# Patient Record
Sex: Female | Born: 1975 | Race: White | Hispanic: No | Smoking: Current every day smoker
Health system: Southern US, Community
[De-identification: ages and names within clinical notes are randomized; demographics above are authoritative.]

## PROBLEM LIST (undated history)

## (undated) DIAGNOSIS — M5000 Cervical disc disorder with myelopathy, unspecified cervical region: Secondary | ICD-10-CM

## (undated) DIAGNOSIS — J45909 Unspecified asthma, uncomplicated: Secondary | ICD-10-CM

## (undated) DIAGNOSIS — F419 Anxiety disorder, unspecified: Secondary | ICD-10-CM

## (undated) DIAGNOSIS — N3281 Overactive bladder: Secondary | ICD-10-CM

## (undated) DIAGNOSIS — I10 Essential (primary) hypertension: Secondary | ICD-10-CM

## (undated) DIAGNOSIS — L732 Hidradenitis suppurativa: Secondary | ICD-10-CM

## (undated) DIAGNOSIS — E559 Vitamin D deficiency, unspecified: Secondary | ICD-10-CM

## (undated) DIAGNOSIS — Z720 Tobacco use: Secondary | ICD-10-CM

## (undated) DIAGNOSIS — F329 Major depressive disorder, single episode, unspecified: Secondary | ICD-10-CM

## (undated) DIAGNOSIS — E538 Deficiency of other specified B group vitamins: Secondary | ICD-10-CM

## (undated) DIAGNOSIS — J449 Chronic obstructive pulmonary disease, unspecified: Secondary | ICD-10-CM

## (undated) DIAGNOSIS — R0600 Dyspnea, unspecified: Secondary | ICD-10-CM

## (undated) DIAGNOSIS — E785 Hyperlipidemia, unspecified: Secondary | ICD-10-CM

## (undated) DIAGNOSIS — Z8719 Personal history of other diseases of the digestive system: Secondary | ICD-10-CM

## (undated) DIAGNOSIS — K589 Irritable bowel syndrome without diarrhea: Secondary | ICD-10-CM

## (undated) DIAGNOSIS — K219 Gastro-esophageal reflux disease without esophagitis: Secondary | ICD-10-CM

## (undated) DIAGNOSIS — F32A Depression, unspecified: Secondary | ICD-10-CM

## (undated) HISTORY — DX: Anxiety disorder, unspecified: F41.9

## (undated) HISTORY — PX: CHOLECYSTECTOMY: SHX55

## (undated) HISTORY — PX: TUBAL LIGATION: SHX77

---

## 2000-11-22 ENCOUNTER — Emergency Department (HOSPITAL_COMMUNITY): Admission: EM | Admit: 2000-11-22 | Discharge: 2000-11-22 | Payer: Self-pay | Admitting: *Deleted

## 2002-05-10 ENCOUNTER — Emergency Department (HOSPITAL_COMMUNITY): Admission: EM | Admit: 2002-05-10 | Discharge: 2002-05-10 | Payer: Self-pay | Admitting: *Deleted

## 2002-12-09 ENCOUNTER — Ambulatory Visit (HOSPITAL_COMMUNITY): Admission: RE | Admit: 2002-12-09 | Discharge: 2002-12-09 | Payer: Self-pay | Admitting: Obstetrics and Gynecology

## 2003-01-11 ENCOUNTER — Emergency Department (HOSPITAL_COMMUNITY): Admission: EM | Admit: 2003-01-11 | Discharge: 2003-01-11 | Payer: Self-pay | Admitting: *Deleted

## 2003-03-02 ENCOUNTER — Ambulatory Visit (HOSPITAL_COMMUNITY): Admission: RE | Admit: 2003-03-02 | Discharge: 2003-03-02 | Payer: Self-pay | Admitting: Orthopaedic Surgery

## 2003-03-02 ENCOUNTER — Encounter: Payer: Self-pay | Admitting: Orthopaedic Surgery

## 2004-08-09 ENCOUNTER — Emergency Department (HOSPITAL_COMMUNITY): Admission: EM | Admit: 2004-08-09 | Discharge: 2004-08-09 | Payer: Self-pay | Admitting: Emergency Medicine

## 2004-10-02 ENCOUNTER — Emergency Department (HOSPITAL_COMMUNITY): Admission: EM | Admit: 2004-10-02 | Discharge: 2004-10-03 | Payer: Self-pay | Admitting: Emergency Medicine

## 2004-10-16 ENCOUNTER — Ambulatory Visit (HOSPITAL_COMMUNITY): Admission: RE | Admit: 2004-10-16 | Discharge: 2004-10-16 | Payer: Self-pay | Admitting: General Surgery

## 2006-02-02 ENCOUNTER — Emergency Department (HOSPITAL_COMMUNITY): Admission: EM | Admit: 2006-02-02 | Discharge: 2006-02-02 | Payer: Self-pay | Admitting: Emergency Medicine

## 2006-02-10 ENCOUNTER — Ambulatory Visit: Payer: Self-pay | Admitting: Gastroenterology

## 2006-02-13 ENCOUNTER — Ambulatory Visit (HOSPITAL_COMMUNITY): Admission: RE | Admit: 2006-02-13 | Discharge: 2006-02-13 | Payer: Self-pay | Admitting: Gastroenterology

## 2006-02-13 ENCOUNTER — Encounter (INDEPENDENT_AMBULATORY_CARE_PROVIDER_SITE_OTHER): Payer: Self-pay | Admitting: *Deleted

## 2006-02-13 ENCOUNTER — Ambulatory Visit: Payer: Self-pay | Admitting: Gastroenterology

## 2006-10-08 ENCOUNTER — Emergency Department (HOSPITAL_COMMUNITY): Admission: EM | Admit: 2006-10-08 | Discharge: 2006-10-08 | Payer: Self-pay | Admitting: Emergency Medicine

## 2007-02-18 ENCOUNTER — Emergency Department (HOSPITAL_COMMUNITY): Admission: EM | Admit: 2007-02-18 | Discharge: 2007-02-18 | Payer: Self-pay | Admitting: Emergency Medicine

## 2007-11-04 ENCOUNTER — Other Ambulatory Visit: Admission: RE | Admit: 2007-11-04 | Discharge: 2007-11-04 | Payer: Self-pay | Admitting: Obstetrics and Gynecology

## 2008-08-16 ENCOUNTER — Emergency Department (HOSPITAL_COMMUNITY): Admission: EM | Admit: 2008-08-16 | Discharge: 2008-08-16 | Payer: Self-pay | Admitting: Emergency Medicine

## 2010-06-20 ENCOUNTER — Emergency Department (HOSPITAL_COMMUNITY): Admission: EM | Admit: 2010-06-20 | Discharge: 2010-03-09 | Payer: Self-pay | Admitting: Emergency Medicine

## 2010-11-29 NOTE — Op Note (Signed)
[name], [name]                        ACCOUNT NO.:  [id number]   MEDICAL RECORD NO.:  [id number]                   PATIENT TYPE:  AMB   LOCATION:  DAY                                  FACILITY:  [facility]   PHYSICIAN:  Tilda Burrow, M.D.              DATE OF BIRTH:  1976/05/19   DATE OF PROCEDURE:  12/09/2002  DATE OF DISCHARGE:                                 OPERATIVE REPORT   PREOPERATIVE DIAGNOSIS:  Elective sterilization.   POSTOPERATIVE DIAGNOSIS:  Elective sterilization.   PROCEDURE:  Laparoscopic tubal sterilization with Falope rings.   SURGEON:  Christin Bach, M.D.   ASSISTANTRushie Nyhan, C.S.T.   ANESTHESIA:  General, [place]ac[place], C.R.N.A.   COMPLICATIONS:  None.   FINDINGS:  Normal tubes and ovaries bilaterally without evidence of  adhesions.   INDICATION:  Elective permanent sterilization.   DETAILS OF PROCEDURE:  The patient was taken to the operating room, prepped  and  draped for a combined abdominal and vaginal procedure, with Hulka  tenaculum attached to the cervix for uterine  manipulation. Bladder in-and-  out catheterization. An infraumbilical, 1 cm vertical incision, as well as a  transverse suprapubic 1 cm incision. Veress needle was used to introduce  pneumoperitoneum through the umbilical incision with the pneumoperitoneum  easily introduced under 10 mmHg of pressure. Introduction of the Veress  needle was done, carefully elevating the abdominal wall and orienting the  needle toward the pelvis.   The laparoscopic trocar was then carefully introduced into the abdomen using  a similar technique, and the laparoscope was used to visualize normal pelvic  anatomy with no evidence of bleeding or trauma. The suprapubic trocar was  introduced under direct visualization, and then attention was directed to  the left fallopian tube, which was identified up to its fimbriated end,  elevated and a mid-segment loop of the tube was drawn up into the  Falope  ring applier, Marcaine 0.25% applied to the surface of the tube and the  Falope ring applied, inspected, and found to be in satisfactory position.  The opposite tube was then treated in a similar fashion. The mesosalpinx  beneath the Falope ring on each side was then infiltrated with approximately  3 cc of Marcaine 0.25%, using a transabdominal approach with a 22-gauge  spinal needle. Then the laparoscopic equipment was removed after instilling  200 cc of saline into  the abdomen and deflating the abdomen. Subcuticular 4-0 Dexon was used to  close the skin and Steri-Strips were placed on the skin surface. Sponge and  needle counts were correct. The patient tolerated the procedure well, was  awakened, and went to the recovery room in good condition.  Tilda Burrow, M.D.    JVF/MEDQ  D:  12/09/2002  T:  12/09/2002  Job:  [id number]

## 2010-11-29 NOTE — Consult Note (Signed)
NAMEALDINA, [name]              ACCOUNT NO.:  [id number]   MEDICAL RECORD NO.:  [id number]          PATIENT TYPE:  AMB   LOCATION:                                FACILITY:  [facility]   PHYSICIAN:  Kassie Mends, M.D.      DATE OF BIRTH:  11/22/1975   DATE OF CONSULTATION:  02/10/2006  DATE OF DISCHARGE:                                   CONSULTATION   Dear Dr. Nobie Putnam:   I am seeing [name] as a new patient consultation per your request.  I am  seeing her for diarrhea, bloating, and heartburn.   [name] is a 35 year old female who has had heartburn since at least  1993.  She took Prevacid for 2 years and then her symptoms went away.  Within the last year she has developed heartburn again which she initially  treated with Zantac.  Her burning sensation in her chest was so bad that it  made her cry so she sought medical attention.  She reports being tested for  H. pylori and her serology was positive.  She began to take the amoxicillin  and Biaxin but a CT scan was obtained and showed a circumferential  thickening of the cecal wall, a normal TI, a normal appendix, and normal  pericecal fat.  On July 17 her medications for H. pylori were discontinued  and stool cultures for O&P, Gram stain, and C. difficile were obtained.  She  was placed on Flagyl 500 mg three times a day and finished her last dose  within the last 2-3 days.   She has gained approximately 40 pounds in the last year.  She complains of a  pressure in her stomach and feeling bloated.  She now has pain in the left  side of her back and complains of dizziness and feeling off-balance.  She  started on omeprazole recently and since initiating that medicine she has no  heartburn.  She did try to discontinue the omeprazole for a couple of days  but her symptoms returned and so now she remains on it daily.  She rarely  has nausea and may vomit one to two times a week.  Phenergan has helped.  She has seen no blood in her  vomit.  She has no sick contacts.   Her watery stools have been a problem since her cholecystectomy last year.  Initially she had watery stool occasionally and now she has watery stool all  the time.  She feels like it flushes right through her.  She has three to  five bowel movements a day.  She denies any bloody diarrhea.  She does have  lower crampy abdominal pain one to two times a week.  She lies still and it  goes away.  She denies any travel, well water, fresh water consumption,  antibiotics prior to July 2007, and has lumpy to watery stool regularly.  She denies any formed stool.  She is concerned about the fact that her  weight continues to increase.   Her past medical history includes depression and anxiety.  Her past surgical  history includes tubal ligation.  She is not allergic to any medicines.  She  is taking promethazine as needed, Xanax, Celexa, Lortab as needed, and  omeprazole.  She has no family history of colon, uterine, breast, or ovarian  cancer.  She has no family history of colon polyps or diarrheal illnesses.  She is single with three children.  Her children are age 14, 26, and 88.  She  is employed at [employer] and is currently being held out of work because  of her various medical problems.  She smokes two packs a day.  She denies  any alcohol use.  Her review of systems is per the HPI; otherwise, all  systems are negative.   On physical exam, her weight is 201 pounds, height 5 feet 4 inches, body  mass index 34.5 (obese).  Temperature 98, blood pressure 118/62, pulse 78.  In general, she is in no apparent distress, alert and oriented x4.  HEENT  exam is atraumatic, normocephalic.  Pupils equal and reactive to light.  Mouth:  No oral lesions.  Posterior pharynx without erythema or exudate,  multiple dental caries.  Her neck has full range of motion and no  lymphadenopathy.  Her lungs are clear to auscultation bilaterally.  Her  cardiovascular exam is a  regular rhythm, no murmur, normal S1 and S2.  Her  abdomen, bowel sounds are present, soft, nontender, nondistended.  No  rebound or guarding.  No hepatosplenomegaly, obese.  Her extremities are  without cyanosis, clubbing or edema.  She has no focal neurologic deficits.   [name] is a 36 year old female with gastroesophageal reflux disease  whose symptoms are now controlled on omeprazole once daily.  Her diarrhea,  bloating and crampy abdominal pain are likely due to irritable bowel.  The  differential diagnosis includes celiac sprue and a low likelihood of  inflammatory bowel disease or infectious colitis.  She does have a cecal  wall thickening on CT scan and that warrants further investigation.  Thank  you for allowing me to see [name] in consultation.  My recommendations  follow.   I recommend continuing the omeprazole.  She should add calcium carbonate two  with meals, maximum eight per day for post cholecystectomy diarrhea.  She is  to try this for 2 weeks and if her watery stools are not improved, then she  should add Levsin 0.125 mg sublingual 30 minutes before meals and may repeat  every 4 hours.  She is instructed not to use more than 10 pills a day.  Medication warnings were discussed.  She is given and handout and asked to  comply with a lactose-free high-fiber diet.  She is asked to drink only  sugar-free liquid.  She may follow up in 3 months.   Please feel free to contact me at [phone] with additional questions.      Kassie Mends, M.D.  Electronically Signed     SM/MEDQ  D:  02/10/2006  T:  02/10/2006  Job:  [id number]   cc:   Patrica Duel, M.D.  Fax: [phone]

## 2010-11-29 NOTE — Op Note (Signed)
NAMELESLI, ISSA              ACCOUNT NO.:  [id number]   MEDICAL RECORD NO.:  [id number]          PATIENT TYPE:  AMB   LOCATION:  DAY                           FACILITY:  [facility]   PHYSICIAN:  Kassie Mends, M.D.      DATE OF BIRTH:  [birth year]   DATE OF PROCEDURE:  02/13/2006  DATE OF DISCHARGE:                                 OPERATIVE REPORT   REFERRING PHYSICIAN:  Patrica Duel, M.D.   PROCEDURE:  Colonoscopy with cold forceps biopsy.   MEDICATIONS:  1. Demerol 125 mg IV.  2. Versed 8 mg IV.  3. Phenergan 25 mg IV.   INDICATIONS FOR EXAM:  [name] is a 35 year old female who has abdominal  pain, bloating and diarrhea.  CT scan was obtained which showed a thickened  cecum and cecal wall.  She presents for evaluation for abnormal CT findings.   FINDINGS:  1. Normal terminal ileum and normal cecum without evidence of erythema,      mass, or polyps.  Otherwise, normal colon without evidence of polyps,      masses, inflammatory changes or vascular ectasia.  Random colon      biopsies were taken to evaluate for lymphocytic collagenous or      microscopic colitis.  2. Normal retroflexed you of the rectum.   RECOMMENDATIONS:  1. Follow up biopsies.  2. Screening colonoscopy in 20 years.  3. Follow up with Dr. Kassie Mends in three months.  Encourage the patient      to adhere to the two Tums with meals to alleviate any component of post      cholecystectomy diarrhea.  She is instructed to use the Levsin if the      Tums does not work.   PROCEDURE TECHNIQUE:  Physical exam was performed and informed consent was  obtained from the patient after explaining all risks, benefits and  alternatives to the procedure which the patient appeared to understand and  so stated.  The patient was connected to the monitoring device and placed in  the left lateral position.  Continuous oxygen was provided by nasal cannula  and IV medicine administered through an indwelling cannula.  After  administration of sedation and rectal exam, the patient's rectum was  intubated and the scope was advanced under  direct visualization to the cecum.  The scope was subsequently removed  slowly by carefully examining the color, texture, anatomy and integrity of  the mucosa on the way out.  The patient was recovered in the endoscopy suite  and discharged home in satisfactory condition.      Kassie Mends, M.D.  Electronically Signed     SM/MEDQ  D:  02/13/2006  T:  02/13/2006  Job:  [id number]   cc:   Patrica Duel, M.D.  Fax: [phone]

## 2010-11-29 NOTE — H&P (Signed)
   NAME:  [name], [name]                        ACCOUNT NO.:  [id number]   MEDICAL RECORD NO.:  [id number]                   PATIENT TYPE:  AMB   LOCATION:  DAY                                  FACILITY:  [facility]   PHYSICIAN:  Tilda Burrow, M.D.              DATE OF BIRTH:  January 07, 1976   DATE OF ADMISSION:  DATE OF DISCHARGE:                                HISTORY & PHYSICAL   ADMITTING DIAGNOSIS:  Desire for elective permanent sterilization.   HISTORY OF PRESENT ILLNESS:  This 35 year old female gravida 3, para 2,  vaginal delivery times three, with last menstrual period occurring in late  April is admitted at this time for elective permanent sterilization.  The  patient was seen in our office Nov 23, 2002, counselled extensively of  options and desires elective sterilization using Falope rings.  This has  been explained using [employer] instructional booklets regarding sterilization.  Reviewed specific risks.  Options and alternatives have been discussed using  the booklet at the visual guide.  Failure rate of 1/100 has been  specifically quoted to the patient for this method.  Husband has declined  consideration of vasectomy.  She has been in a stable relationship for 4.5  years.  Her youngest child in 91 years old.   PAST MEDICAL HISTORY:  Benign.   PAST SURGICAL HISTORY:  Negative.   ALLERGIES:  None.   PHYSICAL EXAMINATION:  GENERAL EXAMINATION:  Shows a healthy-appearing  Caucasian female, alert and oriented times tree.  HEENT:  Pupils equal, round and react.  Extraocular movements intact.  NECK:  Supple.  Trachea midline.  CHEST:  Clear to auscultation.  ABDOMEN: Nontender.  EXTREMITIES:  No edema.  PELVIC:  Vaginal exam shows physiologic secretions.  Cervix multipara  without abnormalities.  Uterus anteflex, mobile.  Adnexa negative for  masses.   ASSESSMENT:  Desire for elective sterilization.   PLAN:  Laparoscopic tubal sterilization, Falope rings, Dec 09, 2002.                                                  Tilda Burrow, M.D.   JVF/MEDQ  D:  12/08/2002  T:  12/08/2002  Job:  [id number]

## 2010-11-29 NOTE — H&P (Signed)
   [name], [name]                        ACCOUNT NO.:  [id number]   MEDICAL RECORD NO.:  [id number]                   PATIENT TYPE:  AMB   LOCATION:  DAY                                  FACILITY:  [facility]   PHYSICIAN:  Tilda Burrow, M.D.              DATE OF BIRTH:  1976/03/14   DATE OF ADMISSION:  12/09/2002  DATE OF DISCHARGE:                                HISTORY & PHYSICAL   ADMISSION DIAGNOSIS:  Desire for elective permanent sterilization.   HISTORY OF PRESENT ILLNESS:  This 35 year old female gravida 3, para 3 with  the youngest child three years old with a stable relationship times four a  half years with no desire for future childbearing under any circumstance who  is admitted for elective permanent sterilization.  She has been seen in our  office on Nov 23, 2002 and counseled over the plans and alternatives and  desires to proceed with surgery.  She has been seen where an exam shows an  uncomplicated course with the patient scheduled for surgery this Friday.   PAST MEDICAL HISTORY:  Benign.   PAST SURGICAL HISTORY:  Negative.   ALLERGIES:  None.   PHYSICAL EXAMINATION:  VITAL SIGNS:  Height is 5 feet 5 inches, weight 173.  GENERAL EXAM: Alert and oriented Caucasian female.  HEENT: Pupils equal, round and reactive.  Extraocular movements are intact.  The neck is supple.  Trachea is midline.  CHEST:  Clear to auscultation.  ABDOMEN:  Nontender.  PELVIC EXAM:  External genitalia normal.  Cervix is multiparous.  Uterus is  anterior, normal in contour.  Adnexae are negative.   PLAN:  Laparoscopic tubal sterilization with Falope rings on Dec 09, 2002.                                               Tilda Burrow, M.D.    JVF/MEDQ  D:  12/07/2002  T:  12/07/2002  Job:  [id number]

## 2010-11-29 NOTE — H&P (Signed)
[name], SCHLAUCH              ACCOUNT NO.:  [id number]   MEDICAL RECORD NO.:  [id number]          PATIENT TYPE:  AMB   LOCATION:                                 FACILITY:   PHYSICIAN:  Dalia Heading, M.D.  DATE OF BIRTH:  [birth year]   DATE OF ADMISSION:  DATE OF DISCHARGE:  LH                                HISTORY & PHYSICAL   CHIEF COMPLAINT:  Biliary colic, cholelithiasis.   HISTORY OF PRESENT ILLNESS:  The patient is a 35 year old white female who  is referred for evaluation and treatment of biliary colic secondary to  cholelithiasis.  She has been having right upper quadrant abdominal pain  with radiation to the right flank, nausea and bloating for many months.  She  does have fatty food intolerance.  No fever, chills or jaundice have been  noted.   PAST MEDICAL HISTORY:  Unremarkable.   PAST SURGICAL HISTORY:  Tubal ligation.   CURRENT MEDICATIONS:  1.  Reglan.  2. Vicodin.   ALLERGIES:  No known drug allergies.   REVIEW OF SYSTEMS:  The patient does smoke a pack of cigarettes a day.  She  denies any significant alcohol use.  She denies any other cardiopulmonary  difficulties or bleeding disorders.   PHYSICAL EXAMINATION:  The patient is a well-developed, well-nourished white  female in no acute distress.  She is afebrile and vital signs are stable.  HEENT:  Examination reveals no scleral icterus.  LUNGS:  Clear to auscultation with equal breath sounds bilaterally.  HEART:  Examination reveals a regular rate and rhythm without S3, S4 or  murmurs.  ABDOMEN:  Soft and nondistended.  She is tender in the right upper quadrant  to palpation.  No hepatosplenomegaly, masses or hernias are identified.  Ultrasound of the gallbladder and abdomen reveal cholelithiasis.  A right  ovarian cyst which is 2.5 cm in its greatest diameter is also noted.   IMPRESSION:  1.  Cholecystitis, cholelithiasis.  2.  Right ovarian cyst.   PLAN:  The patient is scheduled for a  laparoscopic cholecystectomy on October 16, 2004.  The risks and the benefits of the procedure including bleeding,  infection, hepatobiliary injury, the possibility of an open procedure were  fully explained to the patient, gave informed consent.  She will be seeing  Dr. Emelda Fear of gynecology Monday.  If he desires, we can look at the right  ovary at the time of surgery.      MAJ/MEDQ  D:  10/10/2004  T:  10/10/2004  Job:  [id number]   cc:   Tilda Burrow, M.D.  [address] [place]  [place] [address]  Fax: [phone]

## 2010-11-29 NOTE — Op Note (Signed)
[name], WEIER              ACCOUNT NO.:  [id number]   MEDICAL RECORD NO.:  [id number]          PATIENT TYPE:  AMB   LOCATION:  DAY                           FACILITY:  [facility]   PHYSICIAN:  Dalia Heading, M.D.  DATE OF BIRTH:  1976/05/15   DATE OF PROCEDURE:  10/16/2004  DATE OF DISCHARGE:                                 OPERATIVE REPORT   PREOPERATIVE DIAGNOSES:  1.  Cholecystitis.  2.  Cholelithiasis.   POSTOPERATIVE DIAGNOSES:  1.  Cholecystitis.  2.  Cholelithiasis.   PROCEDURE:  Laparoscopic cholecystectomy.   SURGEON:  Dalia Heading, M.D.   ASSISTANT:  Bernerd Limbo. Leona Carry, M.D.   ANESTHESIA:  General endotracheal.   INDICATIONS:  The patient is a 35 year old white female who is referred for  evaluation and treatment of biliary colic secondary to cholelithiasis.  The  risks and benefits of the procedure, including bleeding, infection,  hepatobiliary injury and the possibility of an open procedure were fully  explained to the patient, who gave informed consent.   DESCRIPTION OF PROCEDURE:  The patient was placed in the supine position.  After induction of general endotracheal anesthesia, the abdomen was prepped  and draped using the usual sterile technique with Betadine.  Surgical site  confirmation was performed.   A supraumbilical incision was made down to the fascia.  A Veress needle was  introduced into the abdominal cavity and confirmation of placement was done  using the saline drop test.  The abdomen was then insufflated to 16 mmHg  pressure.  An 11 mm trocar was introduced into the abdominal cavity under  direct visualization without difficulty.  The patient was placed in reverse  Trendelenburg position.  An additional 11 mm trocar was placed in the  epigastric region and 5 mm trocars were placed in the right upper quadrant  and right flank regions.  The liver was inspected and noted to be within  normal limits.  The gallbladder was retracted  superiorly and laterally.  The  dissection was begun around the infundibulum of the gallbladder.  The cystic  duct was first identified.  Its juncture to the infundibulum was fully  identified.  Endoclips were placed proximally and distally on the cystic  duct and the cystic duct was divided.  This was likewise down to the cystic  artery.  The gallbladder was then freed away from the gallbladder fossa  using Bovie electrocautery.  The gallbladder was delivered through the  epigastric trocar site using an EndoCatch bag.  The gallbladder was then  freed away from the gallbladder fossa using Bovie electrocautery.  The  gallbladder was delivered through the epigastric trocar site using EndoCatch  bag.  The gallbladder fossa was inspected and no abnormal bleeding or bile  leakage was noted.  Surgicel was placed in the gallbladder fossa.  All fluid  and air were then evacuated from the abdominal cavity prior to removal of  the trocars.   All wounds were irrigated with normal saline.  All wounds were injected with  0.5% Sensorcaine.  The supraumbilical fascia was reapproximated using an 0  Vicryl  interrupted suture.  All skin incisions were closed using staples.  Betadine ointment and dry sterile dressings were applied.   All tape and needle counts correct at the end of the procedure.  The patient  was extubated in the operating room and went back to the recovery room awake  and in stable condition.   COMPLICATIONS:  None.   SPECIMENS:  Gallbladder with stones.   BLOOD LOSS:  Minimal.      MAJ/MEDQ  D:  10/16/2004  T:  10/16/2004  Job:  [id number]   cc:   Tilda Burrow, M.D.  [address] [place]  [place] [address]  Fax: [phone]

## 2012-09-02 ENCOUNTER — Encounter (HOSPITAL_COMMUNITY): Payer: Self-pay | Admitting: Emergency Medicine

## 2012-09-02 ENCOUNTER — Emergency Department (HOSPITAL_COMMUNITY)
Admission: EM | Admit: 2012-09-02 | Discharge: 2012-09-02 | Disposition: A | Discharge status: Home / Self Care | Payer: Self-pay | Attending: Emergency Medicine | Admitting: Emergency Medicine

## 2012-09-02 DIAGNOSIS — M545 Low back pain, unspecified: Secondary | ICD-10-CM

## 2012-09-02 DIAGNOSIS — W1809XA Striking against other object with subsequent fall, initial encounter: Secondary | ICD-10-CM | POA: Insufficient documentation

## 2012-09-02 DIAGNOSIS — F1210 Cannabis abuse, uncomplicated: Secondary | ICD-10-CM | POA: Insufficient documentation

## 2012-09-02 DIAGNOSIS — IMO0002 Reserved for concepts with insufficient information to code with codable children: Secondary | ICD-10-CM | POA: Insufficient documentation

## 2012-09-02 DIAGNOSIS — Y9289 Other specified places as the place of occurrence of the external cause: Secondary | ICD-10-CM | POA: Insufficient documentation

## 2012-09-02 DIAGNOSIS — Z79899 Other long term (current) drug therapy: Secondary | ICD-10-CM | POA: Insufficient documentation

## 2012-09-02 DIAGNOSIS — F17200 Nicotine dependence, unspecified, uncomplicated: Secondary | ICD-10-CM | POA: Insufficient documentation

## 2012-09-02 DIAGNOSIS — Y939 Activity, unspecified: Secondary | ICD-10-CM | POA: Insufficient documentation

## 2012-09-02 DIAGNOSIS — W010XXA Fall on same level from slipping, tripping and stumbling without subsequent striking against object, initial encounter: Secondary | ICD-10-CM | POA: Insufficient documentation

## 2012-09-02 MED ORDER — DIAZEPAM 5 MG PO TABS: 5.0000 mg | ORAL_TABLET | Freq: Three times a day (TID) | ORAL | Status: DC | PRN

## 2012-09-02 MED ORDER — DIAZEPAM 5 MG PO TABS
5.0000 mg | ORAL_TABLET | Freq: Once | ORAL | Status: AC
  Administered 2012-09-02: 5 mg via ORAL
  Filled 2012-09-02: qty 1

## 2012-09-02 MED ORDER — HYDROMORPHONE HCL PF 1 MG/ML IJ SOLN
1.0000 mg | Freq: Once | INTRAMUSCULAR | Status: AC
  Administered 2012-09-02: 1 mg via INTRAMUSCULAR
  Filled 2012-09-02: qty 1

## 2012-09-02 MED ORDER — NAPROXEN 375 MG PO TABS: 375.0000 mg | ORAL_TABLET | Freq: Two times a day (BID) | ORAL | Status: DC | PRN

## 2012-09-02 MED ORDER — IBUPROFEN 400 MG PO TABS
600.0000 mg | ORAL_TABLET | Freq: Once | ORAL | Status: AC
  Administered 2012-09-02: 600 mg via ORAL
  Filled 2012-09-02: qty 3

## 2012-09-02 MED ORDER — OXYCODONE-ACETAMINOPHEN 5-325 MG PO TABS: 1.0000 | ORAL_TABLET | ORAL | Status: DC | PRN

## 2012-09-02 NOTE — ED Notes (Signed)
Pt fell in snow x 2 days ago. Pt c/o L side lower back and into buttocks and leg. Unable to sit down due to pain. States Left leg "gave out" yesterday and fell. Has abrasions to r lower leg. otc meds with out relief

## 2012-09-02 NOTE — ED Provider Notes (Signed)
History    37 year old female with left lower back pain. Patient mechanical fall when she slipped on the snow 2 days ago. Delayed onset of pain which has progressively worsened. Pain is in the left lower back and left buttock. Feels better standing and moving her L leg. No numbness, tingling or loss of strength. Has been taking BC powder with minimal relief. No urinary complaints. No history of any neck or back surgery. No use of blood thinning medication.  CSN: [id number]  Arrival date & time 09/02/12  1014   First MD Initiated Contact with Patient 09/02/12 1317      Chief Complaint  Patient presents with  . Fall    (Consider location/radiation/quality/duration/timing/severity/associated sxs/prior treatment) HPI  History reviewed. No pertinent past medical history.  Past Surgical History  Procedure Laterality Date  . Cholecystectomy    . Tubal ligation      History reviewed. No pertinent family history.  History  Substance Use Topics  . Smoking status: Current Every Day Smoker  . Smokeless tobacco: Not on file  . Alcohol Use: Yes     Comment: twice a month    OB History   Grav Para Term Preterm Abortions TAB SAB Ect Mult Living                  Review of Systems  All systems reviewed and negative, other than as noted in HPI.   Allergies  Review of patient's allergies indicates no known allergies.  Home Medications   Current Outpatient Rx  Name  Route  Sig  Dispense  Refill  . acetaminophen (TYLENOL) 500 MG tablet   Oral   Take 1,000 mg by mouth every 6 (six) hours as needed for pain.         . Lansoprazole (PREVACID PO)   Oral   Take 1 tablet by mouth daily.         . naproxen sodium (ALEVE) 220 MG tablet   Oral   Take 440 mg by mouth daily as needed (pain).         . diazepam (VALIUM) 5 MG tablet   Oral   Take 1 tablet (5 mg total) by mouth every 8 (eight) hours as needed (muscle spasm).   10 tablet   0   . naproxen (NAPROSYN) 375 MG  tablet   Oral   Take 1 tablet (375 mg total) by mouth 2 (two) times daily as needed.   20 tablet   0   . oxyCODONE-acetaminophen (PERCOCET/ROXICET) 5-325 MG per tablet   Oral   Take 1-2 tablets by mouth every 4 (four) hours as needed for pain.   12 tablet   0     BP 133/101  Pulse 100  Temp(Src) 98.1 F (36.7 C) (Oral)  Resp 18  SpO2 99%  LMP 09/01/2012  Physical Exam  Nursing note and vitals reviewed. Constitutional: She appears well-developed and well-nourished. No distress.  HENT:  Head: Normocephalic and atraumatic.  Eyes: Conjunctivae are normal. Right eye exhibits no discharge. Left eye exhibits no discharge.  Neck: Neck supple.  Cardiovascular: Normal rate, regular rhythm and normal heart sounds.  Exam reveals no gallop and no friction rub.   No murmur heard. Pulmonary/Chest: Effort normal and breath sounds normal. No respiratory distress.  Abdominal: Soft. She exhibits no distension. There is no tenderness.  Musculoskeletal: She exhibits no edema and no tenderness.       Back:  Tenderness in depicted areas. No overlying skin changes. No  midline spinal tenderness  Neurological: She is alert. She exhibits normal muscle tone. Coordination normal.  Strength 5/5 b/l LE  Skin: Skin is warm and dry.  Psychiatric: She has a normal mood and affect. Her behavior is normal. Thought content normal.    ED Course  Procedures (including critical care time)  Labs Reviewed - No data to display No results found.   1. Lower back pain       MDM  37 year old female with left lower back pain after mechanical fall 2 days ago. Delayed onset of pain. Suspect contusion/strain. Very low suspicion for fracture. Nonfocal neurological examination. No indication for emergent imaging. Plan symptomatic treatment. Emergent return precautions were discussed. Outpatient followup as needed otherwise.        Raeford Razor, MD 09/02/12 1620

## 2012-09-13 ENCOUNTER — Encounter (HOSPITAL_COMMUNITY): Payer: Self-pay | Admitting: Emergency Medicine

## 2012-09-13 ENCOUNTER — Emergency Department (HOSPITAL_COMMUNITY)
Admission: EM | Admit: 2012-09-13 | Discharge: 2012-09-13 | Disposition: A | Discharge status: Home / Self Care | Payer: Self-pay | Attending: Emergency Medicine | Admitting: Emergency Medicine

## 2012-09-13 ENCOUNTER — Emergency Department (HOSPITAL_COMMUNITY): Payer: Self-pay

## 2012-09-13 DIAGNOSIS — S8990XA Unspecified injury of unspecified lower leg, initial encounter: Secondary | ICD-10-CM | POA: Insufficient documentation

## 2012-09-13 DIAGNOSIS — Y929 Unspecified place or not applicable: Secondary | ICD-10-CM | POA: Insufficient documentation

## 2012-09-13 DIAGNOSIS — Y9389 Activity, other specified: Secondary | ICD-10-CM | POA: Insufficient documentation

## 2012-09-13 DIAGNOSIS — F17200 Nicotine dependence, unspecified, uncomplicated: Secondary | ICD-10-CM | POA: Insufficient documentation

## 2012-09-13 DIAGNOSIS — M25562 Pain in left knee: Secondary | ICD-10-CM

## 2012-09-13 DIAGNOSIS — Z79899 Other long term (current) drug therapy: Secondary | ICD-10-CM | POA: Insufficient documentation

## 2012-09-13 DIAGNOSIS — W1809XA Striking against other object with subsequent fall, initial encounter: Secondary | ICD-10-CM | POA: Insufficient documentation

## 2012-09-13 DIAGNOSIS — Z791 Long term (current) use of non-steroidal anti-inflammatories (NSAID): Secondary | ICD-10-CM | POA: Insufficient documentation

## 2012-09-13 MED ORDER — IBUPROFEN 800 MG PO TABS: 800.0000 mg | ORAL_TABLET | Freq: Three times a day (TID) | ORAL | Status: DC

## 2012-09-13 MED ORDER — IBUPROFEN 400 MG PO TABS
800.0000 mg | ORAL_TABLET | Freq: Once | ORAL | Status: AC
  Administered 2012-09-13: 800 mg via ORAL
  Filled 2012-09-13: qty 2

## 2012-09-13 NOTE — Progress Notes (Signed)
Orthopedic Tech Progress Note Patient Details:  [name] 12/21/1975 [id number]  Ortho Devices Type of Ortho Device: Knee Sleeve Ortho Device/Splint Location: left knee support Ortho Device/Splint Interventions: Application   Cammer, Mickie Bail 09/13/2012, 10:19 AM
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
## 2012-09-13 NOTE — ED Provider Notes (Signed)
Medical screening examination/treatment/procedure(s) were performed by non-physician practitioner and as supervising physician I was immediately available for consultation/collaboration.   David H Yao, MD 09/13/12 1609 

## 2012-09-13 NOTE — ED Notes (Signed)
Ortho called to come placed knee sleeve.

## 2012-09-18 ENCOUNTER — Encounter (HOSPITAL_COMMUNITY): Payer: Self-pay | Admitting: *Deleted

## 2012-09-18 ENCOUNTER — Emergency Department (HOSPITAL_COMMUNITY)
Admission: EM | Admit: 2012-09-18 | Discharge: 2012-09-18 | Disposition: A | Discharge status: Home / Self Care | Payer: Self-pay | Attending: Emergency Medicine | Admitting: Emergency Medicine

## 2012-09-18 DIAGNOSIS — R209 Unspecified disturbances of skin sensation: Secondary | ICD-10-CM | POA: Insufficient documentation

## 2012-09-18 DIAGNOSIS — M25569 Pain in unspecified knee: Secondary | ICD-10-CM | POA: Insufficient documentation

## 2012-09-18 DIAGNOSIS — M25562 Pain in left knee: Secondary | ICD-10-CM

## 2012-09-18 DIAGNOSIS — F17200 Nicotine dependence, unspecified, uncomplicated: Secondary | ICD-10-CM | POA: Insufficient documentation

## 2012-09-18 DIAGNOSIS — G8911 Acute pain due to trauma: Secondary | ICD-10-CM | POA: Insufficient documentation

## 2012-09-18 MED ORDER — HYDROCODONE-ACETAMINOPHEN 5-325 MG PO TABS: 1.0000 | ORAL_TABLET | ORAL | Status: DC | PRN

## 2012-09-18 MED ORDER — IBUPROFEN 600 MG PO TABS: 600.0000 mg | ORAL_TABLET | Freq: Four times a day (QID) | ORAL | Status: DC | PRN

## 2012-09-18 MED ORDER — HYDROCODONE-ACETAMINOPHEN 5-325 MG PO TABS
1.0000 | ORAL_TABLET | Freq: Once | ORAL | Status: AC
  Administered 2012-09-18: 1 via ORAL
  Filled 2012-09-18: qty 1

## 2012-09-18 NOTE — ED Notes (Signed)
Pt c/o pain to left knee, has had problems with her knee before, pain became worse this past Monday, was seen in er at [facility] and advised to follow up with ortho, has not been able to see orthopedic dr yet. Pt states that the pain has gotten worse and she is starting to feel numbness in left knee area.
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
## 2012-09-22 NOTE — ED Provider Notes (Signed)
Medical screening examination/treatment/procedure(s) were performed by non-physician practitioner and as supervising physician I was immediately available for consultation/collaboration.   Shelda Jakes, MD 09/22/12 [phone]

## 2012-10-13 ENCOUNTER — Other Ambulatory Visit (HOSPITAL_COMMUNITY): Payer: Self-pay | Admitting: Orthopedic Surgery

## 2012-10-13 DIAGNOSIS — M25562 Pain in left knee: Secondary | ICD-10-CM

## 2012-10-18 ENCOUNTER — Ambulatory Visit (HOSPITAL_COMMUNITY)
Admission: RE | Admit: 2012-10-18 | Discharge: 2012-10-18 | Disposition: A | Discharge status: Home / Self Care | Payer: Self-pay | Source: Ambulatory Visit | Attending: Orthopedic Surgery | Admitting: Orthopedic Surgery

## 2013-01-15 ENCOUNTER — Emergency Department (HOSPITAL_COMMUNITY)
Admission: EM | Admit: 2013-01-15 | Discharge: 2013-01-15 | Disposition: A | Discharge status: Home / Self Care | Payer: Self-pay | Attending: Emergency Medicine | Admitting: Emergency Medicine

## 2013-01-15 ENCOUNTER — Encounter (HOSPITAL_COMMUNITY): Payer: Self-pay | Admitting: *Deleted

## 2013-01-15 ENCOUNTER — Emergency Department (HOSPITAL_COMMUNITY): Payer: Self-pay

## 2013-01-15 DIAGNOSIS — F17200 Nicotine dependence, unspecified, uncomplicated: Secondary | ICD-10-CM | POA: Insufficient documentation

## 2013-01-15 DIAGNOSIS — R296 Repeated falls: Secondary | ICD-10-CM | POA: Insufficient documentation

## 2013-01-15 DIAGNOSIS — Y9339 Activity, other involving climbing, rappelling and jumping off: Secondary | ICD-10-CM | POA: Insufficient documentation

## 2013-01-15 DIAGNOSIS — S8990XA Unspecified injury of unspecified lower leg, initial encounter: Secondary | ICD-10-CM | POA: Insufficient documentation

## 2013-01-15 DIAGNOSIS — M25572 Pain in left ankle and joints of left foot: Secondary | ICD-10-CM

## 2013-01-15 DIAGNOSIS — Y929 Unspecified place or not applicable: Secondary | ICD-10-CM | POA: Insufficient documentation

## 2013-01-15 MED ORDER — ACETAMINOPHEN 500 MG PO TABS
1000.0000 mg | ORAL_TABLET | Freq: Once | ORAL | Status: AC
  Administered 2013-01-15: 1000 mg via ORAL
  Filled 2013-01-15: qty 2

## 2013-01-15 NOTE — ED Notes (Signed)
Pt states she hurt her left ankle this morning

## 2013-01-15 NOTE — ED Provider Notes (Signed)
History    CSN: [id number] Arrival date & time 01/15/13  0546  First MD Initiated Contact with Patient 01/15/13 [phone]     Chief Complaint  Patient presents with  . Ankle Pain   (Consider location/radiation/quality/duration/timing/severity/associated sxs/prior Treatment) HPI HPI Comments: [name] is a 37 y.o. female who presents to the Emergency Department complaining of left ankle pain after climbing on a dumpster to empty trash and falling onto her left ankle. She has two children with her. The left ankle is slightly swollen at the lateral malleolar area. She has taken no medicines.   History reviewed. No pertinent past medical history. Past Surgical History  Procedure Laterality Date  . Cholecystectomy    . Tubal ligation     No family history on file. History  Substance Use Topics  . Smoking status: Current Every Day Smoker  . Smokeless tobacco: Not on file  . Alcohol Use: No     Comment: twice a month   OB History   Grav Para Term Preterm Abortions TAB SAB Ect Mult Living                 Review of Systems  Constitutional: Negative for fever.       10 Systems reviewed and are negative for acute change except as noted in the HPI.  HENT: Negative for congestion.   Eyes: Negative for discharge and redness.  Respiratory: Negative for cough and shortness of breath.   Cardiovascular: Negative for chest pain.  Gastrointestinal: Negative for vomiting and abdominal pain.  Musculoskeletal: Negative for back pain.       Left ankle pain  Skin: Negative for rash.  Neurological: Negative for syncope, numbness and headaches.  Psychiatric/Behavioral:       No behavior change.    Allergies  Other  Home Medications   Current Outpatient Rx  Name  Route  Sig  Dispense  Refill  . acetaminophen (TYLENOL) 500 MG tablet   Oral   Take 1,000 mg by mouth every 6 (six) hours as needed for pain.         Marland Kitchen Alum Hydroxide-Mag Carbonate (HEARTBURN ANTACID EX ST  PO)   Oral   Take 1-2 tablets by mouth daily.         Marland Kitchen ibuprofen (ADVIL,MOTRIN) 800 MG tablet   Oral   Take 1 tablet (800 mg total) by mouth 3 (three) times daily.   21 tablet   0   . naproxen sodium (ALEVE) 220 MG tablet   Oral   Take 440 mg by mouth daily as needed (pain).         Marland Kitchen HYDROcodone-acetaminophen (NORCO/VICODIN) 5-325 MG per tablet   Oral   Take 1 tablet by mouth every 4 (four) hours as needed for pain.   15 tablet   0   . ibuprofen (ADVIL,MOTRIN) 600 MG tablet   Oral   Take 1 tablet (600 mg total) by mouth every 6 (six) hours as needed for pain.   30 tablet   0    BP 148/104  Pulse 89  Temp(Src) 97.9 F (36.6 C) (Oral)  Resp 20  Ht 5\' 4"  (1.626 m)  Wt 156 lb (70.761 kg)  BMI 26.76 kg/m2  SpO2 98%  LMP 01/15/2013 Physical Exam  Nursing note and vitals reviewed. Constitutional: She appears well-developed and well-nourished.  Awake, alert, nontoxic appearance.  HENT:  Head: Normocephalic and atraumatic.  Eyes: Right eye exhibits no discharge. Left eye exhibits no discharge.  Neck:  Neck supple.  Cardiovascular: Normal rate and intact distal pulses.   Pulmonary/Chest: Effort normal and breath sounds normal. She exhibits no tenderness.  Abdominal: Soft. Bowel sounds are normal. There is no tenderness. There is no rebound.  Musculoskeletal: She exhibits no tenderness.  Baseline ROM, no obvious new focal weakness.left ankle with mild swelling to the lateral malleolar area. FROM with some discomfort. Pulses DP and PT 2+  Neurological:  Mental status and motor strength appears baseline for patient and situation.  Skin: No rash noted.  Psychiatric: She has a normal mood and affect.    ED Course  Procedures (including critical care time)  Dg Ankle Complete Left  01/15/2013   *RADIOLOGY REPORT*  Clinical Data: Left ankle pain after fall.  LEFT ANKLE COMPLETE - 3+ VIEW  Comparison: None.  Findings: Mild lateral soft tissue swelling about the left  ankle. Tiny bone fragments adjacent to the medial malleolus suggest avulsion fractures.  No displaced fractures identified.  Ankle mortis and talar dome appear intact.  Small plantar and Achilles calcaneal spurs.  Accessory os trigonum.  No focal bone lesion or bone destruction.  Bone cortex and trabecular architecture appear intact.  No radiopaque soft tissue foreign bodies.  IMPRESSION: Soft tissue swelling.  Small avulsion fracture off of the medial malleolus.  No displaced fractures identified.   Original Report Authenticated By: Burman Nieves, M.D.   MDM  Patient with c/o left ankle pain. Xray shows small avulsion fracture off the medial malleolus. Pain is all lateral. Mild swelling to the lateral aspect of the ankle. Reviewed results with the patient. Pt stable in ED with no significant deterioration in condition.The patient appears reasonably screened and/or stabilized for discharge and I doubt any other medical condition or other [facility] requiring further screening, evaluation, or treatment in the ED at this time prior to discharge.  MDM Reviewed: nursing note and vitals Interpretation: x-ray     Nicoletta Dress. Colon Branch, MD 01/15/13 [phone]

## 2013-08-28 ENCOUNTER — Emergency Department (HOSPITAL_COMMUNITY)
Admission: EM | Admit: 2013-08-28 | Discharge: 2013-08-28 | Disposition: A | Discharge status: Home / Self Care | Payer: Self-pay | Attending: Emergency Medicine | Admitting: Emergency Medicine

## 2013-08-28 ENCOUNTER — Encounter (HOSPITAL_COMMUNITY): Payer: Self-pay | Admitting: Emergency Medicine

## 2013-08-28 DIAGNOSIS — A5903 Trichomonal cystitis and urethritis: Secondary | ICD-10-CM

## 2013-08-28 DIAGNOSIS — R300 Dysuria: Secondary | ICD-10-CM

## 2013-08-28 DIAGNOSIS — F17200 Nicotine dependence, unspecified, uncomplicated: Secondary | ICD-10-CM | POA: Insufficient documentation

## 2013-08-28 DIAGNOSIS — R350 Frequency of micturition: Secondary | ICD-10-CM | POA: Insufficient documentation

## 2013-08-28 DIAGNOSIS — Z3202 Encounter for pregnancy test, result negative: Secondary | ICD-10-CM | POA: Insufficient documentation

## 2013-08-28 DIAGNOSIS — R3915 Urgency of urination: Secondary | ICD-10-CM | POA: Insufficient documentation

## 2013-08-28 DIAGNOSIS — M545 Low back pain, unspecified: Secondary | ICD-10-CM | POA: Insufficient documentation

## 2013-08-28 DIAGNOSIS — N898 Other specified noninflammatory disorders of vagina: Secondary | ICD-10-CM | POA: Insufficient documentation

## 2013-08-28 DIAGNOSIS — L2089 Other atopic dermatitis: Secondary | ICD-10-CM | POA: Insufficient documentation

## 2013-08-28 LAB — URINALYSIS, ROUTINE W REFLEX MICROSCOPIC
Bilirubin Urine: NEGATIVE
GLUCOSE, UA: NEGATIVE mg/dL
Ketones, ur: NEGATIVE mg/dL
Nitrite: NEGATIVE
UROBILINOGEN UA: 0.2 mg/dL (ref 0.0–1.0)
pH: 6 (ref 5.0–8.0)

## 2013-08-28 LAB — URINE MICROSCOPIC-ADD ON

## 2013-08-28 LAB — WET PREP, GENITAL: Yeast Wet Prep HPF POC: NONE SEEN

## 2013-08-28 LAB — PREGNANCY, URINE: Preg Test, Ur: NEGATIVE

## 2013-08-28 MED ORDER — CEPHALEXIN 500 MG PO CAPS: 500.0000 mg | ORAL_CAPSULE | Freq: Four times a day (QID) | ORAL | Status: DC

## 2013-08-28 MED ORDER — METRONIDAZOLE 500 MG PO TABS
2000.0000 mg | ORAL_TABLET | Freq: Once | ORAL | Status: AC
  Administered 2013-08-28: 2000 mg via ORAL
  Filled 2013-08-28: qty 4

## 2013-08-28 NOTE — ED Notes (Signed)
Burning/itching with urination and lower back pain. nad

## 2013-08-28 NOTE — Discharge Instructions (Signed)
Trichomoniasis °Trichomoniasis is an infection, caused by the Trichomonas organism, that affects both women and men. In women, the outer female genitalia and the vagina are affected. In men, the penis is mainly affected, but the prostate and other reproductive organs can also be involved. Trichomoniasis is a sexually transmitted disease (STD) and is most often passed to another person through sexual contact. The majority of people who get trichomoniasis do so from a sexual encounter and are also at risk for other STDs. °CAUSES  °· Sexual intercourse with an infected partner. °· It can be present in swimming pools or hot tubs. °SYMPTOMS  °· Abnormal gray-green frothy vaginal discharge in women. °· Vaginal itching and irritation in women. °· Itching and irritation of the area outside the vagina in women. °· Penile discharge with or without pain in males. °· Inflammation of the urethra (urethritis), causing painful urination. °· Bleeding after sexual intercourse. °RELATED COMPLICATIONS °· Pelvic inflammatory disease. °· Infection of the uterus (endometritis). °· Infertility. °· Tubal (ectopic) pregnancy. °· It can be associated with other STDs, including gonorrhea and chlamydia, hepatitis B, and HIV. °COMPLICATIONS DURING PREGNANCY °· Early (premature) delivery. °· Premature rupture of the membranes (PROM). °· Low birth weight. °DIAGNOSIS  °· Visualization of Trichomonas under the microscope from the vagina discharge. °· Ph of the vagina greater than 4.5, tested with a test tape. °· Trich Rapid Test. °· Culture of the organism, but this is not usually needed. °· It may be found on a Pap test. °· Having a "strawberry cervix,"which means the cervix looks very red like a strawberry. °TREATMENT  °· You may be given medication to fight the infection. Inform your caregiver if you could be or are pregnant. Some medications used to treat the infection should not be taken during pregnancy. °· Over-the-counter medications or  creams to decrease itching or irritation may be recommended. °· Your sexual partner will need to be treated if infected. °HOME CARE INSTRUCTIONS  °· Take all medication prescribed by your caregiver. °· Take over-the-counter medication for itching or irritation as directed by your caregiver. °· Do not have sexual intercourse while you have the infection. °· Do not douche or wear tampons. °· Discuss your infection with your partner, as your partner may have acquired the infection from you. Or, your partner may have been the person who transmitted the infection to you. °· Have your sex partner examined and treated if necessary. °· Practice safe, informed, and protected sex. °· See your caregiver for other STD testing. °SEEK MEDICAL CARE IF:  °· You still have symptoms after you finish the medication. °· You have an oral temperature above 102° F (38.9° C). °· You develop belly (abdominal) pain. °· You have pain when you urinate. °· You have bleeding after sexual intercourse. °· You develop a rash. °· The medication makes you sick or makes you throw up (vomit). °Document Released: 12/24/2000 Document Revised: 09/22/2011 Document Reviewed: 01/19/2009 °[employer]® Patient Information ©2014 [employer]. ° °Urinary Tract Infection °A urinary tract infection (UTI) can occur any place along the urinary tract. The tract includes the kidneys, ureters, bladder, and urethra. A type of germ called bacteria often causes a UTI. UTIs are often helped with antibiotic medicine.  °HOME CARE  °· If given, take antibiotics as told by your doctor. Finish them even if you start to feel better. °· Drink enough fluids to keep your pee (urine) clear or pale yellow. °· Avoid tea, drinks with caffeine, and bubbly (carbonated) drinks. °· Pee   often. Avoid holding your pee in for a long time.  Pee before and after having sex (intercourse).  Wipe from front to back after you poop (bowel movement) if you are a woman. Use each tissue only  once. GET HELP RIGHT AWAY IF:   You have back pain.  You have lower belly (abdominal) pain.  You have chills.  You feel sick to your stomach (nauseous).  You throw up (vomit).  Your burning or discomfort with peeing does not go away.  You have a fever.  Your symptoms are not better in 3 days. MAKE SURE YOU:   Understand these instructions.  Will watch your condition.  Will get help right away if you are not doing well or get worse. Document Released: 12/17/2007 Document Revised: 03/24/2012 Document Reviewed: 01/29/2012 [facility] Patient Information 2014 [place], [place].

## 2013-08-28 NOTE — ED Provider Notes (Signed)
CSN: [id number]     Arrival date & time 08/28/13  1524 History   First MD Initiated Contact with Patient 08/28/13 1604     Chief Complaint  Patient presents with  . Dysuria     (Consider location/radiation/quality/duration/timing/severity/associated sxs/prior Treatment) HPI Comments: [name] is a 38 y.o. female who presents to the Emergency Department complaining of burning with urination and vaginal  Itching. She states the pain began one day prior to ED arrival.  She reports having lower back pain and increased urinary frequency.  Pain is worse with urination.  Patient states that she has hx of frequent UTI's and pain feels similar.  She also reports having vaginal itching for several days and a slightly increased vaginal discharge.  She denies hematuria, fever, vomiting , abdominal pain or hx of previous kidney stones.  She states that her husband works out of town and she is unsure if he remains monogamous.    The history is provided by the patient.    History reviewed. No pertinent past medical history. Past Surgical History  Procedure Laterality Date  . Cholecystectomy    . Tubal ligation     History reviewed. No pertinent family history. History  Substance Use Topics  . Smoking status: Current Every Day Smoker  . Smokeless tobacco: Not on file  . Alcohol Use: Yes     Comment: twice a month   OB History   Grav Para Term Preterm Abortions TAB SAB Ect Mult Living                 Review of Systems  Constitutional: Negative for fever, chills and appetite change.  Respiratory: Negative for shortness of breath.   Cardiovascular: Negative for chest pain.  Gastrointestinal: Negative for nausea, vomiting, abdominal pain and blood in stool.  Genitourinary: Positive for dysuria, urgency, frequency and vaginal discharge. Negative for hematuria, flank pain, decreased urine volume, vaginal bleeding, difficulty urinating, genital sores, vaginal pain, menstrual problem,  pelvic pain and dyspareunia.  Musculoskeletal: Negative for back pain.  Skin: Negative for color change and rash.  Neurological: Negative for dizziness, weakness and numbness.  Hematological: Negative for adenopathy.  All other systems reviewed and are negative.      Allergies  Other  Home Medications   Current Outpatient Rx  Name  Route  Sig  Dispense  Refill  . acetaminophen (TYLENOL) 500 MG tablet   Oral   Take 1,000 mg by mouth every 6 (six) hours as needed for pain.         . naproxen sodium (ALEVE) 220 MG tablet   Oral   Take 440 mg by mouth daily as needed (pain).          BP 152/94  Pulse 100  Temp(Src) 98.1 F (36.7 C) (Oral)  Resp 20  Ht 5\' 4"  (1.626 m)  Wt 152 lb (68.947 kg)  BMI 26.08 kg/m2  SpO2 100%  LMP 08/14/2013 Physical Exam  Nursing note and vitals reviewed. Constitutional: She is oriented to person, place, and time. She appears well-developed and well-nourished. No distress.  HENT:  Head: Normocephalic and atraumatic.  Cardiovascular: Normal rate, regular rhythm, normal heart sounds and intact distal pulses.   No murmur heard. Pulmonary/Chest: Effort normal and breath sounds normal. No respiratory distress. She has no wheezes. She has no rales.  Abdominal: Soft. Normal appearance. She exhibits no distension and no mass. There is no hepatosplenomegaly. There is no tenderness. There is no rigidity, no rebound, no guarding, no  CVA tenderness and no tenderness at McBurney's point.  Mild ttp of the suprapubic region.  Remaining abdomen is soft, non-tender without guarding or rebound tenderness. No CVA tenderness  Genitourinary: Pelvic exam was performed with patient supine. Uterus is not enlarged and not tender. Cervix exhibits no motion tenderness and no discharge. Right adnexum displays no mass and no tenderness. Left adnexum displays no mass and no tenderness. No erythema, tenderness or bleeding around the vagina. No foreign body around the vagina.  No signs of injury around the vagina. Vaginal discharge found.  Thin, milky white discharge present in the vaginal vault.  No CMT, no adnexal masses or tenderness  Musculoskeletal: Normal range of motion. She exhibits no edema.  Neurological: She is alert and oriented to person, place, and time. Coordination normal.  Skin: Skin is warm and dry. No rash noted.    ED Course  Procedures (including critical care time) Labs Review Labs Reviewed  WET PREP, GENITAL - Abnormal; Notable for the following:    Trich, Wet Prep TOO NUMEROUS TO COUNT (*)    Clue Cells Wet Prep HPF POC MODERATE (*)    WBC, Wet Prep HPF POC TOO NUMEROUS TO COUNT (*)    All other components within normal limits  URINALYSIS, ROUTINE W REFLEX MICROSCOPIC - Abnormal; Notable for the following:    Specific Gravity, Urine >1.030 (*)    Hgb urine dipstick SMALL (*)    Protein, ur TRACE (*)    Leukocytes, UA SMALL (*)    All other components within normal limits  URINE MICROSCOPIC-ADD ON - Abnormal; Notable for the following:    Squamous Epithelial / LPF FEW (*)    Bacteria, UA FEW (*)    All other components within normal limits  URINE CULTURE  GC/CHLAMYDIA PROBE AMP  PREGNANCY, URINE   Imaging Review No results found.  EKG Interpretation   None       MDM   Final diagnoses:  Trichomonal urethritis  Dysuria    Urine culture pending, GC and chlamydia culture also pending.   Pt is well appearing, no concerning sx's for PID.   Vitals reviewed.   Patient given 2 g dose of flagyl.  Prescription for flagyl also written for husband.  Also given rx for keflex for UTI since vaginal itching and discharge was present prior to onset of dysuria.  Pt agrees to care plan and verbalized understanding.     Chelcey Caputo L. Vanessa [place], PA-C 08/30/13 [id number]

## 2013-08-29 LAB — GC/CHLAMYDIA PROBE AMP
CT PROBE, AMP APTIMA: NEGATIVE
GC PROBE AMP APTIMA: NEGATIVE

## 2013-08-31 LAB — URINE CULTURE

## 2013-08-31 NOTE — ED Provider Notes (Signed)
Medical screening examination/treatment/procedure(s) were performed by non-physician practitioner and as supervising physician I was immediately available for consultation/collaboration.  Jaidy Cottam, MD 08/31/13 0710 

## 2013-12-25 ENCOUNTER — Encounter (HOSPITAL_COMMUNITY): Payer: Self-pay | Admitting: Emergency Medicine

## 2013-12-25 ENCOUNTER — Emergency Department (HOSPITAL_COMMUNITY)
Admission: EM | Admit: 2013-12-25 | Discharge: 2013-12-25 | Disposition: A | Discharge status: Home / Self Care | Payer: Self-pay | Attending: Emergency Medicine | Admitting: Emergency Medicine

## 2013-12-25 DIAGNOSIS — H109 Unspecified conjunctivitis: Secondary | ICD-10-CM | POA: Insufficient documentation

## 2013-12-25 DIAGNOSIS — F17200 Nicotine dependence, unspecified, uncomplicated: Secondary | ICD-10-CM | POA: Insufficient documentation

## 2013-12-25 DIAGNOSIS — F3289 Other specified depressive episodes: Secondary | ICD-10-CM | POA: Insufficient documentation

## 2013-12-25 DIAGNOSIS — F329 Major depressive disorder, single episode, unspecified: Secondary | ICD-10-CM | POA: Insufficient documentation

## 2013-12-25 HISTORY — DX: Depression, unspecified: F32.A

## 2013-12-25 HISTORY — DX: Major depressive disorder, single episode, unspecified: F32.9

## 2013-12-25 MED ORDER — TOBRAMYCIN 0.3 % OP SOLN
1.0000 [drp] | Freq: Once | OPHTHALMIC | Status: AC
  Administered 2013-12-25: 1 [drp] via OPHTHALMIC
  Filled 2013-12-25: qty 5

## 2013-12-25 MED ORDER — KETOROLAC TROMETHAMINE 0.5 % OP SOLN
1.0000 [drp] | Freq: Once | OPHTHALMIC | Status: AC
  Administered 2013-12-25: 1 [drp] via OPHTHALMIC
  Filled 2013-12-25: qty 5

## 2013-12-25 MED ORDER — TETRACAINE HCL 0.5 % OP SOLN
OPHTHALMIC | Status: AC
  Administered 2013-12-25: 16:00:00 via OPHTHALMIC
  Filled 2013-12-25: qty 2

## 2013-12-25 MED ORDER — TRAMADOL HCL 50 MG PO TABS
50.0000 mg | ORAL_TABLET | Freq: Four times a day (QID) | ORAL | Status: DC | PRN
Start: 2013-12-25 — End: 2016-01-12

## 2013-12-25 NOTE — Discharge Instructions (Signed)

## 2013-12-25 NOTE — ED Notes (Signed)
C/o swelling and pain  to left eye.  Had swelling and pain to right about 2 weeks ago but did not seek treatment.  Swelling and pain increased during the night.

## 2013-12-25 NOTE — ED Notes (Signed)
Patient c/o left orbital swelling with left side facial pain x2 days. Per patient "eyes crusted together in mornings." Patient reports similar occurrence on right eye 2 weeks ago.

## 2013-12-28 NOTE — ED Provider Notes (Signed)
CSN: [id number]     Arrival date & time 12/25/13  1551 History   First MD Initiated Contact with Patient 12/25/13 1559     Chief Complaint  Patient presents with  . Facial Swelling  . Facial Pain     (Consider location/radiation/quality/duration/timing/severity/associated sxs/prior Treatment) Patient is a 38 y.o. female presenting with eye pain. The history is provided by the patient.  Eye Pain This is a new problem. Episode onset: 2 days. The problem occurs constantly. The problem has been gradually worsening. Pertinent negatives include no arthralgias, chills, congestion, coughing, fever, headaches, myalgias, neck pain, numbness, rash, sore throat, swollen glands, urinary symptoms, vertigo, visual change, vomiting or weakness. Exacerbated by: blinking, bright lights. She has tried nothing for the symptoms. The treatment provided no relief.    patient c/o pain, redness and swelling of the left upper eyelid x 2 days.  She also c/o pain to her eye and left side of her head that she describes as a Pounding sensation that radiates to her left ear and temple.  She states that her left eye has been "crusted together" in the mornings since onset and resolves after applying warm wet compresses.  She also has some blurred vision to the left eye with mild tearing.  She states she also had similar symptoms to the right eyelid approximately 2 weeks ago that spontaneously resolved after applying compresses and using visine.  She denies fever, facial swelling or redness around the eye, vomiting, headache, neck pain or stiffness, dental pain, or contact use.    Past Medical History  Diagnosis Date  . Depression    Past Surgical History  Procedure Laterality Date  . Cholecystectomy    . Tubal ligation     History reviewed. No pertinent family history. History  Substance Use Topics  . Smoking status: Current Every Day Smoker -- 2.00 packs/day for 22 years    Types: Cigarettes  . Smokeless tobacco:  Never Used  . Alcohol Use: Yes     Comment: twice a month   OB History   Grav Para Term Preterm Abortions TAB SAB Ect Mult Living   3 3 3       3      Review of Systems  Constitutional: Negative for fever, chills, activity change and appetite change.  HENT: Positive for ear pain. Negative for congestion, facial swelling, sore throat and trouble swallowing.   Eyes: Positive for pain, discharge, itching and visual disturbance. Negative for redness.       Redness and swelling of the left upper eyelid  Respiratory: Negative for cough.   Gastrointestinal: Negative for vomiting.  Musculoskeletal: Negative for arthralgias, myalgias and neck pain.  Skin: Negative for rash and wound.  Neurological: Negative for dizziness, vertigo, syncope, facial asymmetry, speech difficulty, weakness, numbness and headaches.  All other systems reviewed and are negative.     Allergies  Other  Home Medications   Prior to Admission medications   Medication Sig Start Date End Date Taking? Authorizing Soham Hollett  acetaminophen (TYLENOL) 500 MG tablet Take 1,000 mg by mouth every 6 (six) hours as needed for pain.    Historical Erving Sassano, MD  naproxen sodium (ALEVE) 220 MG tablet Take 440 mg by mouth daily as needed (pain).    Historical Jemal Miskell, MD  traMADol (ULTRAM) 50 MG tablet Take 1 tablet (50 mg total) by mouth every 6 (six) hours as needed. 12/25/13   Tammy L. Triplett, PA-C   BP 155/90  Pulse 86  Temp(Src) 98 F (  36.7 C) (Oral)  Resp 18  Ht 5\' 2"  (1.575 m)  Wt 160 lb (72.576 kg)  BMI 29.26 kg/m2  SpO2 98%  LMP 11/11/2013 Physical Exam  Nursing note and vitals reviewed. Constitutional: She is oriented to person, place, and time. She appears well-developed and well-nourished. No distress.  HENT:  Head: Normocephalic and atraumatic.  Right Ear: Tympanic membrane and ear canal normal. No mastoid tenderness. No hemotympanum.  Left Ear: Tympanic membrane, external ear and ear canal normal. No  mastoid tenderness. No hemotympanum.  Mouth/Throat: Oropharynx is clear and moist.  Eyes: Conjunctivae and EOM are normal. Pupils are equal, round, and reactive to light. Lids are everted and swept, no foreign bodies found. Left eye exhibits discharge. Left eye exhibits no chemosis. No foreign body present in the left eye. Right conjunctiva is not injected. Left conjunctiva is not injected. Left conjunctiva has no hemorrhage. Left eye exhibits normal extraocular motion and no nystagmus. Left pupil is round and reactive. Pupils are equal.  Edema and mild erythema localized to the left upper eyelid only.  Tender to palp.  Exudate present to medial canthus of the eye.  No periorbital tenderness or erythema.  Conjunctiva clear.  No hordelum  Neck: Normal range of motion. Neck supple. No thyromegaly present.  Cardiovascular: Normal rate, regular rhythm, normal heart sounds and intact distal pulses.   No murmur heard. Pulmonary/Chest: Effort normal and breath sounds normal. No respiratory distress.  Musculoskeletal: Normal range of motion.  Lymphadenopathy:    She has no cervical adenopathy.  Neurological: She is alert and oriented to person, place, and time. She exhibits normal muscle tone. Coordination normal.  Skin: Skin is warm and dry. No rash noted.    ED Course  Procedures (including critical care time) Labs Review Labs Reviewed - No data to display  Imaging Review No results found.   EKG Interpretation None      MDM   Final diagnoses:  Conjunctivitis   Visual Acuity - Bilateral Distance: 20/25 ; R Distance: 20/25 ; L Distance: 20/25   Pt is well appearing, non-toxic.  No focal neuro deficits, no meningeal signs.  Pt reports similar sx's to other eyelid 2 weeks ago with spontaneous resolution.  Sx's are localized to the upper eyelid only with exudate present.  Advised pt of probable conjunctivitis vs lacrimal duct infection/obstruction. On further questioning, pt does admit to  frequent dry eyes and previously had to use rx lubrication drops to her eyes.    Pt agrees to sx treatment with acular and tobramycin drops, warm compresses and OTC lubricating drops.  Also advised pt to close f/u with ophthalmologist, Dr. Iona Hansen or to return here if sx's not improving.  Referral info given.  Pt agrees to plan and verbalized understanding      Tammy L. Vanessa [place], PA-C 12/28/13 1810

## 2013-12-29 NOTE — ED Provider Notes (Signed)
Medical screening examination/treatment/procedure(s) were performed by non-physician practitioner and as supervising physician I was immediately available for consultation/collaboration.   EKG Interpretation None       Virgel Manifold, MD 12/29/13 [phone]

## 2014-05-15 ENCOUNTER — Encounter (HOSPITAL_COMMUNITY): Payer: Self-pay | Admitting: Emergency Medicine

## 2014-05-22 ENCOUNTER — Encounter (HOSPITAL_COMMUNITY): Payer: Self-pay | Admitting: *Deleted

## 2014-05-22 ENCOUNTER — Emergency Department (HOSPITAL_COMMUNITY)
Admission: EM | Admit: 2014-05-22 | Discharge: 2014-05-22 | Disposition: A | Discharge status: Home / Self Care | Payer: Self-pay | Attending: Emergency Medicine | Admitting: Emergency Medicine

## 2014-05-22 DIAGNOSIS — Z8659 Personal history of other mental and behavioral disorders: Secondary | ICD-10-CM | POA: Insufficient documentation

## 2014-05-22 DIAGNOSIS — Z720 Tobacco use: Secondary | ICD-10-CM | POA: Insufficient documentation

## 2014-05-22 DIAGNOSIS — Z79899 Other long term (current) drug therapy: Secondary | ICD-10-CM | POA: Insufficient documentation

## 2014-05-22 DIAGNOSIS — H01003 Unspecified blepharitis right eye, unspecified eyelid: Secondary | ICD-10-CM | POA: Insufficient documentation

## 2014-05-22 MED ORDER — KETOROLAC TROMETHAMINE 0.5 % OP SOLN
1.0000 [drp] | Freq: Once | OPHTHALMIC | Status: AC
  Administered 2014-05-22: 1 [drp] via OPHTHALMIC
  Filled 2014-05-22: qty 5

## 2014-05-22 MED ORDER — TETRACAINE HCL 0.5 % OP SOLN
OPHTHALMIC | Status: AC
  Filled 2014-05-22: qty 2

## 2014-05-22 MED ORDER — FLUORESCEIN SODIUM 1 MG OP STRP
1.0000 | ORAL_STRIP | Freq: Once | OPHTHALMIC | Status: AC
  Administered 2014-05-22: 1 via OPHTHALMIC

## 2014-05-22 MED ORDER — FLUORESCEIN SODIUM 1 MG OP STRP
ORAL_STRIP | OPHTHALMIC | Status: AC
  Filled 2014-05-22: qty 1

## 2014-05-22 MED ORDER — TOBRAMYCIN 0.3 % OP SOLN
1.0000 [drp] | Freq: Once | OPHTHALMIC | Status: AC
  Administered 2014-05-22: 1 [drp] via OPHTHALMIC
  Filled 2014-05-22: qty 5

## 2014-05-22 MED ORDER — ERYTHROMYCIN 5 MG/GM OP OINT
TOPICAL_OINTMENT | Freq: Once | OPHTHALMIC | Status: AC
  Administered 2014-05-22: 1 via OPHTHALMIC
  Filled 2014-05-22: qty 3.5

## 2014-05-22 MED ORDER — TETRACAINE HCL 0.5 % OP SOLN
1.0000 [drp] | Freq: Once | OPHTHALMIC | Status: AC
  Administered 2014-05-22: 1 [drp] via OPHTHALMIC

## 2014-05-22 NOTE — ED Provider Notes (Signed)
CSN: [id number]     Arrival date & time 05/22/14  1308 History  This chart was scribed for non-physician practitioner [name], PA-C working with Babette Relic, MD by Zola Button, ED Scribe. This patient was seen in room APFT20/[facility] and the patient's care was started at 3:36 PM.     Chief Complaint  Patient presents with  . Eye Pain     The history is provided by the patient. No language interpreter was used.   HPI Comments: [name] is a 38 y.o. female who presents to the Emergency Department complaining of gradual onset, constant, itching right eye pain that began yesterday. Patient also reports right eye swelling that began this morning. She has not tried any medications for her symptoms, but she has been using cold compresses. Patient denies rubbing her eyes, use of contacts, and use of eye makeup. She does not feel like there are any foreign bodies in her eyes. Patient has not been working and has been staying at home with her sick mother; she has not been around many people recently. Patient has had similar symptoms in her left eye about 2 months ago, except there was also eye crusting in her left eye. She was diagnosed with conjunctivitis at the time, and it took her about 2 weeks to fully recover.   No PCP  Past Medical History  Diagnosis Date  . Depression    Past Surgical History  Procedure Laterality Date  . Cholecystectomy    . Tubal ligation     History reviewed. No pertinent family history. History  Substance Use Topics  . Smoking status: Current Every Day Smoker -- 2.00 packs/day for 22 years    Types: Cigarettes  . Smokeless tobacco: Never Used  . Alcohol Use: Yes     Comment: twice a month   OB History    Gravida Para Term Preterm AB TAB SAB Ectopic Multiple Living   3 3 3       3      Review of Systems  Constitutional: Negative for fever.  HENT: Negative for congestion and sore throat.   Eyes: Positive for pain, redness and itching. Negative for  discharge.  Respiratory: Negative for chest tightness and shortness of breath.   Cardiovascular: Negative for chest pain.  Gastrointestinal: Negative for nausea and abdominal pain.  Genitourinary: Negative.   Musculoskeletal: Negative for joint swelling, arthralgias and neck pain.  Skin: Negative.  Negative for rash and wound.  Neurological: Negative for dizziness, weakness, light-headedness, numbness and headaches.  Psychiatric/Behavioral: Negative.       Allergies  Other  Home Medications   Prior to Admission medications   Medication Sig Start Date End Date Taking? Authorizing Provider  ibuprofen (ADVIL,MOTRIN) 200 MG tablet Take 600 mg by mouth daily as needed for moderate pain.   Yes Historical Provider, MD  lansoprazole (PREVACID) 30 MG capsule Take 30 mg by mouth daily at 12 noon.   Yes Historical Provider, MD  acetaminophen (TYLENOL) 500 MG tablet Take 1,000 mg by mouth every 6 (six) hours as needed for pain.    Historical Provider, MD  naproxen sodium (ALEVE) 220 MG tablet Take 440 mg by mouth daily as needed (pain).    Historical Provider, MD  traMADol (ULTRAM) 50 MG tablet Take 1 tablet (50 mg total) by mouth every 6 (six) hours as needed. Patient not taking: Reported on 05/22/2014 12/25/13   Tammy L. Triplett, PA-C   BP 157/97 mmHg  Pulse 94  Temp(Src)  98.3 F (36.8 C) (Oral)  Resp 18  Ht 5\' 4"  (1.626 m)  Wt 170 lb (77.111 kg)  BMI 29.17 kg/m2  SpO2 100%  LMP 05/19/2014 (Exact Date) Physical Exam  Constitutional: She is oriented to person, place, and time. She appears well-developed and well-nourished.  HENT:  Head: Normocephalic and atraumatic.  No head or neck lymphadenopathy.  Eyes: Conjunctivae and EOM are normal. Pupils are equal, round, and reactive to light. Right eye exhibits no discharge. Left eye exhibits no discharge. Right conjunctiva is not injected. Left conjunctiva is not injected.  Edema and erythema of right upper eyelid only. There is no drainage  and no conjunctival injection. No stye. Pupils are equally reactive. EOMs are intact.Visual Acuity - R Distance: 20/70 ; L Distance: 20/13    Neck: Normal range of motion.  Cardiovascular: Normal rate.   Pulmonary/Chest: Effort normal.  Musculoskeletal: Normal range of motion.  Lymphadenopathy:    She has no cervical adenopathy.  Neurological: She is alert and oriented to person, place, and time.  Skin: Skin is warm and dry.  Psychiatric: She has a normal mood and affect.  Nursing note and vitals reviewed.   ED Course  Procedures  DIAGNOSTIC STUDIES: Oxygen Saturation is 100% on room air, normal by my interpretation.    COORDINATION OF CARE: 3:43 PM-Discussed treatment plan which includes abx ointment and ketorolac gtts with pt at bedside and pt agreed to plan.  Cool compresses, avoid rubbing eyes.  Baby shampoo wash bid.   Labs Review Labs Reviewed - No data to display  Imaging Review No results found.   EKG Interpretation None      MDM   Final diagnoses:  Blepharitis of eyelid of right eye    Referral to Dr. Iona Hansen for recheck/further evaluation of sx, esp given pt has had prior episode of similar sx.  I personally performed the services described in this documentation, which was scribed in my presence. The recorded information has been reviewed and is accurate.   [name], PA-C 05/23/14 2019  Babette Relic, MD 06/08/14 [phone]

## 2014-05-22 NOTE — ED Notes (Signed)
Rt eye swollen and red, No injury,  No  drainage

## 2014-05-22 NOTE — ED Notes (Signed)
PA at bedside.

## 2014-05-22 NOTE — Discharge Instructions (Signed)
Blepharitis Blepharitis is redness, soreness, and swelling (inflammation) of one or both eyelids. It may be caused by an allergic reaction or a bacterial infection. Blepharitis may also be associated with reddened, scaly skin (seborrhea) of the scalp and eyebrows. While you sleep, eye discharge may cause your eyelashes to stick together. Your eyelids may itch, burn, swell, and may lose their lashes. These will grow back. Your eyes may become sensitive. Blepharitis may recur and need repeated treatment. If this is the case, you may require further evaluation by an eye specialist (ophthalmologist). HOME CARE INSTRUCTIONS   Keep your hands clean.  Use a clean towel each time you dry your eyelids. Do not use this towel to clean other areas. Do not share a towel or makeup with anyone.  Wash your eyelids with warm water or warm water mixed with a small amount of baby shampoo. Do this twice a day or as often as needed.  Wash your face and eyebrows at least once a day.  Use warm compresses 2 times a day for 10 minutes at a time, or as directed by your caregiver.  Apply antibiotic ointment as directed by your caregiver.  Avoid rubbing your eyes.  Avoid wearing makeup until you get better.  Follow up with your caregiver as directed. SEEK IMMEDIATE MEDICAL CARE IF:   You have pain, redness, or swelling that gets worse or spreads to other parts of your face.  Your vision changes, or you have pain when looking at lights or moving objects.  You have a fever.  Your symptoms continue for longer than 2 to 4 days or become worse. MAKE SURE YOU:   Understand these instructions.  Will watch your condition.  Will get help right away if you are not doing well or get worse. Document Released: 06/27/2000 Document Revised: 09/22/2011 Document Reviewed: 08/07/2010 [facility] Patient Information 2015 [place], [place]. This information is not intended to replace advice given to you by your health care  provider. Make sure you discuss any questions you have with your health care provider.   Apply the erythromycin ointment to your right eye and eyelid every 4 hours while awake.  Apply the ketorolac drop to this eye every 8 hours to help with inflammation.

## 2016-01-12 ENCOUNTER — Emergency Department (HOSPITAL_COMMUNITY): Payer: Self-pay

## 2016-01-12 ENCOUNTER — Emergency Department (HOSPITAL_COMMUNITY)
Admission: EM | Admit: 2016-01-12 | Discharge: 2016-01-12 | Disposition: A | Discharge status: Home / Self Care | Payer: Self-pay | Attending: Emergency Medicine | Admitting: Emergency Medicine

## 2016-01-12 ENCOUNTER — Encounter (HOSPITAL_COMMUNITY): Payer: Self-pay | Admitting: Emergency Medicine

## 2016-01-12 DIAGNOSIS — I10 Essential (primary) hypertension: Secondary | ICD-10-CM | POA: Insufficient documentation

## 2016-01-12 DIAGNOSIS — Z79899 Other long term (current) drug therapy: Secondary | ICD-10-CM | POA: Insufficient documentation

## 2016-01-12 DIAGNOSIS — Z791 Long term (current) use of non-steroidal anti-inflammatories (NSAID): Secondary | ICD-10-CM | POA: Insufficient documentation

## 2016-01-12 DIAGNOSIS — F329 Major depressive disorder, single episode, unspecified: Secondary | ICD-10-CM | POA: Insufficient documentation

## 2016-01-12 DIAGNOSIS — Z7982 Long term (current) use of aspirin: Secondary | ICD-10-CM | POA: Insufficient documentation

## 2016-01-12 DIAGNOSIS — R51 Headache: Secondary | ICD-10-CM

## 2016-01-12 DIAGNOSIS — F17210 Nicotine dependence, cigarettes, uncomplicated: Secondary | ICD-10-CM | POA: Insufficient documentation

## 2016-01-12 DIAGNOSIS — R519 Headache, unspecified: Secondary | ICD-10-CM

## 2016-01-12 HISTORY — DX: Essential (primary) hypertension: I10

## 2016-01-12 LAB — URINALYSIS, ROUTINE W REFLEX MICROSCOPIC
Bilirubin Urine: NEGATIVE
GLUCOSE, UA: NEGATIVE mg/dL
KETONES UR: NEGATIVE mg/dL
LEUKOCYTES UA: NEGATIVE
Nitrite: NEGATIVE
PH: 6 (ref 5.0–8.0)
PROTEIN: NEGATIVE mg/dL
Specific Gravity, Urine: <1.005 — ABNORMAL LOW (ref 1.005–1.030)

## 2016-01-12 LAB — CBC WITH DIFFERENTIAL/PLATELET
BASOS ABS: 0 10*3/uL (ref 0.0–0.1)
BASOS PCT: 0 %
EOS ABS: 0.1 10*3/uL (ref 0.0–0.7)
Eosinophils Relative: 1 %
HCT: 41.1 % (ref 36.0–46.0)
HEMOGLOBIN: 14.6 g/dL (ref 12.0–15.0)
Lymphocytes Relative: 32 %
Lymphs Abs: 3.2 10*3/uL (ref 0.7–4.0)
MCH: 33.6 pg (ref 26.0–34.0)
MCHC: 35.5 g/dL (ref 30.0–36.0)
MCV: 94.7 fL (ref 78.0–100.0)
MONOS PCT: 6 %
Monocytes Absolute: 0.6 10*3/uL (ref 0.1–1.0)
NEUTROS PCT: 60 %
Neutro Abs: 5.9 10*3/uL (ref 1.7–7.7)
Platelets: 259 10*3/uL (ref 150–400)
RBC: 4.34 MIL/uL (ref 3.87–5.11)
RDW: 13 % (ref 11.5–15.5)
WBC: 9.9 10*3/uL (ref 4.0–10.5)

## 2016-01-12 LAB — COMPREHENSIVE METABOLIC PANEL
ALBUMIN: 4.2 g/dL (ref 3.5–5.0)
ALT: 15 U/L (ref 14–54)
AST: 18 U/L (ref 15–41)
Alkaline Phosphatase: 84 U/L (ref 38–126)
Anion gap: 6 (ref 5–15)
BUN: 6 mg/dL (ref 6–20)
CALCIUM: 8.6 mg/dL — AB (ref 8.9–10.3)
CO2: 25 mmol/L (ref 22–32)
Chloride: 105 mmol/L (ref 101–111)
Creatinine, Ser: 0.73 mg/dL (ref 0.44–1.00)
GFR calc Af Amer: >60 mL/min (ref >60)
GFR calc non Af Amer: >60 mL/min (ref >60)
GLUCOSE: 93 mg/dL (ref 65–99)
Potassium: 3.4 mmol/L — ABNORMAL LOW (ref 3.5–5.1)
SODIUM: 136 mmol/L (ref 135–145)
Total Bilirubin: 0.6 mg/dL (ref 0.3–1.2)
Total Protein: 7.4 g/dL (ref 6.5–8.1)

## 2016-01-12 LAB — URINE MICROSCOPIC-ADD ON

## 2016-01-12 LAB — TROPONIN I: Troponin I: <0.03 ng/mL (ref <0.03)

## 2016-01-12 LAB — PREGNANCY, URINE: Preg Test, Ur: NEGATIVE

## 2016-01-12 MED ORDER — DIPHENHYDRAMINE HCL 50 MG/ML IJ SOLN
25.0000 mg | Freq: Once | INTRAMUSCULAR | Status: AC
  Administered 2016-01-12: 25 mg via INTRAVENOUS
  Filled 2016-01-12: qty 1

## 2016-01-12 MED ORDER — KETOROLAC TROMETHAMINE 30 MG/ML IJ SOLN
30.0000 mg | Freq: Once | INTRAMUSCULAR | Status: AC
  Administered 2016-01-12: 30 mg via INTRAVENOUS
  Filled 2016-01-12: qty 1

## 2016-01-12 MED ORDER — METOCLOPRAMIDE HCL 5 MG/ML IJ SOLN
10.0000 mg | Freq: Once | INTRAMUSCULAR | Status: AC
  Administered 2016-01-12: 10 mg via INTRAVENOUS
  Filled 2016-01-12: qty 2

## 2016-01-12 MED ORDER — SODIUM CHLORIDE 0.9 % IV BOLUS (SEPSIS)
1000.0000 mL | Freq: Once | INTRAVENOUS | Status: AC
  Administered 2016-01-12: 1000 mL via INTRAVENOUS

## 2016-01-12 NOTE — ED Notes (Signed)
Pt states she has not been feeling well today.  Checked her bp today and it was elevated.  Has been dx with hypertension, but does not take meds due to no insurance.

## 2016-01-12 NOTE — ED Provider Notes (Signed)
CSN: [id number]     Arrival date & time 01/12/16  1141 History   By signing my name below, I, Dyke Brackett, attest that this documentation has been prepared under the direction and in the presence of Ezequiel Essex, MD . Electronically Signed: Dyke Brackett, Scribe. 01/12/2016. 1:25 PM.   Chief Complaint  Patient presents with  . Hypertension    The history is provided by the patient. No language interpreter was used.    HPI Comments:  ONALEE STEINMEYER is a 40 y.o. female with PMHx of HTN who presents to the Emergency Department complaining of gradually worsening, moderate headache onset last night. 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 Pt states she has never experienced a headache like this before. Pt has taken Advil with no relief. No alleviating factors noted. She also complains of associated nausea, vomiting 3x, light headed, dizziness, and high blood pressure. Per pt, she borrowed a machine from a neighbor and checked blood pressure yesterday and states it was 157/112. She has been dx with hypertension but does not take any BP medicine for it. Pt states she feels light headed when she stands up. She denies any fever, room spinning, blurred vision, double vision, or chest pain.  Past Medical History  Diagnosis Date  . Depression   . Hypertension    Past Surgical History  Procedure Laterality Date  . Cholecystectomy    . Tubal ligation     History reviewed. No pertinent family history. Social History  Substance Use Topics  . Smoking status: Current Every Day Smoker -- 2.00 packs/day for 22 years    Types: Cigarettes  . Smokeless tobacco: Never Used  . Alcohol Use: Yes     Comment: twice a month   OB History    Gravida Para Term Preterm AB TAB SAB Ectopic Multiple Living   3 3 3       3      Review of Systems  Constitutional: Negative for fever.  Eyes: Negative for visual disturbance.   Gastrointestinal: Positive for nausea and vomiting.  Neurological: Positive for dizziness and light-headedness.   10 Systems reviewed and are negative for acute change except as noted in the HPI.   Allergies  Other  Home Medications   Prior to Admission medications   Medication Sig Start Date End Date Taking? Authorizing Provider  acetaminophen (TYLENOL) 500 MG tablet Take 1,000 mg by mouth every 6 (six) hours as needed for pain.   Yes Historical Provider, MD  aspirin 325 MG tablet Take 325 mg by mouth every 6 (six) hours as needed for mild pain.   Yes Historical Provider, MD  ibuprofen (ADVIL,MOTRIN) 200 MG tablet Take 600 mg by mouth daily as needed for moderate pain.   Yes Historical Provider, MD  lansoprazole (PREVACID) 30 MG capsule Take 30 mg by mouth daily at 12 noon.   Yes Historical Provider, MD  naproxen sodium (ALEVE) 220 MG tablet Take 440 mg by mouth daily as needed (pain).   Yes Historical Provider, MD   BP 159/113 mmHg  Pulse 97  Temp(Src) 97.9 F (36.6 C) (Oral)  Resp 16  Ht 5\' 3"  (1.6 m)  Wt 180 lb (81.647 kg)  BMI 31.89 kg/m2  SpO2 99%  LMP 01/07/2016 Physical Exam  Constitutional: She is oriented to person, place, and time. She appears well-developed and well-nourished. No distress.  Obese   HENT:  Head: Normocephalic and atraumatic.  Mouth/Throat: Oropharynx is clear and moist. No oropharyngeal exudate.  Eyes: Conjunctivae and EOM are normal. Pupils are equal, round, and reactive to light.  Horizontal right sided nystagmus  Neck: Normal range of motion. Neck supple.  No meningismus.  Cardiovascular: Normal rate, regular rhythm, normal heart sounds and intact distal pulses.   No murmur heard. Pulmonary/Chest: Effort normal and breath sounds normal. No respiratory distress.  Abdominal: Soft. There is no tenderness. There is no rebound and no guarding.  Musculoskeletal: Normal range of motion. She exhibits no edema or tenderness.  Neurological: She is  alert and oriented to person, place, and time. No cranial nerve deficit. She exhibits normal muscle tone. Coordination normal.   5/5 strength throughout. CN 2-12 intact.Equal grip strength.   Skin: Skin is warm.  Psychiatric: She has a normal mood and affect. Her behavior is normal.  Nursing note and vitals reviewed.   ED Course  Procedures  DIAGNOSTIC STUDIES:  Oxygen Saturation is 94% on RA, low by my interpretation.    COORDINATION OF CARE:  12:35 Will order Head CT,  Urinalysis, CMP, CBC.Discussed treatment plan with pt at bedside and pt agreed to plan.  Labs Review Labs Reviewed  COMPREHENSIVE METABOLIC PANEL - Abnormal; Notable for the following:    Potassium 3.4 (*)    Calcium 8.6 (*)    All other components within normal limits  URINALYSIS, ROUTINE W REFLEX MICROSCOPIC (NOT AT [facility]) - Abnormal; Notable for the following:    Specific Gravity, Urine <1.005 (*)    Hgb urine dipstick TRACE (*)    All other components within normal limits  URINE MICROSCOPIC-ADD ON - Abnormal; Notable for the following:    Squamous Epithelial / LPF 0-5 (*)    Bacteria, UA RARE (*)    All other components within normal limits  CBC WITH DIFFERENTIAL/PLATELET  PREGNANCY, URINE  TROPONIN I    Imaging Review Ct Head Wo Contrast  01/12/2016  CLINICAL DATA:  Worsening headache for 2 days, initial encounter EXAM: CT HEAD WITHOUT CONTRAST TECHNIQUE: Contiguous axial images were obtained from the base of the skull through the vertex without intravenous contrast. COMPARISON:  None. FINDINGS: The bony calvarium is intact. The ventricles are of normal size and configuration. No findings to suggest acute hemorrhage, acute infarction or space-occupying mass lesion are noted. IMPRESSION: No acute intracranial abnormality noted. Electronically Signed   By: Inez Catalina M.D.   On: 01/12/2016 13:27   I have personally reviewed and evaluated these images and lab results as part of my medical decision-making.    EKG Interpretation   Date/Time:  Saturday January 12 2016 12:54:10 EDT Ventricular Rate:  82 PR Interval:    QRS Duration: 85 QT Interval:  360 QTC Calculation: 421 R Axis:   5 Text Interpretation:  Sinus rhythm Low voltage, extremity and precordial  leads No previous ECGs available Nonspecific T wave abnormality Confirmed  by [place] [phone]) on 01/12/2016 1:04:29 PM      MDM   Final diagnoses:  Headache, unspecified headache type  Essential hypertension  Gradual onset headache since yesterday with nausea and vomiting this morning.  Elevated BP today. No CP or SOB.  nonfocal neuro exam.  Work of reassuring. CT head negative. Blood pressure has improved to 138/87. Patient feels much improved after Reglan, Benadryl and Toradol. She is tolerating by mouth and ambulatory. Denies any dizziness or lightheadedness.  Advised to keep a record of her blood pressure and follow-up  with a primary care physician. Will not start blood pressure medication at this time. No evidence of endorgan damage.  I personally performed the services described in this documentation, which was scribed in my presence. The recorded information has been reviewed and is accurate.     Ezequiel Essex, MD 01/12/16 [phone]

## 2016-01-12 NOTE — ED Notes (Signed)
Pt ambulated with steady gait to bathroom. nad noted.

## 2016-01-12 NOTE — Discharge Instructions (Signed)
General Headache Without Cause Keep a record of your blood pressure as discussed. Follow up with a primary care physician. Return to the ED if you develop new or worsening symptoms. A headache is pain or discomfort felt around the head or neck area. The specific cause of a headache may not be found. There are many causes and types of headaches. A few common ones are:  Tension headaches.  Migraine headaches.  Cluster headaches.  Chronic daily headaches. HOME CARE INSTRUCTIONS  Watch your condition for any changes. Take these steps to help with your condition: Managing Pain  Take over-the-counter and prescription medicines only as told by your health care provider.  Lie down in a dark, quiet room when you have a headache.  If directed, apply ice to the head and neck area:  Put ice in a plastic bag.  Place a towel between your skin and the bag.  Leave the ice on for 20 minutes, 2-3 times per day.  Use a heating pad or hot shower to apply heat to the head and neck area as told by your health care provider.  Keep lights dim if bright lights bother you or make your headaches worse. Eating and Drinking  Eat meals on a regular schedule.  Limit alcohol use.  Decrease the amount of caffeine you drink, or stop drinking caffeine. General Instructions  Keep all follow-up visits as told by your health care provider. This is important.  Keep a headache journal to help find out what may trigger your headaches. For example, write down:  What you eat and drink.  How much sleep you get.  Any change to your diet or medicines.  Try massage or other relaxation techniques.  Limit stress.  Sit up straight, and do not tense your muscles.  Do not use tobacco products, including cigarettes, chewing tobacco, or e-cigarettes. If you need help quitting, ask your health care provider.  Exercise regularly as told by your health care provider.  Sleep on a regular schedule. Get 7-9 hours of  sleep, or the amount recommended by your health care provider. SEEK MEDICAL CARE IF:   Your symptoms are not helped by medicine.  You have a headache that is different from the usual headache.  You have nausea or you vomit.  You have a fever. SEEK IMMEDIATE MEDICAL CARE IF:   Your headache becomes severe.  You have repeated vomiting.  You have a stiff neck.  You have a loss of vision.  You have problems with speech.  You have pain in the eye or ear.  You have muscular weakness or loss of muscle control.  You lose your balance or have trouble walking.  You feel faint or pass out.  You have confusion.   This information is not intended to replace advice given to you by your health care provider. Make sure you discuss any questions you have with your health care provider.   Document Released: 06/30/2005 Document Revised: 03/21/2015 Document Reviewed: 10/23/2014 Elsevier Interactive Patient Education [employer].

## 2016-01-12 NOTE — ED Notes (Signed)
Pt given ice water. Pt tolerating well. EDP at bedside for re-eval.

## 2016-04-18 ENCOUNTER — Emergency Department (HOSPITAL_COMMUNITY)
Admission: EM | Admit: 2016-04-18 | Discharge: 2016-04-18 | Disposition: A | Discharge status: Home / Self Care | Payer: Self-pay | Attending: Emergency Medicine | Admitting: Emergency Medicine

## 2016-04-18 ENCOUNTER — Encounter (HOSPITAL_COMMUNITY): Payer: Self-pay | Admitting: Emergency Medicine

## 2016-04-18 DIAGNOSIS — Z79899 Other long term (current) drug therapy: Secondary | ICD-10-CM | POA: Insufficient documentation

## 2016-04-18 DIAGNOSIS — I10 Essential (primary) hypertension: Secondary | ICD-10-CM | POA: Insufficient documentation

## 2016-04-18 DIAGNOSIS — K5900 Constipation, unspecified: Secondary | ICD-10-CM | POA: Insufficient documentation

## 2016-04-18 DIAGNOSIS — F17210 Nicotine dependence, cigarettes, uncomplicated: Secondary | ICD-10-CM | POA: Insufficient documentation

## 2016-04-18 DIAGNOSIS — Z7982 Long term (current) use of aspirin: Secondary | ICD-10-CM | POA: Insufficient documentation

## 2016-04-18 DIAGNOSIS — M7989 Other specified soft tissue disorders: Secondary | ICD-10-CM | POA: Insufficient documentation

## 2016-04-18 MED ORDER — ENOXAPARIN SODIUM 100 MG/ML ~~LOC~~ SOLN
1.0000 mg/kg | Freq: Once | SUBCUTANEOUS | Status: AC
  Administered 2016-04-18: 95 mg via SUBCUTANEOUS
  Filled 2016-04-18: qty 1

## 2016-04-18 MED ORDER — POLYETHYLENE GLYCOL 3350 17 G PO PACK: 17.0000 g | PACK | Freq: Every day | ORAL | 0 refills | Status: DC | PRN

## 2016-04-18 NOTE — ED Triage Notes (Signed)
PT c/o left lower leg swelling, redness and pain with no injury that started last night.

## 2016-04-18 NOTE — ED Provider Notes (Signed)
[place] DEPT Provider Note   CSN: [id number] Arrival date & time: 04/18/16  1833     History   Chief Complaint Chief Complaint  Patient presents with  . Leg Swelling    HPI [name] is a 40 y.o. female.  HPI Patient presents with pain and swelling in her left lower leg. States it started last night. No injury. She works in Engineer, drilling. States there was no trauma. No chest pain or trouble breathing. She's not had swelling like this before. No fevers. Pain is worse with walking. Patient does smoke cigarettes.   Past Medical History:  Diagnosis Date  . Depression   . Hypertension     There are no active problems to display for this patient.   Past Surgical History:  Procedure Laterality Date  . CHOLECYSTECTOMY    . TUBAL LIGATION      OB History    Gravida Para Term Preterm AB Living   3 3 3     3    SAB TAB Ectopic Multiple Live Births                   Home Medications    Prior to Admission medications   Medication Sig Start Date End Date Taking? Authorizing Provider  acetaminophen (TYLENOL) 500 MG tablet Take 1,000 mg by mouth every 6 (six) hours as needed for pain.    Historical Provider, MD  aspirin 325 MG tablet Take 325 mg by mouth every 6 (six) hours as needed for mild pain.    Historical Provider, MD  ibuprofen (ADVIL,MOTRIN) 200 MG tablet Take 600 mg by mouth daily as needed for moderate pain.    Historical Provider, MD  lansoprazole (PREVACID) 30 MG capsule Take 30 mg by mouth daily at 12 noon.    Historical Provider, MD  naproxen sodium (ALEVE) 220 MG tablet Take 440 mg by mouth daily as needed (pain).    Historical Provider, MD  polyethylene glycol (MIRALAX) packet Take 17 g by mouth daily as needed for mild constipation. 04/18/16   Davonna Belling, MD    Family History History reviewed. No pertinent family history.  Social History Social History  Substance Use Topics  . Smoking status: Current Every Day Smoker   Packs/day: 1.50    Years: 22.00    Types: Cigarettes  . Smokeless tobacco: Never Used  . Alcohol use Yes     Comment: twice a month     Allergies   Other   Review of Systems Review of Systems  Constitutional: Negative for appetite change.  Respiratory: Negative for choking and shortness of breath.   Cardiovascular: Positive for leg swelling.  Gastrointestinal: Positive for constipation. Negative for abdominal pain.       Patient states she has not had a bowel movement in 2 days. States this is unusual for her. Mild abdominal cramping with it.  Genitourinary: Negative for dyspareunia.  Musculoskeletal: Negative for back pain.     Physical Exam Updated Vital Signs BP 134/83 (BP Location: Right Arm)   Pulse 100   Temp 98.4 F (36.9 C) (Oral)   Resp 18   LMP 03/20/2016   SpO2 100%   Physical Exam  Constitutional: She appears well-developed.  HENT:  Head: Atraumatic.  Eyes: EOM are normal.  Neck: Neck supple.  Cardiovascular: Normal rate.   Pulmonary/Chest: Effort normal.  Abdominal: Soft. There is no tenderness.  Musculoskeletal: She exhibits edema and tenderness.  Edema to his left lower leg. Tender on  the posterior calf area. Dorsalis pedis pulse intact. No skin color changes. Right lower leg normal. Patient does have tattoos.  Neurological: She is alert.  Skin: Skin is warm. Capillary refill takes less than 2 seconds.     ED Treatments / Results  Labs (all labs ordered are listed, but only abnormal results are displayed) Labs Reviewed - No data to display  EKG  EKG Interpretation None       Radiology No results found.  Procedures Procedures (including critical care time)  Medications Ordered in ED Medications  enoxaparin (LOVENOX) injection 1 mg/kg (not administered)     Initial Impression / Assessment and Plan / ED Course  I have reviewed the triage vital signs and the nursing notes.  Pertinent labs & imaging results that were available  during my care of the patient were reviewed by me and considered in my medical decision making (see chart for details).  Clinical Course    Patient with edema of lower leg. Cannot get a Doppler at this time a day. No chest pain or shortness of breath to make me worry about pulmonary embolism. Will get a shot of Lovenox and have outpatient Doppler done tomorrow. This was had mild constipation. Will give mineral lax.  Final Clinical Impressions(s) / ED Diagnoses   Final diagnoses:  Left leg swelling  Constipation, unspecified constipation type    New Prescriptions New Prescriptions   POLYETHYLENE GLYCOL (MIRALAX) PACKET    Take 17 g by mouth daily as needed for mild constipation.     Davonna Belling, MD 04/18/16 [phone]

## 2016-04-18 NOTE — Discharge Instructions (Signed)
Follow-up tomorrow for the Doppler. Return sooner for chest pain shortness of breath or coughing up blood.

## 2016-04-19 ENCOUNTER — Ambulatory Visit (HOSPITAL_COMMUNITY)
Admission: RE | Admit: 2016-04-19 | Discharge: 2016-04-19 | Disposition: A | Discharge status: Home / Self Care | Payer: Self-pay | Source: Ambulatory Visit | Attending: Emergency Medicine | Admitting: Emergency Medicine

## 2016-04-19 DIAGNOSIS — R600 Localized edema: Secondary | ICD-10-CM | POA: Insufficient documentation

## 2018-06-30 ENCOUNTER — Encounter (HOSPITAL_COMMUNITY): Payer: Self-pay | Admitting: Emergency Medicine

## 2018-06-30 ENCOUNTER — Other Ambulatory Visit: Payer: Self-pay

## 2018-06-30 ENCOUNTER — Emergency Department (HOSPITAL_COMMUNITY)
Admission: EM | Admit: 2018-06-30 | Discharge: 2018-06-30 | Disposition: A | Discharge status: Home / Self Care | Payer: Self-pay | Attending: Emergency Medicine | Admitting: Emergency Medicine

## 2018-06-30 ENCOUNTER — Emergency Department (HOSPITAL_COMMUNITY): Payer: Self-pay

## 2018-06-30 DIAGNOSIS — F17210 Nicotine dependence, cigarettes, uncomplicated: Secondary | ICD-10-CM | POA: Insufficient documentation

## 2018-06-30 DIAGNOSIS — R42 Dizziness and giddiness: Secondary | ICD-10-CM | POA: Insufficient documentation

## 2018-06-30 DIAGNOSIS — Z79899 Other long term (current) drug therapy: Secondary | ICD-10-CM | POA: Insufficient documentation

## 2018-06-30 DIAGNOSIS — I10 Essential (primary) hypertension: Secondary | ICD-10-CM | POA: Insufficient documentation

## 2018-06-30 LAB — URINALYSIS, ROUTINE W REFLEX MICROSCOPIC
BILIRUBIN URINE: NEGATIVE
Bacteria, UA: NONE SEEN
Glucose, UA: NEGATIVE mg/dL
Ketones, ur: NEGATIVE mg/dL
LEUKOCYTES UA: NEGATIVE
Nitrite: NEGATIVE
Protein, ur: NEGATIVE mg/dL
SPECIFIC GRAVITY, URINE: 1.006 (ref 1.005–1.030)
pH: 6 (ref 5.0–8.0)

## 2018-06-30 LAB — CBC
HEMATOCRIT: 45.3 % (ref 36.0–46.0)
HEMOGLOBIN: 15.1 g/dL — AB (ref 12.0–15.0)
MCH: 32.8 pg (ref 26.0–34.0)
MCHC: 33.3 g/dL (ref 30.0–36.0)
MCV: 98.5 fL (ref 80.0–100.0)
Platelets: 301 10*3/uL (ref 150–400)
RBC: 4.6 MIL/uL (ref 3.87–5.11)
RDW: 13.3 % (ref 11.5–15.5)
WBC: 11.1 10*3/uL — ABNORMAL HIGH (ref 4.0–10.5)
nRBC: 0 % (ref 0.0–0.2)

## 2018-06-30 LAB — HEPATIC FUNCTION PANEL
ALT: 16 U/L (ref 0–44)
AST: 16 U/L (ref 15–41)
Albumin: 3.8 g/dL (ref 3.5–5.0)
Alkaline Phosphatase: 75 U/L (ref 38–126)
BILIRUBIN DIRECT: 0.1 mg/dL (ref 0.0–0.2)
BILIRUBIN INDIRECT: 0.5 mg/dL (ref 0.3–0.9)
TOTAL PROTEIN: 7 g/dL (ref 6.5–8.1)
Total Bilirubin: 0.6 mg/dL (ref 0.3–1.2)

## 2018-06-30 LAB — DIFFERENTIAL
BASOS ABS: 0.1 10*3/uL (ref 0.0–0.1)
BASOS PCT: 1 %
EOS ABS: 0.2 10*3/uL (ref 0.0–0.5)
EOS PCT: 2 %
LYMPHS ABS: 3.4 10*3/uL (ref 0.7–4.0)
Lymphocytes Relative: 31 %
Monocytes Absolute: 0.6 10*3/uL (ref 0.1–1.0)
Monocytes Relative: 6 %
NEUTROS PCT: 61 %
Neutro Abs: 6.7 10*3/uL (ref 1.7–7.7)

## 2018-06-30 LAB — BASIC METABOLIC PANEL
Anion gap: 7 (ref 5–15)
BUN: 7 mg/dL (ref 6–20)
CHLORIDE: 108 mmol/L (ref 98–111)
CO2: 23 mmol/L (ref 22–32)
CREATININE: 0.96 mg/dL (ref 0.44–1.00)
Calcium: 8.9 mg/dL (ref 8.9–10.3)
GFR calc Af Amer: >60 mL/min (ref >60)
GFR calc non Af Amer: >60 mL/min (ref >60)
Glucose, Bld: 110 mg/dL — ABNORMAL HIGH (ref 70–99)
POTASSIUM: 4 mmol/L (ref 3.5–5.1)
Sodium: 138 mmol/L (ref 135–145)

## 2018-06-30 LAB — CBG MONITORING, ED: Glucose-Capillary: 102 mg/dL — ABNORMAL HIGH (ref 70–99)

## 2018-06-30 LAB — I-STAT BETA HCG BLOOD, ED (MC, WL, AP ONLY)

## 2018-06-30 MED ORDER — SODIUM CHLORIDE 0.9 % IV BOLUS
500.0000 mL | Freq: Once | INTRAVENOUS | Status: AC
  Administered 2018-06-30: 500 mL via INTRAVENOUS

## 2018-06-30 MED ORDER — METOPROLOL TARTRATE 50 MG PO TABS: 50.0000 mg | ORAL_TABLET | Freq: Two times a day (BID) | ORAL | 1 refills | Status: DC

## 2018-06-30 MED ORDER — AMLODIPINE BESYLATE 10 MG PO TABS: 10.0000 mg | ORAL_TABLET | Freq: Every day | ORAL | 1 refills | Status: DC

## 2018-06-30 MED ORDER — KETOROLAC TROMETHAMINE 30 MG/ML IJ SOLN
30.0000 mg | Freq: Once | INTRAMUSCULAR | Status: AC
  Administered 2018-06-30: 30 mg via INTRAVENOUS
  Filled 2018-06-30: qty 1

## 2018-06-30 NOTE — ED Triage Notes (Signed)
Patent [facility] for orthostatic hypotension. Per report patient has had occasional chest tightness, occasional blurred vision and dizziness, has had a headache and been "seeing  black spots," and complains of fatigue.

## 2018-06-30 NOTE — Discharge Instructions (Addendum)
Follow up for bp check in 1-2 weeks.   Take Tylenol or Motrin for headaches.

## 2018-06-30 NOTE — ED Provider Notes (Signed)
[facility] EMERGENCY DEPARTMENT Provider Note   CSN: [id number] Arrival date & time: 06/30/18  1557     History   Chief Complaint Chief Complaint  Patient presents with  . Dizziness    HPI [name] is a 42 y.o. female.  Patient states she has run out of her blood pressure medicine since she has been having headaches and dizziness  The history is provided by the patient. No language interpreter was used.  Dizziness  Quality:  Lightheadedness Severity:  Moderate Onset quality:  Gradual Timing:  Constant Progression:  Waxing and waning Chronicity:  New Context: not when bending over   Relieved by:  Nothing Worsened by:  Nothing Ineffective treatments:  None tried Associated symptoms: no chest pain, no diarrhea and no headaches     Past Medical History:  Diagnosis Date  . Depression   . Hypertension     There are no active problems to display for this patient.   Past Surgical History:  Procedure Laterality Date  . CHOLECYSTECTOMY    . TUBAL LIGATION       OB History    Gravida  3   Para  3   Term  3   Preterm      AB      Living  3     SAB      TAB      Ectopic      Multiple      Live Births               Home Medications    Prior to Admission medications   Medication Sig Start Date End Date Taking? Authorizing Provider  ibuprofen (ADVIL,MOTRIN) 200 MG tablet Take 600 mg by mouth daily as needed for moderate pain.   Yes [provider]  omeprazole (PRILOSEC OTC) 20 MG tablet Take 20 mg by mouth daily.   Yes [provider]  amLODipine (NORVASC) 10 MG tablet Take 1 tablet (10 mg total) by mouth daily. 06/30/18   Milton Ferguson, MD  metoprolol tartrate (LOPRESSOR) 50 MG tablet Take 1 tablet (50 mg total) by mouth 2 (two) times daily. 06/30/18   Milton Ferguson, MD    Family History Family History  Problem Relation Age of Onset  . Hypertension Father   . Hypercholesterolemia Father     Social  History Social History   Tobacco Use  . Smoking status: Current Every Day Smoker    Packs/day: 1.50    Years: 22.00    Pack years: 33.00    Types: Cigarettes  . Smokeless tobacco: Never Used  Substance Use Topics  . Alcohol use: Not Currently    Comment: twice a month  . Drug use: No     Allergies   Other   Review of Systems Review of Systems  Constitutional: Negative for appetite change and fatigue.  HENT: Negative for congestion, ear discharge and sinus pressure.   Eyes: Negative for discharge.  Respiratory: Negative for cough.   Cardiovascular: Negative for chest pain.  Gastrointestinal: Negative for abdominal pain and diarrhea.  Genitourinary: Negative for frequency and hematuria.  Musculoskeletal: Negative for back pain.  Skin: Negative for rash.  Neurological: Positive for dizziness. Negative for seizures and headaches.  Psychiatric/Behavioral: Negative for hallucinations.     Physical Exam Updated Vital Signs BP (!) 136/95   Pulse 81   Temp 98 F (36.7 C) (Oral)   Resp 17   Ht 5\' 2"  (1.575 m)   Wt 95.3  kg   LMP 06/29/2018   SpO2 99%   BMI 38.41 kg/m   Physical Exam Constitutional:      Appearance: She is well-developed.  HENT:     Head: Normocephalic.     Nose: Nose normal.     Mouth/Throat:     Mouth: Mucous membranes are moist.  Eyes:     General: No scleral icterus.    Conjunctiva/sclera: Conjunctivae normal.  Neck:     Musculoskeletal: Neck supple.     Thyroid: No thyromegaly.  Cardiovascular:     Rate and Rhythm: Normal rate and regular rhythm.     Heart sounds: No murmur. No friction rub. No gallop.   Pulmonary:     Breath sounds: No stridor. No wheezing or rales.  Chest:     Chest wall: No tenderness.  Abdominal:     General: There is no distension.     Tenderness: There is no abdominal tenderness. There is no rebound.  Musculoskeletal: Normal range of motion.  Lymphadenopathy:     Cervical: No cervical adenopathy.  Skin:     Findings: No erythema or rash.  Neurological:     Mental Status: She is oriented to person, place, and time.     Motor: No abnormal muscle tone.     Coordination: Coordination normal.  Psychiatric:        Behavior: Behavior normal.      ED Treatments / Results  Labs (all labs ordered are listed, but only abnormal results are displayed) Labs Reviewed  BASIC METABOLIC PANEL - Abnormal; Notable for the following components:      Result Value   Glucose, Bld 110 (*)    All other components within normal limits  CBC - Abnormal; Notable for the following components:   WBC 11.1 (*)    Hemoglobin 15.1 (*)    All other components within normal limits  URINALYSIS, ROUTINE W REFLEX MICROSCOPIC - Abnormal; Notable for the following components:   Color, Urine STRAW (*)    Hgb urine dipstick LARGE (*)    All other components within normal limits  CBG MONITORING, ED - Abnormal; Notable for the following components:   Glucose-Capillary 102 (*)    All other components within normal limits  HEPATIC FUNCTION PANEL  DIFFERENTIAL  I-STAT BETA HCG BLOOD, ED ([facility], [facility], [facility] ONLY)    EKG None  Radiology Ct Head Wo Contrast  Result Date: 06/30/2018 CLINICAL DATA:  Headache.  Worst headache of life. EXAM: CT HEAD WITHOUT CONTRAST TECHNIQUE: Contiguous axial images were obtained from the base of the skull through the vertex without intravenous contrast. COMPARISON:  01/12/2016 FINDINGS: Brain: No evidence of acute infarction, hemorrhage, hydrocephalus, extra-axial collection or mass lesion/mass effect. Vascular: No hyperdense vessel or unexpected calcification. Skull: Normal. Negative for fracture or focal lesion. Sinuses/Orbits: No acute finding. Other: None. IMPRESSION: Negative head CT. Electronically Signed   By: Markus Daft M.D.   On: 06/30/2018 19:16    Procedures Procedures (including critical care time)  Medications Ordered in ED Medications  ketorolac (TORADOL) 30 MG/ML injection 30 mg (30  mg Intravenous Given 06/30/18 1827)  sodium chloride 0.9 % bolus 500 mL (500 mLs Intravenous New Bag/Given 06/30/18 1827)     Initial Impression / Assessment and Plan / ED Course  I have reviewed the triage vital signs and the nursing notes.  Pertinent labs & imaging results that were available during my care of the patient were reviewed by me and considered in my medical decision making (  see chart for details).     Labs CT scan unremarkable.  Patient's headache improved with Toradol.  Suspect headache and dizziness is related to not take her blood pressure medicine.  We will start her back on Norvasc and Lopressor  Final Clinical Impressions(s) / ED Diagnoses   Final diagnoses:  Dizziness    ED Discharge Orders         Ordered    amLODipine (NORVASC) 10 MG tablet  Daily     06/30/18 2017    metoprolol tartrate (LOPRESSOR) 50 MG tablet  2 times daily     06/30/18 2017           Milton Ferguson, MD 06/30/18 2022

## 2018-12-29 ENCOUNTER — Other Ambulatory Visit: Payer: Self-pay

## 2018-12-29 ENCOUNTER — Emergency Department (HOSPITAL_COMMUNITY)
Admission: EM | Admit: 2018-12-29 | Discharge: 2018-12-29 | Disposition: A | Discharge status: Home / Self Care | Payer: Self-pay

## 2019-02-28 ENCOUNTER — Emergency Department (HOSPITAL_COMMUNITY)
Admission: EM | Admit: 2019-02-28 | Discharge: 2019-02-28 | Disposition: A | Discharge status: Home / Self Care | Payer: Self-pay | Attending: Emergency Medicine | Admitting: Emergency Medicine

## 2019-02-28 ENCOUNTER — Emergency Department (HOSPITAL_COMMUNITY): Payer: Self-pay

## 2019-02-28 ENCOUNTER — Other Ambulatory Visit: Payer: Self-pay

## 2019-02-28 DIAGNOSIS — F17210 Nicotine dependence, cigarettes, uncomplicated: Secondary | ICD-10-CM | POA: Insufficient documentation

## 2019-02-28 DIAGNOSIS — Z7982 Long term (current) use of aspirin: Secondary | ICD-10-CM | POA: Insufficient documentation

## 2019-02-28 DIAGNOSIS — M5412 Radiculopathy, cervical region: Secondary | ICD-10-CM | POA: Insufficient documentation

## 2019-02-28 DIAGNOSIS — R531 Weakness: Secondary | ICD-10-CM | POA: Insufficient documentation

## 2019-02-28 DIAGNOSIS — Z79899 Other long term (current) drug therapy: Secondary | ICD-10-CM | POA: Insufficient documentation

## 2019-02-28 DIAGNOSIS — I10 Essential (primary) hypertension: Secondary | ICD-10-CM | POA: Insufficient documentation

## 2019-02-28 DIAGNOSIS — M4802 Spinal stenosis, cervical region: Secondary | ICD-10-CM | POA: Insufficient documentation

## 2019-02-28 LAB — DIFFERENTIAL
Abs Immature Granulocytes: 0.03 10*3/uL (ref 0.00–0.07)
Basophils Absolute: 0.1 10*3/uL (ref 0.0–0.1)
Basophils Relative: 1 %
Eosinophils Absolute: 0.2 10*3/uL (ref 0.0–0.5)
Eosinophils Relative: 2 %
Immature Granulocytes: 0 %
Lymphocytes Relative: 29 %
Lymphs Abs: 3.2 10*3/uL (ref 0.7–4.0)
Monocytes Absolute: 0.7 10*3/uL (ref 0.1–1.0)
Monocytes Relative: 7 %
Neutro Abs: 6.8 10*3/uL (ref 1.7–7.7)
Neutrophils Relative %: 61 %

## 2019-02-28 LAB — RAPID URINE DRUG SCREEN, HOSP PERFORMED
Amphetamines: NOT DETECTED
Barbiturates: NOT DETECTED
Benzodiazepines: POSITIVE — AB
Cocaine: NOT DETECTED
Opiates: NOT DETECTED
Tetrahydrocannabinol: NOT DETECTED

## 2019-02-28 LAB — COMPREHENSIVE METABOLIC PANEL
ALT: 18 U/L (ref 0–44)
AST: 17 U/L (ref 15–41)
Albumin: 4 g/dL (ref 3.5–5.0)
Alkaline Phosphatase: 91 U/L (ref 38–126)
Anion gap: 10 (ref 5–15)
BUN: 10 mg/dL (ref 6–20)
CO2: 24 mmol/L (ref 22–32)
Calcium: 8.6 mg/dL — ABNORMAL LOW (ref 8.9–10.3)
Chloride: 102 mmol/L (ref 98–111)
Creatinine, Ser: 0.86 mg/dL (ref 0.44–1.00)
GFR calc Af Amer: >60 mL/min (ref >60)
GFR calc non Af Amer: >60 mL/min (ref >60)
Glucose, Bld: 87 mg/dL (ref 70–99)
Potassium: 3.7 mmol/L (ref 3.5–5.1)
Sodium: 136 mmol/L (ref 135–145)
Total Bilirubin: 0.4 mg/dL (ref 0.3–1.2)
Total Protein: 7.5 g/dL (ref 6.5–8.1)

## 2019-02-28 LAB — CBC
HCT: 46.8 % — ABNORMAL HIGH (ref 36.0–46.0)
Hemoglobin: 16 g/dL — ABNORMAL HIGH (ref 12.0–15.0)
MCH: 34.6 pg — ABNORMAL HIGH (ref 26.0–34.0)
MCHC: 34.2 g/dL (ref 30.0–36.0)
MCV: 101.1 fL — ABNORMAL HIGH (ref 80.0–100.0)
Platelets: 297 10*3/uL (ref 150–400)
RBC: 4.63 MIL/uL (ref 3.87–5.11)
RDW: 14 % (ref 11.5–15.5)
WBC: 10.9 10*3/uL — ABNORMAL HIGH (ref 4.0–10.5)
nRBC: 0 % (ref 0.0–0.2)

## 2019-02-28 LAB — URINALYSIS, ROUTINE W REFLEX MICROSCOPIC
Bilirubin Urine: NEGATIVE
Glucose, UA: NEGATIVE mg/dL
Hgb urine dipstick: NEGATIVE
Ketones, ur: NEGATIVE mg/dL
Leukocytes,Ua: NEGATIVE
Nitrite: NEGATIVE
Protein, ur: NEGATIVE mg/dL
Specific Gravity, Urine: 1.012 (ref 1.005–1.030)
pH: 7 (ref 5.0–8.0)

## 2019-02-28 LAB — PROTIME-INR
INR: 1 (ref 0.8–1.2)
Prothrombin Time: 13 seconds (ref 11.4–15.2)

## 2019-02-28 LAB — POC URINE PREG, ED: Preg Test, Ur: NEGATIVE

## 2019-02-28 LAB — TROPONIN I (HIGH SENSITIVITY)
Troponin I (High Sensitivity): 4 ng/L (ref <18)
Troponin I (High Sensitivity): <2 ng/L (ref <18)

## 2019-02-28 LAB — APTT: aPTT: 28 seconds (ref 24–36)

## 2019-02-28 LAB — ETHANOL: Alcohol, Ethyl (B): <10 mg/dL (ref <10)

## 2019-02-28 MED ORDER — IBUPROFEN 600 MG PO TABS: 600.0000 mg | ORAL_TABLET | Freq: Four times a day (QID) | ORAL | 0 refills | Status: DC | PRN

## 2019-02-28 MED ORDER — HYDROCODONE-ACETAMINOPHEN 5-325 MG PO TABS
1.0000 | ORAL_TABLET | Freq: Once | ORAL | Status: AC
  Administered 2019-02-28: 16:00:00 1 via ORAL
  Filled 2019-02-28: qty 1

## 2019-02-28 NOTE — Discharge Instructions (Addendum)
Avoid any activity that worsens your neck pain.   Apply a heating pad for 20 minutes several times daily.  Get rechecked immediately for any worsened weakness or numbness in your arm.  Plan to see Dr. [name] as discussed.

## 2019-02-28 NOTE — ED Provider Notes (Signed)
Medical screening examination/treatment/procedure(s) were conducted as a shared visit with non-physician practitioner(s) and myself.  I personally evaluated the patient during the encounter.  Clinical Impression:   Final diagnoses:  Degenerative cervical spinal stenosis  Cervical radiculopathy   This patient is a pleasant 43 year old female, being treated for hypertension with Lopressor and Norvasc, presents to the hospital today with a complaint of left arm discomfort and heaviness.  She reports that she had a brief episode of chest pain earlier in the day followed by pain going down her left arm and a feeling of her hand being swollen and her arm being heavy.  On my exam she has no pronator drift, she is able to grip bilaterally though her left side seems to be work a little bit slower.  She is able to perform finger-nose-finger bilaterally but on the left side it takes significant more time than it does on the right.  She has no weakness in her legs, she states that this sensation in the left arm is different as well.  Cranial nerves III through XII are normal, gross visual acuity is normal, peripheral visual fields are normal as is her speech.  Will need consultation with neurology, stroke work-up has been initiated with CT scan.  She is outside the 4-1/2-hour window for TPA and does not appear to have a large vessel occlusion.   Noemi Chapel, MD 03/04/19 [phone]

## 2019-02-28 NOTE — ED Notes (Signed)
EKG done and printed, export failed. CN and secretary made aware that export failed.  EKG handed to [name], [place].

## 2019-02-28 NOTE — ED Triage Notes (Signed)
Pt with sharp cp this morning and eased off, pt then began to have swelling and pain to left arm.  Pt also c/o of being sleepy.

## 2019-02-28 NOTE — ED Notes (Signed)
Tele neurologist evaluating pt at this time.

## 2019-02-28 NOTE — Consult Note (Signed)
[facility] TeleNeurology Consult Services  Stat Consult  Date of Service:   02/28/2019 13:44:17  Impression:     .  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 She does have degenerative changes seen on her CT of the cervical spine and would recommend starting with an MRI of the cervical spine without contrast. Symptoms do not seem consistent with any sort of probable vascular event although this is possible.given that she has weakness and numbness of the arm and that her symptoms appear to be getting worse would recommend that she get the MRI as an inpatient and if needed to be seen by orthopedic or neurosurgery. If no structural cause is found for her symptoms additional neurodiagnostic testing could be considered such as MRI of the brain for EMG and nerve conduction testing  CT HEAD: Showed No Acute Hemorrhage or Acute Core Infarct  Metrics: [facility] Notification Time: 02/28/2019 13:41:39 Stamp Time: 02/28/2019 13:44:17 Callback Response Time: 02/28/2019 13:52:51 Video Start Time: 02/28/2019 14:11:47 Video End Time: 02/28/2019 14:24:20  Our recommendations are outlined below.   Therapies:     .  Physical Therapy, Occupational Therapy, Speech Therapy Assessment When Applicable  Disposition: Neurology Follow Up Recommended  Sign Out:     .  Discussed with Emergency Department Provider  ----------------------------------------------------------------------------------------------------  Chief Complaint: left arm numbness and pain  History of Present Illness: Patient is a 43 year old Female.  She says at 7 am she was getting her granson on and she had a sharp pain in her chest then her left arm went numb and weak with swelling in her arm.  The pain was coming and going and her arm continues to feel heavy along with pins and needles. She has a severe headache also for several days. Sometime its sharp and shooting. Has HTN and tachycardia. CT cervical spine in ED:1. Negative for cervical spine fracture 2. 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 3. Central disc protrusion and spurring C5-6 with moderate spinal stenosis and mild foraminal narrowing bilaterally 4. Disc degeneration and spondylosis and O PLL at C6-7 causing moderate to severe spinal stenosis and moderate foraminal encroachment bilaterally SHe has pain in her neck and if she moves it hurts more. If she moves her arm it doesn't seem to change the numbness in her arm but it does worsen the pain in her neck and shoulder.  Examination: BP(112/65), Pulse(94),  Neuro Exam:  General: Alert,Awake, Oriented to Time, Place, Person  Speech: Fluent:  Language: Intact:  Face: Symmetric:  Facial Sensation: Intact:  Visual Fields: Intact:  Extraocular Movements: Intact:  Motor Exam: Drift: LUE  Sensation: Reduced: LUE  Coordination: Intact:  She has weakness in her LUE with biceps triceps and grip strength and numbness in her left arm that encompasses most of the arms although its worst fingers where she can't feel anything at all and all 5 fingers.  Patient/Family was informed the Neurology Consult would happen via TeleHealth consult by way of interactive audio and video telecommunications and consented to receiving care in this manner.  Due to the immediate potential for life-threatening deterioration due to underlying acute neurologic illness, I spent 35 minutes providing critical care. This time includes time for face to face visit via telemedicine, review of medical records, imaging studies  and discussion of findings with providers, the patient and/or family.   Dr Katina Degree   [facility] [phone]

## 2019-02-28 NOTE — ED Provider Notes (Signed)
[facility] EMERGENCY DEPARTMENT Provider Note   CSN: [id number] Arrival date & time: 02/28/19  1225    History   Chief Complaint Chief Complaint  Patient presents with  . Chest Pain    HPI [name] is a 43 y.o. female with a history of depression hypertension who developed midsternal sharp chest pain which radiated into her left lower arm around 730 this morning and lasted for about 10 minutes.  She noticed swelling in her fingers of the left hand during this occurrence.  She laid down for a while and the swelling in her fingers has improved and her chest pain has resolved but she now endorses heavy sensation and weakness in the left upper extremity.  She also endorses developing a posterior headache which she states has been intermittently present for the past several days.  She also reports posterior neck pain which is also been present for several days.  She denies any injuries or falls.  She has had no treatment prior to arrival.  Other pertinent negatives include shortness of breath, nausea, vomiting or diaphoresis.  She also denies abdominal pain.     The history is provided by the patient.    Past Medical History:  Diagnosis Date  . Depression   . Hypertension     There are no active problems to display for this patient.   Past Surgical History:  Procedure Laterality Date  . CHOLECYSTECTOMY    . TUBAL LIGATION       OB History    Gravida  3   Para  3   Term  3   Preterm      AB      Living  3     SAB      TAB      Ectopic      Multiple      Live Births               Home Medications    Prior to Admission medications   Medication Sig Start Date End Date Taking? Authorizing Provider  amLODipine (NORVASC) 10 MG tablet Take 1 tablet (10 mg total) by mouth daily. Patient taking differently: Take 10 mg by mouth every evening.  06/30/18  Yes Milton Ferguson, MD  aspirin EC 81 MG tablet Take 81 mg by mouth once as needed for mild pain  or moderate pain.   Yes [provider]  calcium carbonate (TUMS - DOSED IN MG ELEMENTAL CALCIUM) 500 MG chewable tablet Chew 1 tablet by mouth daily as needed for indigestion or heartburn.   Yes [provider]  metoprolol tartrate (LOPRESSOR) 50 MG tablet Take 1 tablet (50 mg total) by mouth 2 (two) times daily. 06/30/18  Yes Milton Ferguson, MD  Omega-3 Fatty Acids (FISH OIL) 1000 MG CAPS Take 1,000 mg by mouth 2 (two) times daily.   Yes [provider]  omeprazole (PRILOSEC OTC) 20 MG tablet Take 20 mg by mouth daily.   Yes [provider]  Vitamin D, Ergocalciferol, (DRISDOL) 1.25 MG (50000 UT) CAPS capsule Take 50,000 Units by mouth every Tuesday.   Yes [provider]  ibuprofen (ADVIL) 600 MG tablet Take 1 tablet (600 mg total) by mouth every 6 (six) hours as needed for headache or moderate pain. 02/28/19   [name], PA-C    Family History Family History  Problem Relation Age of Onset  . Hypertension Father   . Hypercholesterolemia Father     Social History Social  History   Tobacco Use  . Smoking status: Current Every Day Smoker    Packs/day: 1.50    Years: 22.00    Pack years: 33.00    Types: Cigarettes  . Smokeless tobacco: Never Used  Substance Use Topics  . Alcohol use: Not Currently    Comment: twice a month  . Drug use: No     Allergies   Other   Review of Systems Review of Systems  Constitutional: Negative for diaphoresis and fever.  HENT: Negative for congestion and sore throat.   Eyes: Negative.   Respiratory: Negative for chest tightness and shortness of breath.   Cardiovascular: Positive for chest pain.  Gastrointestinal: Negative for abdominal pain, nausea and vomiting.  Genitourinary: Negative.   Musculoskeletal: Negative for arthralgias, joint swelling and neck pain.       Finger edema   Skin: Negative.  Negative for rash and wound.  Neurological: Positive for weakness, numbness and headaches.  Negative for dizziness, facial asymmetry and light-headedness.  Psychiatric/Behavioral: Negative.      Physical Exam Updated Vital Signs BP (!) 137/100 (BP Location: Left Arm)   Pulse 85   Temp 98.6 F (37 C) (Oral)   Resp 20   Ht 5\' 3"  (1.6 m)   Wt 95.3 kg   LMP 01/31/2019   SpO2 99%   BMI 37.20 kg/m   Physical Exam Vitals signs and nursing note reviewed.  Constitutional:      Appearance: She is well-developed.  HENT:     Head: Normocephalic and atraumatic.  Eyes:     General: No visual field deficit.    Conjunctiva/sclera: Conjunctivae normal.  Neck:     Musculoskeletal: Normal range of motion and neck supple.  Cardiovascular:     Rate and Rhythm: Normal rate and regular rhythm.     Pulses:          Radial pulses are 2+ on the right side and 2+ on the left side.       Dorsalis pedis pulses are 2+ on the right side and 2+ on the left side.     Heart sounds: Normal heart sounds. No murmur.  Pulmonary:     Effort: Pulmonary effort is normal.     Breath sounds: Normal breath sounds. No wheezing.  Chest:     Chest wall: No tenderness.  Abdominal:     General: Abdomen is protuberant. Bowel sounds are normal.     Palpations: Abdomen is soft.     Tenderness: There is no abdominal tenderness.  Musculoskeletal: Normal range of motion.  Skin:    General: Skin is warm and dry.       Neurological:     Mental Status: She is alert and oriented to person, place, and time.     Cranial Nerves: Cranial nerves are intact. No cranial nerve deficit, dysarthria or facial asymmetry.     Sensory: Sensory deficit present.     Motor: Motor function is intact. No pronator drift.     Coordination: Heel to [facility] Test normal.     Deep Tendon Reflexes:     Reflex Scores:      Bicep reflexes are 2+ on the right side and 2+ on the left side.    Comments: Sensation present in all dermatomes of left arm, but pt reports feels lighter than the right.  5/5 grip right, 4/5 left.  Heel shin normal  bilateral, finger nose present bilaterally, slower using left arm. No pronator drift.  ED Treatments / Results  Labs (all labs ordered are listed, but only abnormal results are displayed) Labs Reviewed  CBC - Abnormal; Notable for the following components:      Result Value   WBC 10.9 (*)    Hemoglobin 16.0 (*)    HCT 46.8 (*)    MCV 101.1 (*)    MCH 34.6 (*)    All other components within normal limits  COMPREHENSIVE METABOLIC PANEL - Abnormal; Notable for the following components:   Calcium 8.6 (*)    All other components within normal limits  RAPID URINE DRUG SCREEN, HOSP PERFORMED - Abnormal; Notable for the following components:   Benzodiazepines POSITIVE (*)    All other components within normal limits  PROTIME-INR  APTT  DIFFERENTIAL  URINALYSIS, ROUTINE W REFLEX MICROSCOPIC  ETHANOL  POC URINE PREG, ED  TROPONIN I (HIGH SENSITIVITY)  TROPONIN I (HIGH SENSITIVITY)    EKG EKG Interpretation  Date/Time:  Monday February 28 2019 12:47:58 EDT Ventricular Rate:  93 PR Interval:    QRS Duration: 90 QT Interval:  371 QTC Calculation: 462 R Axis:   38 Text Interpretation:  Sinus rhythm Low voltage, precordial leads since last tracing no significant change Confirmed by Noemi Chapel [phone]) on 02/28/2019 2:56:27 PM   Radiology Mr Cervical Spine Wo Contrast  Result Date: 02/28/2019 CLINICAL DATA:  Acute onset neck and left arm pain this morning. Left-sided weakness. No known injury. EXAM: MRI CERVICAL SPINE WITHOUT CONTRAST TECHNIQUE: Multiplanar, multisequence MR imaging of the cervical spine was performed. No intravenous contrast was administered. COMPARISON:  None. FINDINGS: Alignment: Maintained with straightening of lordosis. Vertebrae: No fracture, evidence of discitis, or bone lesion. Degenerative endplate signal change in C5 eccentric to the right noted. Cord: Normal signal throughout. Posterior Fossa, vertebral arteries, paraspinal tissues: Negative. Disc  levels: C2-3: Shallow disc bulge without stenosis. C3-4: Small central disc protrusion contacts the ventral cord but the central canal and foramina remain open. C4-5: Broad-based central protrusion mildly indents the ventral cord. Foramina are open. C5-6: Show broad-based disc bulge mildly flattens the ventral cord. Mild bilateral foraminal narrowing is present. C6-7: Diffuse broad-based disc bulge flattens the cord. Thickening of the posterior longitudinal ligament posterior to C6 also contributes to cord flattening. Moderate to moderately severe bilateral foraminal narrowing due to uncovertebral disease is seen. C7-T1: Shallow disc bulge. IMPRESSION: Spondylosis appears worst at C6-7 where a broad-based disc bulge flattens the cord and uncovertebral disease causes moderate to moderately severe bilateral foraminal narrowing. Posterior longitudinal ligament posterior to C6 is thickened and contributes to cord flattening. Shallow disc bulge at C5-6 mildly flattens the ventral cord. Broad-based central protrusion mildly indents the ventral cord at C4-5. Small central disc protrusion at C3-4 contacts the cord without central canal stenosis. Electronically Signed   By: Inge Rise M.D.   On: 02/28/2019 15:42    Procedures Procedures (including critical care time)  Medications Ordered in ED Medications  HYDROcodone-acetaminophen (NORCO/VICODIN) 5-325 MG per tablet 1 tablet (1 tablet Oral Given 02/28/19 1609)     Initial Impression / Assessment and Plan / ED Course  I have reviewed the triage vital signs and the nursing notes.  Pertinent labs & imaging results that were available during my care of the patient were reviewed by me and considered in my medical decision making (see chart for details).        CT imaging reviewed and labs interpreted.  No CP during ed stay, delta trops negative, ekg stable. discussed pt with  teleneurologist who also evaluated pt.  Favors cervical neuropathy/msk source  rather than central source of sx.  Recommends MRI of c spine as first step in eval, holding mri brain suspected low yield.  MRI obtained and reviewed sig for spondylosis with sig cord flattening at C6-7, lesser C5-6.  Pt re-evaluated prior to dc home - headache resolved after receiving PO hydrocodone,  no dysmetria with repeat finger nose test.  Discussed with Dr. [name] with neurosurgery, who also reviewed imaging and discussed pt sx.  No recommendations at this time, does not encouraged steroids unless sig persistent neuropathy. Will see pt in office for f/u care. Referrals given, pt stable for dc home.   Final Clinical Impressions(s) / ED Diagnoses   Final diagnoses:  Degenerative cervical spinal stenosis  Cervical radiculopathy    ED Discharge Orders         Ordered    ibuprofen (ADVIL) 600 MG tablet  Every 6 hours PRN     02/28/19 1722           [name], PA-C 03/02/19 1403    Noemi Chapel, MD 03/04/19 [phone]

## 2019-02-28 NOTE — Progress Notes (Signed)
CODE STROKE CT TIMES 1305 BEEPER 1312 EXAM STARTED 1314 EXAM FINISHED 1314 IMAGES SENT TO SOC 1314 EXAM COMPLETED 1314 [facility] CALLED

## 2019-03-10 ENCOUNTER — Other Ambulatory Visit: Payer: Self-pay | Admitting: Neurological Surgery

## 2019-03-10 DIAGNOSIS — M5412 Radiculopathy, cervical region: Secondary | ICD-10-CM

## 2019-03-15 ENCOUNTER — Other Ambulatory Visit: Payer: Self-pay

## 2019-03-15 ENCOUNTER — Ambulatory Visit
Admission: RE | Admit: 2019-03-15 | Discharge: 2019-03-15 | Disposition: A | Discharge status: Home / Self Care | Payer: Self-pay | Source: Ambulatory Visit | Attending: Neurological Surgery | Admitting: Neurological Surgery

## 2019-03-15 DIAGNOSIS — M5412 Radiculopathy, cervical region: Secondary | ICD-10-CM

## 2019-03-15 MED ORDER — METHYLPREDNISOLONE ACETATE 40 MG/ML INJ SUSP (RADIOLOG
120.0000 mg | Freq: Once | INTRAMUSCULAR | Status: AC
  Administered 2019-03-15: 120 mg via EPIDURAL

## 2019-03-15 MED ORDER — IOPAMIDOL (ISOVUE-M 300) INJECTION 61%
1.0000 mL | Freq: Once | INTRAMUSCULAR | Status: AC | PRN
  Administered 2019-03-15: 1 mL via EPIDURAL

## 2019-03-15 NOTE — Discharge Instructions (Signed)

## 2019-07-21 ENCOUNTER — Other Ambulatory Visit: Payer: Self-pay

## 2019-07-21 ENCOUNTER — Emergency Department (HOSPITAL_COMMUNITY)
Admission: EM | Admit: 2019-07-21 | Discharge: 2019-07-21 | Disposition: A | Discharge status: Home / Self Care | Payer: Self-pay | Attending: Emergency Medicine | Admitting: Emergency Medicine

## 2019-07-21 ENCOUNTER — Encounter (HOSPITAL_COMMUNITY): Payer: Self-pay | Admitting: Emergency Medicine

## 2019-07-21 DIAGNOSIS — L0291 Cutaneous abscess, unspecified: Secondary | ICD-10-CM

## 2019-07-21 DIAGNOSIS — Z79899 Other long term (current) drug therapy: Secondary | ICD-10-CM | POA: Insufficient documentation

## 2019-07-21 DIAGNOSIS — I10 Essential (primary) hypertension: Secondary | ICD-10-CM | POA: Insufficient documentation

## 2019-07-21 DIAGNOSIS — L02416 Cutaneous abscess of left lower limb: Secondary | ICD-10-CM | POA: Insufficient documentation

## 2019-07-21 DIAGNOSIS — L02214 Cutaneous abscess of groin: Secondary | ICD-10-CM | POA: Insufficient documentation

## 2019-07-21 DIAGNOSIS — F17210 Nicotine dependence, cigarettes, uncomplicated: Secondary | ICD-10-CM | POA: Insufficient documentation

## 2019-07-21 MED ORDER — SULFAMETHOXAZOLE-TRIMETHOPRIM 800-160 MG PO TABS: 1.0000 | ORAL_TABLET | Freq: Two times a day (BID) | ORAL | 0 refills | Status: AC

## 2019-07-21 MED ORDER — POVIDONE-IODINE 10 % EX SOLN
CUTANEOUS | Status: DC | PRN
  Filled 2019-07-21: qty 15

## 2019-07-21 MED ORDER — PENTAFLUOROPROP-TETRAFLUOROETH EX AERO
INHALATION_SPRAY | CUTANEOUS | Status: DC | PRN
  Filled 2019-07-21: qty 116

## 2019-07-21 NOTE — Discharge Instructions (Signed)
Take the entire course of the antibiotics.  Continue using warm water soaks three times daily for 10 minutes or more each , using epsom salts is fine to keep these sites open and draining.  Get rechecked for any worsening redness, swelling or pain.

## 2019-07-21 NOTE — ED Triage Notes (Signed)
Pt reports "bump" on left groin since this am. Pt denies any fever or nausea. nad noted.

## 2019-07-21 NOTE — ED Provider Notes (Signed)
[facility] EMERGENCY DEPARTMENT Provider Note   CSN: [id number] Arrival date & time: 07/21/19  1113     History Chief Complaint  Patient presents with  . Recurrent Skin Infections    [name] is a 44 y.o. female presenting with 2 abscesses, one in the left groin and one lower medial left thigh.  She reports occasional problems with folliculitis or "hair bumps" probably from shaving.  Today the medial thigh abscess woke her due to increasing size and pain.  She has applied warm soaks without spontaneous drainage.  She denies fevers, chills or other complaint.   The history is provided by the patient.       Past Medical History:  Diagnosis Date  . Depression   . Hypertension     There are no problems to display for this patient.   Past Surgical History:  Procedure Laterality Date  . CHOLECYSTECTOMY    . TUBAL LIGATION       OB History    Gravida  3   Para  3   Term  3   Preterm      AB      Living  3     SAB      TAB      Ectopic      Multiple      Live Births              Family History  Problem Relation Age of Onset  . Hypertension Father   . Hypercholesterolemia Father     Social History   Tobacco Use  . Smoking status: Current Every Day Smoker    Packs/day: 1.50    Years: 22.00    Pack years: 33.00    Types: Cigarettes  . Smokeless tobacco: Never Used  Substance Use Topics  . Alcohol use: Not Currently    Comment: twice a month  . Drug use: No    Home Medications Prior to Admission medications   Medication Sig Start Date End Date Taking? Authorizing Provider  amLODipine (NORVASC) 10 MG tablet Take 1 tablet (10 mg total) by mouth daily. Patient taking differently: Take 10 mg by mouth every evening.  06/30/18   Milton Ferguson, MD  aspirin EC 81 MG tablet Take 81 mg by mouth once as needed for mild pain or moderate pain.    [provider]  calcium carbonate (TUMS - DOSED IN MG ELEMENTAL CALCIUM) 500 MG  chewable tablet Chew 1 tablet by mouth daily as needed for indigestion or heartburn.    [provider]  ibuprofen (ADVIL) 600 MG tablet Take 1 tablet (600 mg total) by mouth every 6 (six) hours as needed for headache or moderate pain. 02/28/19   [name], PA-C  metoprolol tartrate (LOPRESSOR) 50 MG tablet Take 1 tablet (50 mg total) by mouth 2 (two) times daily. 06/30/18   Milton Ferguson, MD  Omega-3 Fatty Acids (FISH OIL) 1000 MG CAPS Take 1,000 mg by mouth 2 (two) times daily.    [provider]  omeprazole (PRILOSEC OTC) 20 MG tablet Take 20 mg by mouth daily.    [provider]  sulfamethoxazole-trimethoprim (BACTRIM DS) 800-160 MG tablet Take 1 tablet by mouth 2 (two) times daily for 10 days. 07/21/19 07/31/19  [name], PA-C  Vitamin D, Ergocalciferol, (DRISDOL) 1.25 MG (50000 UT) CAPS capsule Take 50,000 Units by mouth every Tuesday.    [provider]    Allergies    Other  Review of  Systems   Review of Systems  Constitutional: Negative for chills and fever.  Respiratory: Negative.   Cardiovascular: Negative.   Gastrointestinal: Negative.  Negative for nausea.  Genitourinary: Negative.   Musculoskeletal: Negative for arthralgias, joint swelling and neck pain.       Negative except as mentioned in HPI.   Skin: Negative for rash and wound.  Neurological: Negative for dizziness, weakness, light-headedness, numbness and headaches.  Psychiatric/Behavioral: Negative.     Physical Exam Updated Vital Signs BP 125/80 (BP Location: Right Arm)   Pulse 77   Temp 99.2 F (37.3 C) (Oral)   Resp 18   Ht 5\' 3"  (1.6 m)   Wt 81.6 kg   LMP 07/19/2019   SpO2 100%   BMI 31.89 kg/m   Physical Exam Constitutional:      Appearance: She is well-developed.  HENT:     Head: Normocephalic.  Cardiovascular:     Rate and Rhythm: Normal rate.  Pulmonary:     Effort: Pulmonary effort is normal.  Musculoskeletal:        General: No tenderness.  Skin:     Findings: Abscess present.     Comments: 1 cm fluctuant raised abscess left groin crease with no spontaneous drainage and no sig surrounding erythema.  Larger 2 cm raised fluctuant abscess left medial thigh, dark center, 3 cm surrounding erythema.  No red streaking.   Neurological:     Mental Status: She is alert and oriented to person, place, and time.     Sensory: No sensory deficit.     ED Results / Procedures / Treatments   Labs (all labs ordered are listed, but only abnormal results are displayed) Labs Reviewed  AEROBIC CULTURE (SUPERFICIAL SPECIMEN)    EKG None  Radiology No results found.  Procedures Procedures (including critical care time)  INCISION AND DRAINAGE Performed by: [name] Consent: Verbal consent obtained. Risks and benefits: risks, benefits and alternatives were discussed Type: abscess  Body area: left thigh and groin  Anesthesia: local infiltration  Incision was made with a scalpel.  Local anesthetic: n/a  Anesthetic total: topical gebauers freeze spray  Complexity: complex  Blunt dissection to break up loculations  Drainage: purulent  Drainage amount: moderate  Packing material: n/a  Patient tolerance: Patient tolerated the procedure well with no immediate complications.     Medications Ordered in ED Medications  pentafluoroprop-tetrafluoroeth (GEBAUERS) aerosol (has no administration in time range)  povidone-iodine (BETADINE) 10 % external solution (has no administration in time range)    ED Course  I have reviewed the triage vital signs and the nursing notes.  Pertinent labs & imaging results that were available during my care of the patient were reviewed by me and considered in my medical decision making (see chart for details).    MDM Rules/Calculators/A&P                      Pt with abscess x 2 left groin and medial upper thigh.  Tolerated I&D well. Bactrim, warm soaks recommended.  Return precautions outlined.   Culture sent.  Final Clinical Impression(s) / ED Diagnoses Final diagnoses:  Abscess    Rx / DC Orders ED Discharge Orders         Ordered    sulfamethoxazole-trimethoprim (BACTRIM DS) 800-160 MG tablet  2 times daily     07/21/19 1208           [name], PA-C 07/21/19 1210    Hayden Rasmussen,  MD 07/21/19 1736

## 2019-07-24 LAB — AEROBIC CULTURE W GRAM STAIN (SUPERFICIAL SPECIMEN): Special Requests: NORMAL

## 2019-10-28 ENCOUNTER — Ambulatory Visit: Payer: Self-pay

## 2019-10-28 ENCOUNTER — Ambulatory Visit: Payer: Self-pay | Attending: Internal Medicine

## 2019-10-28 DIAGNOSIS — Z23 Encounter for immunization: Secondary | ICD-10-CM

## 2019-10-28 NOTE — Progress Notes (Signed)
   Covid-19 Vaccination Clinic  Name:  [name]    MRN: [id number] DOB: 03-Mar-1976  10/28/2019  [name] was observed post Covid-19 immunization for 15 minutes without incident. She was provided with Vaccine Information Sheet and instruction to access the V-Safe system.   [name] was instructed to call 911 with any severe reactions post vaccine: Marland Kitchen Difficulty breathing  . Swelling of face and throat  . A fast heartbeat  . A bad rash all over body  . Dizziness and weakness   Immunizations Administered    Name Date Dose VIS Date Route   Moderna COVID-19 Vaccine 10/28/2019 11:48 AM 0.5 mL 06/14/2019 Intramuscular   Manufacturer: Moderna   Lot: [id number]   [id number]

## 2019-11-29 ENCOUNTER — Ambulatory Visit: Payer: Self-pay | Attending: Internal Medicine

## 2019-11-29 DIAGNOSIS — Z23 Encounter for immunization: Secondary | ICD-10-CM

## 2019-11-29 NOTE — Progress Notes (Signed)
   Covid-19 Vaccination Clinic  Name:  [name]    MRN: [id number] DOB: [birth year]  11/29/2019  [name] was observed post Covid-19 immunization for 15 minutes without incident. She was provided with Vaccine Information Sheet and instruction to access the V-Safe system.   [name] was instructed to call 911 with any severe reactions post vaccine: Marland Kitchen Difficulty breathing  . Swelling of face and throat  . A fast heartbeat  . A bad rash all over body  . Dizziness and weakness   Immunizations Administered    Name Date Dose VIS Date Route   Moderna COVID-19 Vaccine 11/29/2019 10:33 AM 0.5 mL 06/2019 Intramuscular   Manufacturer: Moderna   Lot: [id number]   [place][id number]

## 2019-12-30 ENCOUNTER — Other Ambulatory Visit (HOSPITAL_COMMUNITY)
Admission: RE | Admit: 2019-12-30 | Discharge: 2019-12-30 | Disposition: A | Discharge status: Home / Self Care | Payer: Self-pay | Source: Ambulatory Visit | Attending: Internal Medicine | Admitting: Internal Medicine

## 2019-12-30 ENCOUNTER — Ambulatory Visit (INDEPENDENT_AMBULATORY_CARE_PROVIDER_SITE_OTHER): Payer: Self-pay | Admitting: Internal Medicine

## 2019-12-30 ENCOUNTER — Other Ambulatory Visit: Payer: Self-pay

## 2019-12-30 VITALS — BP 127/84 | HR 79 | Temp 99.2°F | Resp 16 | Ht 65.0 in | Wt 207.0 lb

## 2019-12-30 DIAGNOSIS — M479 Spondylosis, unspecified: Secondary | ICD-10-CM

## 2019-12-30 DIAGNOSIS — Z7689 Persons encountering health services in other specified circumstances: Secondary | ICD-10-CM

## 2019-12-30 DIAGNOSIS — Z720 Tobacco use: Secondary | ICD-10-CM

## 2019-12-30 DIAGNOSIS — K219 Gastro-esophageal reflux disease without esophagitis: Secondary | ICD-10-CM

## 2019-12-30 DIAGNOSIS — M47812 Spondylosis without myelopathy or radiculopathy, cervical region: Secondary | ICD-10-CM

## 2019-12-30 DIAGNOSIS — I10 Essential (primary) hypertension: Secondary | ICD-10-CM

## 2019-12-30 DIAGNOSIS — F5101 Primary insomnia: Secondary | ICD-10-CM

## 2019-12-30 LAB — CBC
HCT: 48.3 % — ABNORMAL HIGH (ref 36.0–46.0)
Hemoglobin: 16.6 g/dL — ABNORMAL HIGH (ref 12.0–15.0)
MCH: 34.2 pg — ABNORMAL HIGH (ref 26.0–34.0)
MCHC: 34.4 g/dL (ref 30.0–36.0)
MCV: 99.6 fL (ref 80.0–100.0)
Platelets: 270 10*3/uL (ref 150–400)
RBC: 4.85 MIL/uL (ref 3.87–5.11)
RDW: 12.9 % (ref 11.5–15.5)
WBC: 12.2 10*3/uL — ABNORMAL HIGH (ref 4.0–10.5)
nRBC: 0 % (ref 0.0–0.2)

## 2019-12-30 LAB — LIPID PANEL
Cholesterol: 150 mg/dL (ref 0–200)
HDL: 28 mg/dL — ABNORMAL LOW (ref >40)
LDL Cholesterol: 83 mg/dL (ref 0–99)
Total CHOL/HDL Ratio: 5.4 RATIO
Triglycerides: 193 mg/dL — ABNORMAL HIGH (ref <150)
VLDL: 39 mg/dL (ref 0–40)

## 2019-12-30 LAB — VITAMIN B12: Vitamin B-12: 151 pg/mL — ABNORMAL LOW (ref 180–914)

## 2019-12-30 LAB — MAGNESIUM: Magnesium: 2 mg/dL (ref 1.7–2.4)

## 2019-12-30 LAB — HEMOGLOBIN A1C
Hgb A1c MFr Bld: 5.5 % (ref 4.8–5.6)
Mean Plasma Glucose: 111.15 mg/dL

## 2019-12-30 LAB — URINALYSIS, ROUTINE W REFLEX MICROSCOPIC
Bilirubin Urine: NEGATIVE
Glucose, UA: NEGATIVE mg/dL
Hgb urine dipstick: NEGATIVE
Ketones, ur: NEGATIVE mg/dL
Leukocytes,Ua: NEGATIVE
Nitrite: NEGATIVE
Protein, ur: NEGATIVE mg/dL
Specific Gravity, Urine: 1.003 — ABNORMAL LOW (ref 1.005–1.030)
pH: 6 (ref 5.0–8.0)

## 2019-12-30 LAB — FOLATE: Folate: 63.4 ng/mL (ref >5.9)

## 2019-12-30 NOTE — Patient Instructions (Addendum)
Check CBC, CMP, lipid panel, A1c, UA, folate, B12, magnesium, vit d level today. Follow up in 1 month Quit smoking!!! Discussed about sleep hygiene.

## 2019-12-30 NOTE — Progress Notes (Signed)
New Patient Office Visit  Subjective:  Patient ID: [name], female    DOB: Feb 11, 1976  Age: 44 y.o. MRN: [id number]  CC:  Chief Complaint  Patient presents with  . New Patient (Initial Visit)    establish care    HPI [name] is very pleasant 44 year old female with past medical history of hypertension, hyperlipidemia, GERD, cervical spondylosis, tobacco abuse presents in our clinic for the first time for establishment of care.  Patient tells me that she does not have insurance.  She goes to health department for medical management of her chronic medical issues.  She tells me that she has lightheadedness, dizziness and she fell x2 this past week.  Reports feeling weak and tired.  No history of head trauma, seizure, loss of consciousness.  She has history of cervical spondylosis and received epidural injection 6 months ago.  She takes ibuprofen as needed for chronic neck pain-which she was not able to follow-up again due to insurance issues.  Reports headache, blurry vision, had midsternal chest pain which resolved on its own.  Has chronic exertional shortness of breath and smoker cough however denies wheezing, productive cough, fever, chills, chest congestion, nausea, vomiting, abdominal pain, urinary or bowel changes.  Tells me that she has low mood as she has not been feeling well lately.  No history of suicidal or homicidal thoughts.  Used to be on Ambien, currently she is not on any antidepressant medication.  Does not see any psychiatry outpatient.  Reports insomnia-tried multiple over-the-counter medication with no help.  She smokes 1 pack of cigarettes per day, drinks 2 shots of vodka/whiskey per week, no history of illicit drug use.    She is due for Pap smear, hepatitis C/HIV screening.   Past Medical History:  Diagnosis Date  . Anxiety    Phreesia 12/30/2019  . Depression   . Hypertension     Past Surgical History:  Procedure Laterality Date  .  CHOLECYSTECTOMY    . TUBAL LIGATION      Family History  Problem Relation Age of Onset  . Hypertension Father   . Hypercholesterolemia Father   . COPD Mother   . Emphysema Mother     Social History   Socioeconomic History  . Marital status: Legally Separated    Spouse name: Not on file  . Number of children: Not on file  . Years of education: Not on file  . Highest education level: Not on file  Occupational History  . Not on file  Tobacco Use  . Smoking status: Current Every Day Smoker    Packs/day: 1.50    Years: 22.00    Pack years: 33.00    Types: Cigarettes  . Smokeless tobacco: Never Used  Vaping Use  . Vaping Use: Never used  Substance and Sexual Activity  . Alcohol use: Yes    Comment: twice a month  . Drug use: No  . Sexual activity: Yes    Birth control/protection: Surgical  Other Topics Concern  . Not on file  Social History Narrative  . Not on file   Social Determinants of Health   Financial Resource Strain:   . Difficulty of Paying Living Expenses:   Food Insecurity:   . Worried About Charity fundraiser in the Last Year:   . Arboriculturist in the Last Year:   Transportation Needs:   . Film/video editor (Medical):   Marland Kitchen Lack of Transportation (Non-Medical):   Physical Activity:   .  Days of Exercise per Week:   . Minutes of Exercise per Session:   Stress:   . Feeling of Stress :   Social Connections:   . Frequency of Communication with Friends and Family:   . Frequency of Social Gatherings with Friends and Family:   . Attends Religious Services:   . Active Member of Clubs or Organizations:   . Attends Archivist Meetings:   Marland Kitchen Marital Status:   Intimate Partner Violence:   . Fear of Current or Ex-Partner:   . Emotionally Abused:   Marland Kitchen Physically Abused:   . Sexually Abused:     ROS Review of Systems  Constitutional: Negative.   HENT: Negative.   Eyes: Negative.   Respiratory: Positive for cough.   Cardiovascular:  Negative.   Gastrointestinal: Negative.   Endocrine: Negative.   Genitourinary: Negative.   Musculoskeletal: Positive for neck pain.  Allergic/Immunologic: Negative.   Neurological: Positive for dizziness and light-headedness.  Hematological: Negative.   Psychiatric/Behavioral: Negative.     Objective:   Today's Vitals: BP 127/84   Pulse 79   Temp 99.2 F (37.3 C) (Temporal)   Resp 16   Ht 5\' 5"  (1.651 m)   Wt 207 lb (93.9 kg)   SpO2 97%   BMI 34.45 kg/m   Physical Exam Constitutional:      Appearance: She is obese.  HENT:     Head: Normocephalic and atraumatic.     Nose: Nose normal.     Mouth/Throat:     Mouth: Mucous membranes are moist.  Eyes:     Extraocular Movements: Extraocular movements intact.     Conjunctiva/sclera: Conjunctivae normal.     Pupils: Pupils are equal, round, and reactive to light.  Cardiovascular:     Rate and Rhythm: Normal rate and regular rhythm.     Pulses: Normal pulses.     Heart sounds: Normal heart sounds.  Pulmonary:     Effort: Pulmonary effort is normal.     Breath sounds: Normal breath sounds.  Abdominal:     General: Bowel sounds are normal.     Palpations: Abdomen is soft.  Musculoskeletal:        General: Normal range of motion.     Cervical back: Normal range of motion and neck supple.  Neurological:     General: No focal deficit present.     Mental Status: She is alert and oriented to person, place, and time.  Psychiatric:        Mood and Affect: Mood normal.        Behavior: Behavior normal.        Thought Content: Thought content normal.        Judgment: Judgment normal.     Assessment & Plan:   Problem List Items Addressed This Visit      Cardiovascular and Mediastinum   HTN (hypertension)     Digestive   GERD (gastroesophageal reflux disease)     Musculoskeletal and Integument   Spondylosis     Other   Tobacco abuse    Other Visit Diagnoses    Encounter to establish care    -  Primary    Spondylosis of cervical joint       Morbid obesity ([place])       Primary insomnia         Encounter to establish care: Care established  Recurrent falls: -Unknown etiology?   Reports lightheadedness and dizziness.  Check routine labs such as CBC, CMP, UA,  B12, folate, magnesium, vitamin D level -Follow-up in 1 month.   -PHQ 9 score: 11  Hypertension: Well-controlled -Continue amlodipine and metoprolol. -Check A1c and lipid panel  GERD: Continue PPI  Cervical spondylosis: Had cervical epidural injection on 03/15/2019. -She was not able to follow-up due to insurance issues.  Morbid obesity with BMI of 34 -Discussed with dietary modification, exercise and weight loss  Primary insomnia: -I discussed patient about sleep hygiene and she verbalized understanding. -Follow-up in 1 month-if she continues to have insomnia will start on trazodone. -PHQ-9 score: 11.  She denies suicidal or homicidal thoughts.  Discussed with the patient-we will hold off to start  antidepressant/referral to psychiatry at this time.  Discussed about meditation/yoga, exercise.  Follow-up in 1 month.  If she continues to have depression symptoms will start on antidepressant medication.  Plan discussed with the patient and she verbalized understanding.  Tobacco abuse: Counseled about cessation Asked:confirms currently smokes cigarettes Assess: Unwilling to quit but cutting back Advise: needs to QUIT to reduce risk of cancer, cardio and cerebrovascular disease Assist: counseled for 5 minutes and literature provided Arrange: follow up in 1 month  Outpatient Encounter Medications as of 12/30/2019  Medication Sig  . amLODipine (NORVASC) 10 MG tablet Take 1 tablet (10 mg total) by mouth daily. (Patient taking differently: Take 10 mg by mouth every evening. )  . aspirin EC 81 MG tablet Take 81 mg by mouth once as needed for mild pain or moderate pain.  . calcium carbonate (TUMS - DOSED IN MG ELEMENTAL CALCIUM) 500 MG  chewable tablet Chew 1 tablet by mouth daily as needed for indigestion or heartburn.  Marland Kitchen ibuprofen (ADVIL) 600 MG tablet Take 1 tablet (600 mg total) by mouth every 6 (six) hours as needed for headache or moderate pain.  . metoprolol tartrate (LOPRESSOR) 50 MG tablet Take 1 tablet (50 mg total) by mouth 2 (two) times daily.  . Omega-3 Fatty Acids (FISH OIL) 1000 MG CAPS Take 1,000 mg by mouth 2 (two) times daily.  Marland Kitchen omeprazole (PRILOSEC OTC) 20 MG tablet Take 20 mg by mouth daily.  . [DISCONTINUED] Vitamin D, Ergocalciferol, (DRISDOL) 1.25 MG (50000 UT) CAPS capsule Take 50,000 Units by mouth every Tuesday. (Patient not taking: Reported on 12/30/2019)   No facility-administered encounter medications on file as of 12/30/2019.    Follow-up: No follow-ups on file.   Mckinley Jewel, MD

## 2019-12-31 LAB — VITAMIN D 25 HYDROXY (VIT D DEFICIENCY, FRACTURES): Vit D, 25-Hydroxy: 37.73 ng/mL (ref 30–100)

## 2020-01-01 LAB — URINE CULTURE: Culture: NO GROWTH

## 2020-01-03 ENCOUNTER — Ambulatory Visit: Payer: Self-pay | Admitting: Family Medicine

## 2020-01-04 ENCOUNTER — Other Ambulatory Visit: Payer: Self-pay | Admitting: Internal Medicine

## 2020-01-04 MED ORDER — VITAMIN B-12 1000 MCG PO TABS: 1000.0000 ug | ORAL_TABLET | Freq: Every day | ORAL | 1 refills | Status: DC

## 2020-01-04 MED ORDER — FOLIC ACID 1 MG PO TABS: 1.0000 mg | ORAL_TABLET | Freq: Every day | ORAL | 3 refills | Status: DC

## 2020-01-06 ENCOUNTER — Other Ambulatory Visit: Payer: Self-pay | Admitting: *Deleted

## 2020-01-06 ENCOUNTER — Telehealth: Payer: Self-pay

## 2020-01-06 MED ORDER — FOLIC ACID 1 MG PO TABS: 1.0000 mg | ORAL_TABLET | Freq: Every day | ORAL | 3 refills | Status: DC

## 2020-01-06 MED ORDER — VITAMIN B-12 1000 MCG PO TABS: 1000.0000 ug | ORAL_TABLET | Freq: Every day | ORAL | 1 refills | Status: AC

## 2020-01-06 MED ORDER — VITAMIN B-12 1000 MCG PO TABS: 1000.0000 ug | ORAL_TABLET | Freq: Every day | ORAL | 1 refills | Status: DC

## 2020-01-06 NOTE — Telephone Encounter (Signed)
Refills sent in and let pt know under Jarrett Soho since Doristine Bosworth is out of the office

## 2020-01-06 NOTE — Telephone Encounter (Signed)
Pt did not rec the medication --[facility] said they didn't get anything

## 2020-01-27 ENCOUNTER — Ambulatory Visit: Payer: Self-pay | Admitting: Internal Medicine

## 2020-04-11 ENCOUNTER — Ambulatory Visit: Payer: Self-pay | Admitting: Internal Medicine

## 2020-04-12 ENCOUNTER — Encounter: Payer: Self-pay | Admitting: Internal Medicine

## 2020-04-12 ENCOUNTER — Other Ambulatory Visit: Payer: Self-pay

## 2020-04-12 ENCOUNTER — Ambulatory Visit (INDEPENDENT_AMBULATORY_CARE_PROVIDER_SITE_OTHER): Payer: Self-pay | Admitting: Internal Medicine

## 2020-04-12 VITALS — BP 118/84 | HR 65 | Temp 98.4°F | Resp 18 | Ht 63.0 in | Wt 199.0 lb

## 2020-04-12 DIAGNOSIS — K219 Gastro-esophageal reflux disease without esophagitis: Secondary | ICD-10-CM

## 2020-04-12 DIAGNOSIS — E559 Vitamin D deficiency, unspecified: Secondary | ICD-10-CM

## 2020-04-12 DIAGNOSIS — Z720 Tobacco use: Secondary | ICD-10-CM

## 2020-04-12 DIAGNOSIS — E538 Deficiency of other specified B group vitamins: Secondary | ICD-10-CM | POA: Insufficient documentation

## 2020-04-12 DIAGNOSIS — I10 Essential (primary) hypertension: Secondary | ICD-10-CM

## 2020-04-12 DIAGNOSIS — R42 Dizziness and giddiness: Secondary | ICD-10-CM | POA: Insufficient documentation

## 2020-04-12 MED ORDER — AMLODIPINE BESYLATE 10 MG PO TABS: 10.0000 mg | ORAL_TABLET | Freq: Every day | ORAL | 1 refills | Status: DC

## 2020-04-12 MED ORDER — METOPROLOL TARTRATE 50 MG PO TABS: 50.0000 mg | ORAL_TABLET | Freq: Two times a day (BID) | ORAL | 1 refills | Status: DC

## 2020-04-12 NOTE — Assessment & Plan Note (Signed)
On Omeprazole 

## 2020-04-12 NOTE — Assessment & Plan Note (Addendum)
Well-controlled with Amlodipine and Metoprolol, refill provided Advised to check BP at home Advised DASH diet and moderate exercise as tolerated F/u CMP in next visit

## 2020-04-12 NOTE — Assessment & Plan Note (Signed)
Asked about quitting: confirms that she currently smokes cigarettes Advise to quit smoking: Educated about QUITTING to reduce the risk of cancer, cardio and cerebrovascular disease. Assess willingness: Unwilling to quit at this time, but is working on cutting back. Assist with counseling and pharmacotherapy: Counseled for 5 minutes and literature provided. Arrange for follow up: follow up in 4 months and continue to offer help.

## 2020-04-12 NOTE — Assessment & Plan Note (Signed)
On oral Vit B12 supplements

## 2020-04-12 NOTE — Assessment & Plan Note (Signed)
Advised to take Vit D 1000 U daily Will check Vit D after 6 months

## 2020-04-12 NOTE — Progress Notes (Signed)
Established Patient Office Visit  Subjective:  Patient ID: [name], female    DOB: 20-May-1976  Age: 44 y.o. MRN: [id number]  CC:  Chief Complaint  Patient presents with  . Follow-up  . Hypertension    HPI [name] is a 44 year old female with PMH of HTN, HLD, GERD and tobacco abuse presents for follow up of her chronic conditions. Patient states that she has not had any falls since last visit. But she still c/o dizziness, more pronounced with standing from sitting position. She also c/o room spinning sensation at times while in sitting position. She denies nausea or vomiting. She states that she has been feeling well since she started Vit B12 supplements.  She is trying to cut back on smoking and currently smokes about 1 pack/day, reduced from 2 packs/day.  She takes medications regularly and tolerates them well.  Past Medical History:  Diagnosis Date  . Anxiety    Phreesia 12/30/2019  . Depression   . Hypertension     Past Surgical History:  Procedure Laterality Date  . CHOLECYSTECTOMY    . TUBAL LIGATION      Family History  Problem Relation Age of Onset  . Hypertension Father   . Hypercholesterolemia Father   . COPD Mother   . Emphysema Mother     Social History   Socioeconomic History  . Marital status: Legally Separated    Spouse name: Not on file  . Number of children: Not on file  . Years of education: Not on file  . Highest education level: Not on file  Occupational History  . Not on file  Tobacco Use  . Smoking status: Current Every Day Smoker    Packs/day: 1.50    Years: 22.00    Pack years: 33.00    Types: Cigarettes  . Smokeless tobacco: Never Used  Vaping Use  . Vaping Use: Never used  Substance and Sexual Activity  . Alcohol use: Yes    Comment: twice a month  . Drug use: No  . Sexual activity: Yes    Birth control/protection: Surgical  Other Topics Concern  . Not on file  Social History Narrative  . Not on file    Social Determinants of Health   Financial Resource Strain:   . Difficulty of Paying Living Expenses: Not on file  Food Insecurity:   . Worried About Charity fundraiser in the Last Year: Not on file  . Ran Out of Food in the Last Year: Not on file  Transportation Needs:   . Lack of Transportation (Medical): Not on file  . Lack of Transportation (Non-Medical): Not on file  Physical Activity:   . Days of Exercise per Week: Not on file  . Minutes of Exercise per Session: Not on file  Stress:   . Feeling of Stress : Not on file  Social Connections:   . Frequency of Communication with Friends and Family: Not on file  . Frequency of Social Gatherings with Friends and Family: Not on file  . Attends Religious Services: Not on file  . Active Member of Clubs or Organizations: Not on file  . Attends Archivist Meetings: Not on file  . Marital Status: Not on file  Intimate Partner Violence:   . Fear of Current or Ex-Partner: Not on file  . Emotionally Abused: Not on file  . Physically Abused: Not on file  . Sexually Abused: Not on file    Outpatient Medications  Prior to Visit  Medication Sig Dispense Refill  . aspirin EC 81 MG tablet Take 81 mg by mouth once as needed for mild pain or moderate pain.    . calcium carbonate (TUMS - DOSED IN MG ELEMENTAL CALCIUM) 500 MG chewable tablet Chew 1 tablet by mouth daily as needed for indigestion or heartburn.    . folic acid (FOLVITE) 1 MG tablet Take 1 tablet (1 mg total) by mouth daily. 60 tablet 3  . ibuprofen (ADVIL) 600 MG tablet Take 1 tablet (600 mg total) by mouth every 6 (six) hours as needed for headache or moderate pain. 15 tablet 0  . Omega-3 Fatty Acids (FISH OIL) 1000 MG CAPS Take 1,000 mg by mouth 2 (two) times daily.    Marland Kitchen omeprazole (PRILOSEC OTC) 20 MG tablet Take 20 mg by mouth daily.    . vitamin B-12 (CYANOCOBALAMIN) 1000 MCG tablet Take 1 tablet (1,000 mcg total) by mouth daily. 60 tablet 1  . amLODipine (NORVASC)  10 MG tablet Take 1 tablet (10 mg total) by mouth daily. (Patient taking differently: Take 10 mg by mouth every evening. ) 30 tablet 1  . metoprolol tartrate (LOPRESSOR) 50 MG tablet Take 1 tablet (50 mg total) by mouth 2 (two) times daily. 60 tablet 1   No facility-administered medications prior to visit.    Allergies  Allergen Reactions  . Other     "laughing gas"= skin feels like it burning    ROS Review of Systems  Constitutional: Negative for chills and fever.  HENT: Negative for congestion, sinus pressure, sinus pain and sore throat.   Eyes: Negative for pain and discharge.  Respiratory: Negative for cough and shortness of breath.   Cardiovascular: Negative for chest pain and palpitations.  Gastrointestinal: Negative for abdominal pain, constipation, diarrhea, nausea and vomiting.  Endocrine: Negative for polydipsia and polyuria.  Genitourinary: Negative for dysuria and hematuria.  Musculoskeletal: Negative for neck pain and neck stiffness.  Skin: Negative for rash.  Neurological: Positive for dizziness. Negative for seizures, syncope, weakness and numbness.  Psychiatric/Behavioral: Negative for agitation and behavioral problems.      Objective:    Physical Exam Vitals reviewed.  Constitutional:      General: She is not in acute distress.    Appearance: She is obese. She is not diaphoretic.  HENT:     Head: Normocephalic and atraumatic.     Nose: Nose normal. No congestion.     Mouth/Throat:     Mouth: Mucous membranes are moist.     Pharynx: No posterior oropharyngeal erythema.  Eyes:     General: No scleral icterus.    Extraocular Movements: Extraocular movements intact.     Pupils: Pupils are equal, round, and reactive to light.  Cardiovascular:     Rate and Rhythm: Normal rate and regular rhythm.     Heart sounds: No murmur heard.   Pulmonary:     Breath sounds: Normal breath sounds. No wheezing or rales.  Abdominal:     Palpations: Abdomen is soft.      Tenderness: There is no abdominal tenderness.  Musculoskeletal:     Cervical back: Neck supple. No tenderness.     Right lower leg: No edema.     Left lower leg: No edema.  Skin:    General: Skin is warm.     Findings: No rash.  Neurological:     General: No focal deficit present.     Mental Status: She is alert and oriented to  person, place, and time.     Sensory: No sensory deficit.     Motor: No weakness.     Gait: Gait normal.  Psychiatric:        Mood and Affect: Mood normal.        Behavior: Behavior normal.     BP 118/84 (BP Location: Right Arm)   Pulse 65   Temp 98.4 F (36.9 C)   Resp 18   Ht 5\' 3"  (1.6 m)   Wt 199 lb (90.3 kg)   SpO2 98%   BMI 35.25 kg/m  Wt Readings from Last 3 Encounters:  04/12/20 199 lb (90.3 kg)  12/30/19 207 lb (93.9 kg)  07/21/19 180 lb (81.6 kg)     Health Maintenance Due  Topic Date Due  . Hepatitis C Screening  Never done  . HIV Screening  Never done  . TETANUS/TDAP  Never done  . PAP SMEAR-Modifier  Never done  . INFLUENZA VACCINE  Never done    There are no preventive care reminders to display for this patient.  No results found for: TSH Lab Results  Component Value Date   WBC 12.2 (H) 12/30/2019   HGB 16.6 (H) 12/30/2019   HCT 48.3 (H) 12/30/2019   MCV 99.6 12/30/2019   PLT 270 12/30/2019   Lab Results  Component Value Date   NA 136 02/28/2019   K 3.7 02/28/2019   CO2 24 02/28/2019   GLUCOSE 87 02/28/2019   BUN 10 02/28/2019   CREATININE 0.86 02/28/2019   BILITOT 0.4 02/28/2019   ALKPHOS 91 02/28/2019   AST 17 02/28/2019   ALT 18 02/28/2019   PROT 7.5 02/28/2019   ALBUMIN 4.0 02/28/2019   CALCIUM 8.6 (L) 02/28/2019   ANIONGAP 10 02/28/2019   Lab Results  Component Value Date   CHOL 150 12/30/2019   Lab Results  Component Value Date   HDL 28 (L) 12/30/2019   Lab Results  Component Value Date   LDLCALC 83 12/30/2019   Lab Results  Component Value Date   TRIG 193 (H) 12/30/2019   Lab Results   Component Value Date   CHOLHDL 5.4 12/30/2019   Lab Results  Component Value Date   HGBA1C 5.5 12/30/2019      Assessment & Plan:   HTN (hypertension) Well-controlled with Amlodipine and Metoprolol, refill provided Advised to check BP at home Advised DASH diet and moderate exercise as tolerated F/u CMP in next visit  GERD (gastroesophageal reflux disease) On Omeprazole  Dizziness Had episodes of falls before previous visit, no report of fall since Likely due to sudden positional change vs Vit B12 deficiency vs vertigo Continue Vit B12 supplements Advised to avoid sudden positional change, advised to check BP at home Advised Meclizine PRN and vestibular exercises for vertigo, material provided  Tobacco abuse Asked about quitting: confirms that she currently smokes cigarettes Advise to quit smoking: Educated about QUITTING to reduce the risk of cancer, cardio and cerebrovascular disease. Assess willingness: Unwilling to quit at this time, but is working on cutting back. Assist with counseling and pharmacotherapy: Counseled for 5 minutes and literature provided. Arrange for follow up: follow up in 4 months and continue to offer help.   Vitamin B12 deficiency On oral Vit B12 supplements  Vitamin D deficiency Advised to take Vit D 1000 U daily Will check Vit D after 6 months   Meds ordered this encounter  Medications  . amLODipine (NORVASC) 10 MG tablet    Sig: Take 1 tablet (  10 mg total) by mouth daily.    Dispense:  30 tablet    Refill:  1  . metoprolol tartrate (LOPRESSOR) 50 MG tablet    Sig: Take 1 tablet (50 mg total) by mouth 2 (two) times daily.    Dispense:  60 tablet    Refill:  1    Follow-up: Return in about 4 months (around 08/12/2020).    Lindell Spar, MD

## 2020-04-12 NOTE — Patient Instructions (Addendum)
Please check BP at home and bring the log in the next visit.  Please avoid sudden changes in position. Sit by the end of the bed for at least 2 mins before you start walking.  For vertigo, please try Meclizine up to 3 times in a day. Perform simple vestibular exercises as discussed.  [email or web address]  Please follow DASH diet and perform moderate exercise as tolerated.  DASH stands for Dietary Approaches to Stop Hypertension. The DASH diet is a healthy-eating plan designed to help treat or prevent high blood pressure (hypertension).  The DASH diet includes foods that are rich in potassium, calcium and magnesium. These nutrients help control blood pressure. The diet limits foods that are high in sodium, saturated fat and added sugars.  Studies have shown that the DASH diet can lower blood pressure in as little as two weeks. The diet can also lower low-density lipoprotein (LDL or "bad") cholesterol levels in the blood. High blood pressure and high LDL cholesterol levels are two major risk factors for heart disease and stroke.    DASH diet: Recommended servings The DASH diet provides daily and weekly nutritional goals. The number of servings you should have depends on your daily calorie needs.  Here's a look at the recommended servings from each food group for a 2,000-calorie-a-day DASH diet:  Grains: 6 to 8 servings a day. One serving is one slice bread, 1 ounce dry cereal, or 1/2 cup cooked cereal, rice or pasta. Vegetables: 4 to 5 servings a day. One serving is 1 cup raw leafy green vegetable, 1/2 cup cut-up raw or cooked vegetables, or 1/2 cup vegetable juice. Fruits: 4 to 5 servings a day. One serving is one medium fruit, 1/2 cup fresh, frozen or canned fruit, or 1/2 cup fruit juice. Fat-free or low-fat dairy products: 2 to 3 servings a day. One serving is 1 cup milk or yogurt, or 1 1/2 ounces cheese. Lean meats, poultry and fish: six 1-ounce servings or fewer a  day. One serving is 1 ounce cooked meat, poultry or fish, or 1 egg. Nuts, seeds and legumes: 4 to 5 servings a week. One serving is 1/3 cup nuts, 2 tablespoons peanut butter, 2 tablespoons seeds, or 1/2 cup cooked legumes (dried beans or peas). Fats and oils: 2 to 3 servings a day. One serving is 1 teaspoon soft margarine, 1 teaspoon vegetable oil, 1 tablespoon mayonnaise or 2 tablespoons salad dressing. Sweets and added sugars: 5 servings or fewer a week. One serving is 1 tablespoon sugar, jelly or jam, 1/2 cup sorbet, or 1 cup lemonade.

## 2020-04-12 NOTE — Assessment & Plan Note (Signed)
Had episodes of falls before previous visit, no report of fall since Likely due to sudden positional change vs Vit B12 deficiency vs vertigo Continue Vit B12 supplements Advised to avoid sudden positional change, advised to check BP at home Advised Meclizine PRN and vestibular exercises for vertigo, material provided

## 2020-06-17 ENCOUNTER — Other Ambulatory Visit: Payer: Self-pay | Admitting: Internal Medicine

## 2020-06-17 DIAGNOSIS — I10 Essential (primary) hypertension: Secondary | ICD-10-CM

## 2020-08-13 ENCOUNTER — Ambulatory Visit: Payer: Self-pay | Admitting: Internal Medicine

## 2020-08-14 ENCOUNTER — Other Ambulatory Visit: Payer: Self-pay

## 2020-08-14 MED ORDER — FOLIC ACID 1 MG PO TABS: 1.0000 mg | ORAL_TABLET | Freq: Every day | ORAL | 3 refills | Status: AC

## 2020-09-26 ENCOUNTER — Other Ambulatory Visit: Payer: Self-pay | Admitting: *Deleted

## 2020-09-26 DIAGNOSIS — I10 Essential (primary) hypertension: Secondary | ICD-10-CM

## 2020-09-26 MED ORDER — AMLODIPINE BESYLATE 10 MG PO TABS: 10.0000 mg | ORAL_TABLET | Freq: Every day | ORAL | 3 refills | Status: DC

## 2020-10-17 ENCOUNTER — Other Ambulatory Visit (HOSPITAL_COMMUNITY): Payer: Self-pay | Admitting: *Deleted

## 2020-10-17 DIAGNOSIS — Z1231 Encounter for screening mammogram for malignant neoplasm of breast: Secondary | ICD-10-CM

## 2020-10-26 ENCOUNTER — Other Ambulatory Visit: Payer: Self-pay

## 2020-10-26 ENCOUNTER — Ambulatory Visit (HOSPITAL_COMMUNITY)
Admission: RE | Admit: 2020-10-26 | Discharge: 2020-10-26 | Disposition: A | Discharge status: Home / Self Care | Payer: Self-pay | Source: Ambulatory Visit | Attending: *Deleted | Admitting: *Deleted

## 2020-10-26 DIAGNOSIS — Z1231 Encounter for screening mammogram for malignant neoplasm of breast: Secondary | ICD-10-CM | POA: Insufficient documentation

## 2020-10-30 ENCOUNTER — Other Ambulatory Visit (HOSPITAL_COMMUNITY): Payer: Self-pay | Admitting: *Deleted

## 2020-10-30 DIAGNOSIS — R928 Other abnormal and inconclusive findings on diagnostic imaging of breast: Secondary | ICD-10-CM

## 2020-11-20 ENCOUNTER — Ambulatory Visit (HOSPITAL_COMMUNITY)
Admission: RE | Admit: 2020-11-20 | Discharge: 2020-11-20 | Disposition: A | Discharge status: Home / Self Care | Payer: Self-pay | Source: Ambulatory Visit | Attending: *Deleted | Admitting: *Deleted

## 2020-11-20 ENCOUNTER — Other Ambulatory Visit: Payer: Self-pay

## 2020-11-20 ENCOUNTER — Other Ambulatory Visit (HOSPITAL_COMMUNITY)
Admission: RE | Admit: 2020-11-20 | Discharge: 2020-11-20 | Disposition: A | Discharge status: Home / Self Care | Payer: Self-pay | Source: Ambulatory Visit | Attending: Nurse Practitioner | Admitting: Nurse Practitioner

## 2020-11-20 DIAGNOSIS — R87610 Atypical squamous cells of undetermined significance on cytologic smear of cervix (ASC-US): Secondary | ICD-10-CM | POA: Insufficient documentation

## 2020-11-20 DIAGNOSIS — R928 Other abnormal and inconclusive findings on diagnostic imaging of breast: Secondary | ICD-10-CM

## 2020-11-27 LAB — SURGICAL PATHOLOGY

## 2021-06-25 ENCOUNTER — Other Ambulatory Visit (HOSPITAL_COMMUNITY): Payer: Self-pay | Admitting: *Deleted

## 2021-06-25 DIAGNOSIS — R928 Other abnormal and inconclusive findings on diagnostic imaging of breast: Secondary | ICD-10-CM

## 2021-07-23 ENCOUNTER — Ambulatory Visit (HOSPITAL_COMMUNITY)
Admission: RE | Admit: 2021-07-23 | Discharge: 2021-07-23 | Disposition: A | Discharge status: Home / Self Care | Payer: Self-pay | Source: Ambulatory Visit | Attending: *Deleted | Admitting: *Deleted

## 2021-07-23 ENCOUNTER — Other Ambulatory Visit: Payer: Self-pay

## 2021-07-23 ENCOUNTER — Encounter (HOSPITAL_COMMUNITY): Payer: Self-pay

## 2021-07-23 DIAGNOSIS — R928 Other abnormal and inconclusive findings on diagnostic imaging of breast: Secondary | ICD-10-CM

## 2021-11-01 ENCOUNTER — Other Ambulatory Visit: Payer: Self-pay

## 2021-11-01 ENCOUNTER — Encounter (HOSPITAL_COMMUNITY): Payer: Self-pay

## 2021-11-01 ENCOUNTER — Emergency Department (HOSPITAL_COMMUNITY): Payer: Self-pay

## 2021-11-01 ENCOUNTER — Emergency Department (HOSPITAL_COMMUNITY)
Admission: EM | Admit: 2021-11-01 | Discharge: 2021-11-02 | Disposition: A | Discharge status: Home / Self Care | Payer: Self-pay | Attending: Emergency Medicine | Admitting: Emergency Medicine

## 2021-11-01 DIAGNOSIS — H81399 Other peripheral vertigo, unspecified ear: Secondary | ICD-10-CM | POA: Insufficient documentation

## 2021-11-01 DIAGNOSIS — Z79899 Other long term (current) drug therapy: Secondary | ICD-10-CM | POA: Insufficient documentation

## 2021-11-01 DIAGNOSIS — I10 Essential (primary) hypertension: Secondary | ICD-10-CM | POA: Insufficient documentation

## 2021-11-01 DIAGNOSIS — Z7982 Long term (current) use of aspirin: Secondary | ICD-10-CM | POA: Insufficient documentation

## 2021-11-01 LAB — BASIC METABOLIC PANEL
Anion gap: 5 (ref 5–15)
BUN: 13 mg/dL (ref 6–20)
CO2: 22 mmol/L (ref 22–32)
Calcium: 8.7 mg/dL — ABNORMAL LOW (ref 8.9–10.3)
Chloride: 110 mmol/L (ref 98–111)
Creatinine, Ser: 1.08 mg/dL — ABNORMAL HIGH (ref 0.44–1.00)
GFR, Estimated: >60 mL/min (ref >60)
Glucose, Bld: 123 mg/dL — ABNORMAL HIGH (ref 70–99)
Potassium: 4.1 mmol/L (ref 3.5–5.1)
Sodium: 137 mmol/L (ref 135–145)

## 2021-11-01 LAB — URINALYSIS, ROUTINE W REFLEX MICROSCOPIC
Bacteria, UA: NONE SEEN
Bilirubin Urine: NEGATIVE
Glucose, UA: NEGATIVE mg/dL
Ketones, ur: NEGATIVE mg/dL
Leukocytes,Ua: NEGATIVE
Nitrite: NEGATIVE
Protein, ur: NEGATIVE mg/dL
Specific Gravity, Urine: 1.01 (ref 1.005–1.030)
pH: 6 (ref 5.0–8.0)

## 2021-11-01 LAB — CBC WITH DIFFERENTIAL/PLATELET
Abs Immature Granulocytes: 0.03 10*3/uL (ref 0.00–0.07)
Basophils Absolute: 0.1 10*3/uL (ref 0.0–0.1)
Basophils Relative: 1 %
Eosinophils Absolute: 0.2 10*3/uL (ref 0.0–0.5)
Eosinophils Relative: 2 %
HCT: 44.1 % (ref 36.0–46.0)
Hemoglobin: 15.1 g/dL — ABNORMAL HIGH (ref 12.0–15.0)
Immature Granulocytes: 0 %
Lymphocytes Relative: 32 %
Lymphs Abs: 3.3 10*3/uL (ref 0.7–4.0)
MCH: 33.6 pg (ref 26.0–34.0)
MCHC: 34.2 g/dL (ref 30.0–36.0)
MCV: 98 fL (ref 80.0–100.0)
Monocytes Absolute: 0.6 10*3/uL (ref 0.1–1.0)
Monocytes Relative: 6 %
Neutro Abs: 6.1 10*3/uL (ref 1.7–7.7)
Neutrophils Relative %: 59 %
Platelets: 201 10*3/uL (ref 150–400)
RBC: 4.5 MIL/uL (ref 3.87–5.11)
RDW: 13.8 % (ref 11.5–15.5)
WBC: 10.4 10*3/uL (ref 4.0–10.5)
nRBC: 0 % (ref 0.0–0.2)

## 2021-11-01 LAB — HEPATIC FUNCTION PANEL
ALT: 18 U/L (ref 0–44)
AST: 22 U/L (ref 15–41)
Albumin: 3.9 g/dL (ref 3.5–5.0)
Alkaline Phosphatase: 85 U/L (ref 38–126)
Bilirubin, Direct: 0.1 mg/dL (ref 0.0–0.2)
Indirect Bilirubin: 0.3 mg/dL (ref 0.3–0.9)
Total Bilirubin: 0.4 mg/dL (ref 0.3–1.2)
Total Protein: 7.3 g/dL (ref 6.5–8.1)

## 2021-11-01 MED ORDER — MECLIZINE HCL 12.5 MG PO TABS
25.0000 mg | ORAL_TABLET | Freq: Once | ORAL | Status: AC
  Administered 2021-11-01: 25 mg via ORAL
  Filled 2021-11-01: qty 2

## 2021-11-01 NOTE — ED Notes (Signed)
Patient transported to CT 

## 2021-11-01 NOTE — ED Provider Notes (Signed)
?[facility] ?Provider Note ? ? ?CSN: [id number] ?Arrival date & time: 11/01/21  1956 ? ?  ? ?History ? ?Chief Complaint  ?Patient presents with  ? Dizziness  ? ? ?[name] is a 46 y.o. female. ? ?The history is provided by the patient.  ?Dizziness ?She has history of hypertension and comes in with 2-week history of "feeling off".  She states that she has been very tired and just wants to sleep.  She is also noted dizziness which she describes of is a spinning sensation.  This seems to be worse when she bends over and then starts to pick something up.  She has been afraid to pick up her grandchild because of this.  She also has episodes where she feels like she does not know where she is and was afraid to drive.  There has been associated nausea but no vomiting.  She has also had a rather global headache.  She did see her primary care provider today who did orthostatic vital signs which did not show any significant change in pulse or blood pressure.  Some labs were ordered, results pending.  She does have history of vertigo in the past, but has not taken anything for the vertigo. ?  ?Home Medications ?Prior to Admission medications   ?Medication Sig Start Date End Date Taking? Authorizing Provider  ?amLODipine (NORVASC) 10 MG tablet Take 1 tablet (10 mg total) by mouth daily. 09/26/20   Lindell Spar, MD  ?aspirin EC 81 MG tablet Take 81 mg by mouth once as needed for mild pain or moderate pain.    [provider]  ?calcium carbonate (TUMS - DOSED IN MG ELEMENTAL CALCIUM) 500 MG chewable tablet Chew 1 tablet by mouth daily as needed for indigestion or heartburn.    [provider]  ?ibuprofen (ADVIL) 600 MG tablet Take 1 tablet (600 mg total) by mouth every 6 (six) hours as needed for headache or moderate pain. 02/28/19   [name], PA-C  ?metoprolol tartrate (LOPRESSOR) 50 MG tablet Take 1 tablet by mouth twice daily 06/18/20   Lindell Spar, MD  ?Omega-3 Fatty  Acids (FISH OIL) 1000 MG CAPS Take 1,000 mg by mouth 2 (two) times daily.    [provider]  ?omeprazole (PRILOSEC OTC) 20 MG tablet Take 20 mg by mouth daily.    [provider]  ?vitamin B-12 (CYANOCOBALAMIN) 1000 MCG tablet Take 1 tablet (1,000 mcg total) by mouth daily. 01/06/20   Perlie Mayo, NP  ?   ? ?Allergies    ?Other   ? ?Review of Systems   ?Review of Systems  ?Neurological:  Positive for dizziness.  ?All other systems reviewed and are negative. ? ?Physical Exam ?Updated Vital Signs ?BP 124/80   Pulse 63   Temp 97.9 ?F (36.6 ?C) (Oral)   Resp 17   Ht '5\' 2"'$  (1.575 m)   Wt 94.4 kg   LMP 10/11/2021 (Approximate)   SpO2 95%   BMI 38.08 kg/m?  ?Physical Exam ?Vitals and nursing note reviewed.  ?46 year old female, resting comfortably and in no acute distress. Vital signs are normal. Oxygen saturation is 95%, which is normal. ?Head is normocephalic and atraumatic. PERRLA, EOMI. Oropharynx is clear. ?Neck is nontender and supple without adenopathy or JVD. ?Back is nontender and there is no CVA tenderness. ?Lungs are clear without rales, wheezes, or rhonchi. ?Chest is nontender. ?Heart has regular rate and rhythm without murmur. ?Abdomen is soft, flat,  nontender. ?Extremities have no cyanosis or edema, full range of motion is present. ?Skin is warm and dry without rash. ?Neurologic: Mental status is normal, cranial nerves are intact, strength is 5/5 in all 4 extremities.  Finger-nose testing is normal.  Dizziness is reproduced by passive head movement and downward gaze and leftward gaze. ? ?ED Results / Procedures / Treatments   ?Labs ?(all labs ordered are listed, but only abnormal results are displayed) ?Labs Reviewed  ?CBC WITH DIFFERENTIAL/PLATELET - Abnormal; Notable for the following components:  ?    Result Value  ? Hemoglobin 15.1 (*)   ? All other components within normal limits  ?BASIC METABOLIC PANEL - Abnormal; Notable for the following components:  ? Glucose, Bld 123  (*)   ? Creatinine, Ser 1.08 (*)   ? Calcium 8.7 (*)   ? All other components within normal limits  ?URINALYSIS, ROUTINE W REFLEX MICROSCOPIC - Abnormal; Notable for the following components:  ? Hgb urine dipstick SMALL (*)   ? All other components within normal limits  ?HEPATIC FUNCTION PANEL  ? ?Radiology ?CT Head Wo Contrast ? ?Result Date: 11/01/2021 ?CLINICAL DATA:  Dizziness, non-specific EXAM: CT HEAD WITHOUT CONTRAST TECHNIQUE: Contiguous axial images were obtained from the base of the skull through the vertex without intravenous contrast. RADIATION DOSE REDUCTION: This exam was performed according to the departmental dose-optimization program which includes automated exposure control, adjustment of the mA and/or kV according to patient size and/or use of iterative reconstruction technique. COMPARISON:  None. FINDINGS: Brain: No evidence of large-territorial acute infarction. No parenchymal hemorrhage. No mass lesion. No extra-axial collection. No mass effect or midline shift. No hydrocephalus. Basilar cisterns are patent. Vascular: No hyperdense vessel. Skull: No acute fracture or focal lesion. Sinuses/Orbits: Paranasal sinuses and mastoid air cells are clear. The orbits are unremarkable. Other: None. IMPRESSION: No acute intracranial abnormality. Electronically Signed   By: Iven Finn M.D.   On: 11/01/2021 23:53   ? ?Procedures ?Procedures  ? ? ?Medications Ordered in ED ?Medications  ?meclizine (ANTIVERT) tablet 25 mg (has no administration in time range)  ? ? ?ED Course/ Medical Decision Making/ A&P ?  ?                        ?Medical Decision Making ?Amount and/or Complexity of Data Reviewed ?Labs: ordered. ?Radiology: ordered. ? ? ?Dizziness which seems most likely to be peripheral vertigo.  However, given patient's vague symptoms and headache, I feel that CT of the head would be appropriate and this is ordered.  CBC and metabolic panel are unremarkable.  We will check hepatic function studies as  well as urinalysis to rule out occult urinary tract infection.  She does not show incoordination that would suggest central vertigo.  Old records are reviewed, and she has no relevant past visits. ? ?Urinalysis shows no evidence of infection, hepatic function panel is normal.  CT of head shows no acute process.  I independently viewed the images, and agree with the radiologist's interpretation.  Patient had marked improvement following meclizine, this appears to be peripheral vertigo and no further work-up is indicated.  She is discharged with a prescription for meclizine. ? ?Final Clinical Impression(s) / ED Diagnoses ?Final diagnoses:  ?Peripheral vertigo, unspecified laterality  ? ? ?Rx / DC Orders ?ED Discharge Orders   ? ?      Ordered  ?  meclizine (ANTIVERT) 25 MG tablet  3 times daily PRN       ?  11/02/21 0048  ? ?  ?  ? ?  ? ? ?  ?Delora Fuel, MD ?71/06/26 [phone] ? ?

## 2021-11-01 NOTE — ED Triage Notes (Signed)
Pt arrived via POV c/o dizziness. Pt reports being seen by PCP earlier today and had Ortho Static V/S measured. V/S measured earlier present unremarkable. Pt reports possibly being dehydrated or having undiagnosed vertigo.  ?

## 2021-11-02 LAB — LAB REPORT - SCANNED: EGFR: 67

## 2021-11-02 MED ORDER — MECLIZINE HCL 25 MG PO TABS: 25.0000 mg | ORAL_TABLET | Freq: Three times a day (TID) | ORAL | 0 refills | Status: DC | PRN

## 2021-11-02 NOTE — Discharge Instructions (Signed)
Return if you are having any problems. 

## 2021-11-02 NOTE — ED Notes (Signed)
Pt ambulated to the bathroom and back to room unassisted. Pt stated during the walk "I feel better than I did" ?

## 2022-02-11 ENCOUNTER — Emergency Department (HOSPITAL_COMMUNITY): Payer: Self-pay

## 2022-02-11 ENCOUNTER — Emergency Department (HOSPITAL_COMMUNITY)
Admission: EM | Admit: 2022-02-11 | Discharge: 2022-02-11 | Disposition: A | Discharge status: Home / Self Care | Payer: Self-pay | Attending: Emergency Medicine | Admitting: Emergency Medicine

## 2022-02-11 ENCOUNTER — Encounter (HOSPITAL_COMMUNITY): Payer: Self-pay | Admitting: Emergency Medicine

## 2022-02-11 ENCOUNTER — Other Ambulatory Visit: Payer: Self-pay

## 2022-02-11 DIAGNOSIS — D72829 Elevated white blood cell count, unspecified: Secondary | ICD-10-CM | POA: Insufficient documentation

## 2022-02-11 DIAGNOSIS — Z79899 Other long term (current) drug therapy: Secondary | ICD-10-CM | POA: Insufficient documentation

## 2022-02-11 DIAGNOSIS — Z9049 Acquired absence of other specified parts of digestive tract: Secondary | ICD-10-CM | POA: Insufficient documentation

## 2022-02-11 DIAGNOSIS — R109 Unspecified abdominal pain: Secondary | ICD-10-CM

## 2022-02-11 DIAGNOSIS — K529 Noninfective gastroenteritis and colitis, unspecified: Secondary | ICD-10-CM | POA: Insufficient documentation

## 2022-02-11 DIAGNOSIS — Z7982 Long term (current) use of aspirin: Secondary | ICD-10-CM | POA: Insufficient documentation

## 2022-02-11 LAB — URINALYSIS, ROUTINE W REFLEX MICROSCOPIC
Bilirubin Urine: NEGATIVE
Glucose, UA: NEGATIVE mg/dL
Ketones, ur: NEGATIVE mg/dL
Leukocytes,Ua: NEGATIVE
Nitrite: NEGATIVE
Protein, ur: NEGATIVE mg/dL
Specific Gravity, Urine: 1.009 (ref 1.005–1.030)
pH: 6 (ref 5.0–8.0)

## 2022-02-11 LAB — COMPREHENSIVE METABOLIC PANEL
ALT: 12 U/L (ref 0–44)
AST: 12 U/L — ABNORMAL LOW (ref 15–41)
Albumin: 4.1 g/dL (ref 3.5–5.0)
Alkaline Phosphatase: 81 U/L (ref 38–126)
Anion gap: 8 (ref 5–15)
BUN: 9 mg/dL (ref 6–20)
CO2: 20 mmol/L — ABNORMAL LOW (ref 22–32)
Calcium: 9.1 mg/dL (ref 8.9–10.3)
Chloride: 110 mmol/L (ref 98–111)
Creatinine, Ser: 1 mg/dL (ref 0.44–1.00)
GFR, Estimated: >60 mL/min (ref >60)
Glucose, Bld: 115 mg/dL — ABNORMAL HIGH (ref 70–99)
Potassium: 3.9 mmol/L (ref 3.5–5.1)
Sodium: 138 mmol/L (ref 135–145)
Total Bilirubin: 0.7 mg/dL (ref 0.3–1.2)
Total Protein: 7.9 g/dL (ref 6.5–8.1)

## 2022-02-11 LAB — CBC
HCT: 42 % (ref 36.0–46.0)
Hemoglobin: 14.7 g/dL (ref 12.0–15.0)
MCH: 33.2 pg (ref 26.0–34.0)
MCHC: 35 g/dL (ref 30.0–36.0)
MCV: 94.8 fL (ref 80.0–100.0)
Platelets: 266 10*3/uL (ref 150–400)
RBC: 4.43 MIL/uL (ref 3.87–5.11)
RDW: 12.9 % (ref 11.5–15.5)
WBC: 13.8 10*3/uL — ABNORMAL HIGH (ref 4.0–10.5)
nRBC: 0 % (ref 0.0–0.2)

## 2022-02-11 LAB — PREGNANCY, URINE: Preg Test, Ur: NEGATIVE

## 2022-02-11 LAB — LIPASE, BLOOD: Lipase: 38 U/L (ref 11–51)

## 2022-02-11 MED ORDER — CIPROFLOXACIN HCL 500 MG PO TABS: 500.0000 mg | ORAL_TABLET | Freq: Two times a day (BID) | ORAL | 0 refills | Status: DC

## 2022-02-11 MED ORDER — ONDANSETRON HCL 4 MG/2ML IJ SOLN
4.0000 mg | Freq: Once | INTRAMUSCULAR | Status: AC
  Administered 2022-02-11: 4 mg via INTRAVENOUS
  Filled 2022-02-11: qty 2

## 2022-02-11 MED ORDER — METRONIDAZOLE 500 MG PO TABS: 500.0000 mg | ORAL_TABLET | Freq: Three times a day (TID) | ORAL | 0 refills | Status: DC

## 2022-02-11 MED ORDER — DICYCLOMINE HCL 20 MG PO TABS: 20.0000 mg | ORAL_TABLET | Freq: Two times a day (BID) | ORAL | 0 refills | Status: DC

## 2022-02-11 MED ORDER — KETOROLAC TROMETHAMINE 15 MG/ML IJ SOLN
15.0000 mg | Freq: Once | INTRAMUSCULAR | Status: AC
  Administered 2022-02-11: 15 mg via INTRAVENOUS
  Filled 2022-02-11: qty 1

## 2022-02-11 NOTE — Discharge Instructions (Addendum)
Please read and follow all provided instructions.  Your diagnoses today include:  1. Colitis   2. Right sided abdominal pain     Tests performed today include: Blood cell counts and platelets: white blood cell count was high Kidney and liver function tests: normal Pancreas function test (called lipase): normal Urine test to look for infection A blood or urine test for pregnancy (women only) Vital signs. See below for your results today.   Medications prescribed:  Ciprofloxacin - antibiotic  You have been prescribed an antibiotic medicine: take the entire course of medicine even if you are feeling better. Stopping early can cause the antibiotic not to work.  Metronidazole - antibiotic  You have been prescribed an antibiotic medicine: take the entire course of medicine even if you are feeling better. Stopping early can cause the antibiotic not to work. Do not drink alcohol when taking this medication.   Bentyl - medication for intestinal cramps and spasms  Take any prescribed medications only as directed.  Home care instructions:  Follow any educational materials contained in this packet.  Follow-up instructions: Please follow-up with your primary care provider or the gastroenterology referral in the next 3-5 days for further evaluation of your symptoms.    Return instructions:  SEEK IMMEDIATE MEDICAL ATTENTION IF: The pain does not go away or becomes severe  A temperature above 101F develops  Repeated vomiting occurs (multiple episodes)  The pain becomes localized to portions of the abdomen. The right side could possibly be appendicitis. In an adult, the left lower portion of the abdomen could be colitis or diverticulitis.  Blood is being passed in stools or vomit (bright red or black tarry stools)  You develop chest pain, difficulty breathing, dizziness or fainting, or become confused, poorly responsive, or inconsolable (young children) If you have any other emergent  concerns regarding your health  Additional Information: Abdominal (belly) pain can be caused by many things. Your caregiver performed an examination and possibly ordered blood/urine tests and imaging (CT scan, x-rays, ultrasound). Many cases can be observed and treated at home after initial evaluation in the emergency department. Even though you are being discharged home, abdominal pain can be unpredictable. Therefore, you need a repeated exam if your pain does not resolve, returns, or worsens. Most patients with abdominal pain don't have to be admitted to the hospital or have surgery, but serious problems like appendicitis and gallbladder attacks can start out as nonspecific pain. Many abdominal conditions cannot be diagnosed in one visit, so follow-up evaluations are very important.  Your vital signs today were: BP 132/87 (BP Location: Right Arm)   Pulse 85   Temp 97.8 F (36.6 C) (Oral)   Resp 18   Ht '5\' 4"'$  (1.626 m)   Wt 86.2 kg   LMP 01/16/2022 (Approximate)   SpO2 99%   BMI 32.61 kg/m  If your blood pressure (bp) was elevated above 135/85 this visit, please have this repeated by your doctor within one month. --------------

## 2022-02-11 NOTE — ED Provider Notes (Signed)
[facility] EMERGENCY DEPARTMENT Provider Note   CSN: [id number] Arrival date & time: 02/11/22  1314     History  Chief Complaint  Patient presents with   Abdominal Pain    [name] is a 46 y.o. female.  Patient with history of cholecystectomy presents to the emergency department today for evaluation of colicky right-sided abdominal pain and flank pain starting 2 days ago.  Initially pain was mild.  Today it was much more severe.  Pain radiates to the right groin.  She has had nausea without vomiting.  She does report episodes of diarrhea, nonbloody.  No dysuria, hematuria, increased frequency or urgency.  She has a history of ovarian cyst.  She cannot find a comfortable position when the pain hits her.  It is intermittent at times.      Home Medications Prior to Admission medications   Medication Sig Start Date End Date Taking? Authorizing Provider  amLODipine (NORVASC) 10 MG tablet Take 1 tablet (10 mg total) by mouth daily. 09/26/20   Lindell Spar, MD  aspirin EC 81 MG tablet Take 81 mg by mouth once as needed for mild pain or moderate pain.    [provider]  calcium carbonate (TUMS - DOSED IN MG ELEMENTAL CALCIUM) 500 MG chewable tablet Chew 1 tablet by mouth daily as needed for indigestion or heartburn.    [provider]  ibuprofen (ADVIL) 600 MG tablet Take 1 tablet (600 mg total) by mouth every 6 (six) hours as needed for headache or moderate pain. 02/28/19   [name], PA-C  meclizine (ANTIVERT) 25 MG tablet Take 1 tablet (25 mg total) by mouth 3 (three) times daily as needed for dizziness. 4/70/96   Delora Fuel, MD  metoprolol tartrate (LOPRESSOR) 50 MG tablet Take 1 tablet by mouth twice daily 06/18/20   Lindell Spar, MD  Omega-3 Fatty Acids (FISH OIL) 1000 MG CAPS Take 1,000 mg by mouth 2 (two) times daily.    [provider]  omeprazole (PRILOSEC OTC) 20 MG tablet Take 20 mg by mouth daily.    [provider]  vitamin  B-12 (CYANOCOBALAMIN) 1000 MCG tablet Take 1 tablet (1,000 mcg total) by mouth daily. 01/06/20   Perlie Mayo, NP      Allergies    Other    Review of Systems   Review of Systems  Physical Exam Updated Vital Signs BP 132/87 (BP Location: Right Arm)   Pulse 85   Temp 97.8 F (36.6 C) (Oral)   Resp 18   Ht '5\' 4"'$  (1.626 m)   Wt 86.2 kg   LMP 01/16/2022 (Approximate)   SpO2 99%   BMI 32.61 kg/m  Physical Exam Vitals and nursing note reviewed.  Constitutional:      General: She is in acute distress.     Appearance: She is well-developed.     Comments: Patient standing and pacing in room, appears uncomfortable  HENT:     Head: Normocephalic and atraumatic.     Right Ear: External ear normal.     Left Ear: External ear normal.     Nose: Nose normal.  Eyes:     Conjunctiva/sclera: Conjunctivae normal.  Cardiovascular:     Rate and Rhythm: Normal rate and regular rhythm.     Heart sounds: No murmur heard. Pulmonary:     Effort: No respiratory distress.     Breath sounds: No wheezing, rhonchi or rales.  Abdominal:     Palpations: Abdomen is  soft.     Tenderness: There is abdominal tenderness in the right upper quadrant and right lower quadrant. There is no guarding or rebound.  Musculoskeletal:     Cervical back: Normal range of motion and neck supple.     Right lower leg: No edema.     Left lower leg: No edema.  Skin:    General: Skin is warm and dry.     Findings: No rash.  Neurological:     General: No focal deficit present.     Mental Status: She is alert. Mental status is at baseline.     Motor: No weakness.  Psychiatric:        Mood and Affect: Mood normal.    ED Results / Procedures / Treatments   Labs (all labs ordered are listed, but only abnormal results are displayed) Labs Reviewed  COMPREHENSIVE METABOLIC PANEL - Abnormal; Notable for the following components:      Result Value   CO2 20 (*)    Glucose, Bld 115 (*)    AST 12 (*)    All other  components within normal limits  CBC - Abnormal; Notable for the following components:   WBC 13.8 (*)    All other components within normal limits  LIPASE, BLOOD  PREGNANCY, URINE  URINALYSIS, ROUTINE W REFLEX MICROSCOPIC    EKG None  Radiology CT Renal Stone Study  Result Date: 02/11/2022 CLINICAL DATA:  2 days of worsening right flank. EXAM: CT ABDOMEN AND PELVIS WITHOUT CONTRAST TECHNIQUE: Multidetector CT imaging of the abdomen and pelvis was performed following the standard protocol without IV contrast. RADIATION DOSE REDUCTION: This exam was performed according to the departmental dose-optimization program which includes automated exposure control, adjustment of the mA and/or kV according to patient size and/or use of iterative reconstruction technique. COMPARISON:  CT October 03, 2004. FINDINGS: Lower chest: No acute abnormality. Hepatobiliary: Unremarkable noncontrast enhanced appearance of the liver. Gallbladder surgically absent. No biliary ductal dilation. Pancreas: No pancreatic ductal dilation or evidence of acute inflammation Spleen: No splenomegaly. Adrenals/Urinary Tract: Bilateral adrenal glands appear normal. No hydronephrosis. No renal, ureteral or bladder calculi. Stomach/Bowel: No radiopaque enteric contrast material was administered. Stomach is unremarkable for degree of distension. No pathologic dilation of small or large bowel. The appendix and terminal ileum appear normal. Circumferential wall thickening with adjacent inflammatory stranding involving the ascending colon. Vascular/Lymphatic: Aortic atherosclerosis. No pathologically enlarged abdominal or pelvic lymph nodes. Reproductive: Uterus and bilateral adnexa are unremarkable in noncontrast CT appearance. Other: No pneumoperitoneum.  No walled off fluid collections. Musculoskeletal: No acute osseous abnormality. Multilevel degenerative changes spine. Degenerative change of the bilateral hips. IMPRESSION: 1. Circumferential  wall thickening with adjacent inflammatory stranding involving the ascending colon most consistent with an infectious or inflammatory colitis. 2. No evidence of perforation or pericolonic abscess. 3.  Aortic Atherosclerosis (ICD10-I70.0). Electronically Signed   By: Dahlia Bailiff M.D.   On: 02/11/2022 15:37    Procedures Procedures    Medications Ordered in ED Medications  ketorolac (TORADOL) 15 MG/ML injection 15 mg (15 mg Intravenous Given 02/11/22 1419)  ondansetron (ZOFRAN) injection 4 mg (4 mg Intravenous Given 02/11/22 1418)    ED Course/ Medical Decision Making/ A&P    Patient seen and examined. History obtained directly from patient.   Labs/EKG: Ordered CBC, CMP, lipase, UA, urine pregnancy.  Imaging: Ordered CT renal protocol to evaluate for right-sided ureteral colic.  Medications/Fluids: Ordered: IV Toradol, IV Zofran, fluid bolus.   Most recent vital signs reviewed  and are as follows: BP 132/87 (BP Location: Right Arm)   Pulse 85   Temp 97.8 F (36.6 C) (Oral)   Resp 18   Ht '5\' 4"'$  (1.626 m)   Wt 86.2 kg   LMP 01/16/2022 (Approximate)   SpO2 99%   BMI 32.61 kg/m   Initial impression: Right-sided abdominal pain, possible ureteral colic.  5:39 PM Reassessment performed. Patient appears more comfortable.  States that the Toradol helped but she still has some pain.  Labs personally reviewed and interpreted including: CBC with elevated white blood cell count at 13.8 otherwise unremarkable; CMP with normal liver function testing, unremarkable; lipase normal.  Pregnancy negative.  Imaging personally visualized and interpreted including: CT of the abdomen pelvis, agree a sending colitis, normal appendix, no ureteral stones.  Reviewed pertinent lab work and imaging with patient at bedside. Questions answered.   Most current vital signs reviewed and are as follows: BP 132/87 (BP Location: Right Arm)   Pulse 85   Temp 97.8 F (36.6 C) (Oral)   Resp 18   Ht '5\' 4"'$   (1.626 m)   Wt 86.2 kg   LMP 01/16/2022 (Approximate)   SpO2 99%   BMI 32.61 kg/m   Plan: Discharge to home.   Prescriptions written for: Cipro, Flagyl, Bentyl.  Patient would like to take ibuprofen and Tylenol at home for pain.  She states that she has medication at home for nausea.  Other home care instructions discussed: [place]  ED return instructions discussed: The patient was urged to return to the Emergency Department immediately with worsening of current symptoms, worsening abdominal pain, persistent vomiting, blood noted in stools, fever, or any other concerns. The patient verbalized understanding.   Follow-up instructions discussed: Patient encouraged to follow-up with their PCP in 3-5 days.  Referral information given for GI.                          Medical Decision Making Amount and/or Complexity of Data Reviewed Labs: ordered. Radiology: ordered.  Risk Prescription drug management.   For this patient's complaint of abdominal pain, the following conditions were considered on the differential diagnosis: gastritis/PUD, enteritis/duodenitis, appendicitis, cholelithiasis/cholecystitis, cholangitis, pancreatitis, ruptured viscus, colitis, diverticulitis, small/large bowel obstruction, proctitis, cystitis, pyelonephritis, ureteral colic, aortic dissection, aortic aneurysm. In women, ectopic pregnancy, pelvic inflammatory disease, ovarian cysts, and tubo-ovarian abscess were also considered. Atypical chest etiologies were also considered including ACS, PE, and pneumonia.   The patient's vital signs, pertinent lab work and imaging were reviewed and interpreted as discussed in the ED course. Hospitalization was considered for further testing, treatments, or serial exams/observation. However as patient is well-appearing, has a stable exam, and reassuring studies today, I do not feel that they warrant admission at this time. This plan was discussed with the patient who verbalizes  agreement and comfort with this plan and seems reliable and able to return to the Emergency Department with worsening or changing symptoms.           Final Clinical Impression(s) / ED Diagnoses Final diagnoses:  Colitis  Right sided abdominal pain    Rx / DC Orders ED Discharge Orders          Ordered    ciprofloxacin (CIPRO) 500 MG tablet  2 times daily        02/11/22 1556    metroNIDAZOLE (FLAGYL) 500 MG tablet  3 times daily        02/11/22 1556    dicyclomine (  BENTYL) 20 MG tablet  2 times daily        02/11/22 1556              Carlisle Cater, PA-C 02/11/22 1600    Godfrey Pick, MD 02/11/22 1806

## 2022-02-11 NOTE — ED Triage Notes (Signed)
Pt presents with RUQ abdominal pain with nausea and diarrhea since Sunday, has had gallbladder removed.

## 2022-05-05 ENCOUNTER — Encounter (INDEPENDENT_AMBULATORY_CARE_PROVIDER_SITE_OTHER): Payer: Self-pay | Admitting: Gastroenterology

## 2022-05-05 ENCOUNTER — Telehealth (INDEPENDENT_AMBULATORY_CARE_PROVIDER_SITE_OTHER): Payer: Self-pay

## 2022-05-05 ENCOUNTER — Encounter (INDEPENDENT_AMBULATORY_CARE_PROVIDER_SITE_OTHER): Payer: Self-pay

## 2022-05-05 ENCOUNTER — Ambulatory Visit (INDEPENDENT_AMBULATORY_CARE_PROVIDER_SITE_OTHER): Payer: Self-pay | Admitting: Gastroenterology

## 2022-05-05 ENCOUNTER — Other Ambulatory Visit (INDEPENDENT_AMBULATORY_CARE_PROVIDER_SITE_OTHER): Payer: Self-pay

## 2022-05-05 VITALS — BP 136/90 | HR 97 | Temp 98.1°F | Ht 64.0 in | Wt 195.9 lb

## 2022-05-05 DIAGNOSIS — R1030 Lower abdominal pain, unspecified: Secondary | ICD-10-CM

## 2022-05-05 DIAGNOSIS — R197 Diarrhea, unspecified: Secondary | ICD-10-CM

## 2022-05-05 DIAGNOSIS — K529 Noninfective gastroenteritis and colitis, unspecified: Secondary | ICD-10-CM

## 2022-05-05 DIAGNOSIS — Z01812 Encounter for preprocedural laboratory examination: Secondary | ICD-10-CM

## 2022-05-05 DIAGNOSIS — R634 Abnormal weight loss: Secondary | ICD-10-CM | POA: Insufficient documentation

## 2022-05-05 DIAGNOSIS — R1031 Right lower quadrant pain: Secondary | ICD-10-CM | POA: Insufficient documentation

## 2022-05-05 MED ORDER — PEG 3350-KCL-NA BICARB-NACL 420 G PO SOLR: 4000.0000 mL | ORAL | 0 refills | Status: DC

## 2022-05-05 MED ORDER — HYOSCYAMINE SULFATE 0.125 MG SL SUBL: 0.1250 mg | SUBLINGUAL_TABLET | Freq: Four times a day (QID) | SUBLINGUAL | 1 refills | Status: DC | PRN

## 2022-05-05 NOTE — H&P (View-Only) (Signed)
Referring Provider: Health, [place] Primary Care Physician:  Health, [place] Primary GI Physician: new  Chief Complaint  Patient presents with   colitis    Hospital follow up on colitis. Has to have BM that is like watery everytime she eats. Feeling better. Having pain on left side that comes and goes.    HPI:   [name] is a 46 y.o. female with past medical history of anxiety, depression, HTN  Patient presents today as a new patient for hospital follow up of Colitis.  ED visit on 4/0/98 with colicky right sided abdominal and flank pain x2 days that had become more severe from onset. Also with nausea and diarrhea. Denied urinary symptoms.  Lipase 38, CMP WNL, WBC 13.8  CT renal study with Circumferential wall thickening with adjacent inflammatory stranding involving the ascending colon most consistent with an infectious or inflammatory colitis.No evidence of perforation or pericolonic abscess.  Patient discharged with cipro '500mg'$  BID, Flagyl '500mg'$  TID, dicyclomine '20mg'$  BID  Present:  Patient states prior to ED visit she started having R sided abd pain that she thought was gas. Took gas x and did not have improvement. Pain worsened by Tuesday prompting her to go to the ED. She denies fevers or chills. states that she took all antibiotics as above. She notes that she is still having watery BMs, 4-5x per day at least. She has BMs shortly after eating. Continued abdominal pain that is generalized. She notes that she is worried about fecal incontinence as sometimes she does not have a warning about when she is going to have a BM. Does not think there has been any BRB or melena. Denies nausea or vomiting. Appetite is not great. States she can go all day without eating. Initially certain foods would cause worsening symptoms so she is hesitant about what to eat. PCP did send a bland diet for her to try. Is not taking anything for diarrhea. Notably she was 208  lbs in April and is 195 ibs today. She denies any previous GI issues. States that dicyclomine she was given in the ER did not provide any relief. Denies recent sick contacts or travel. No other new medications or antibiotic therapy prior to cirpro and flagyl.   NSAID use: ibuprofen only as needed  Social hx: smokes 2 PPD, previously drinking mostly liquor on the weekends, 2-3 pint sized bottles. Has stopped alcohol since acute illness  Fam hx: does not think there was any presence of CRC, no IBD that she is aware of, no pancreatic cancer, mother had issues with her liver  Last Colonoscopy: many years ago Last Endoscopy:many years ago, thinks she had this done with colonoscopy  Recommendations:    Past Medical History:  Diagnosis Date   Anxiety    Phreesia 12/30/2019   Depression    Hypertension     Past Surgical History:  Procedure Laterality Date   CHOLECYSTECTOMY     TUBAL LIGATION      Current Outpatient Medications  Medication Sig Dispense Refill   amLODipine (NORVASC) 10 MG tablet Take 1 tablet (10 mg total) by mouth daily. 30 tablet 3   aspirin EC 81 MG tablet Take 81 mg by mouth once as needed for mild pain or moderate pain.     meclizine (ANTIVERT) 25 MG tablet Take 1 tablet (25 mg total) by mouth 3 (three) times daily as needed for dizziness. 30 tablet 0   metoprolol tartrate (LOPRESSOR) 50 MG tablet Take 1  tablet by mouth twice daily 60 tablet 2   Omega-3 Fatty Acids (FISH OIL PO) Take by mouth. 2,000 mg bid     pantoprazole (PROTONIX) 40 MG tablet Take 40 mg by mouth daily. One in the morning     vitamin B-12 (CYANOCOBALAMIN) 1000 MCG tablet Take 1 tablet (1,000 mcg total) by mouth daily. 60 tablet 1   No current facility-administered medications for this visit.    Allergies as of 05/05/2022 - Review Complete 05/05/2022  Allergen Reaction Noted   Other  09/13/2012    Family History  Problem Relation Age of Onset   Hypertension Father    Hypercholesterolemia  Father    COPD Mother    Emphysema Mother     Social History   Socioeconomic History   Marital status: Legally Separated    Spouse name: Not on file   Number of children: Not on file   Years of education: Not on file   Highest education level: Not on file  Occupational History   Not on file  Tobacco Use   Smoking status: Every Day    Packs/day: 1.50    Years: 22.00    Total pack years: 33.00    Types: Cigarettes    Passive exposure: Current   Smokeless tobacco: Never  Vaping Use   Vaping Use: Never used  Substance and Sexual Activity   Alcohol use: Not Currently   Drug use: No   Sexual activity: Yes    Birth control/protection: Surgical  Other Topics Concern   Not on file  Social History Narrative   Not on file   Social Determinants of Health   Financial Resource Strain: Not on file  Food Insecurity: Not on file  Transportation Needs: Not on file  Physical Activity: Not on file  Stress: Not on file  Social Connections: Not on file   Review of systems General: negative for malaise, night sweats, fever, chills, +weight loss Neck: Negative for lumps, goiter, pain and significant neck swelling Resp: Negative for cough, wheezing, dyspnea at rest CV: Negative for chest pain, leg swelling, palpitations, orthopnea GI: denies melena, hematochezia, nausea, vomiting, constipation, dysphagia, odyonophagia, early satiety +weight loss +diarrhea +abdominal pain   MSK: Negative for joint pain or swelling, back pain, and muscle pain. Derm: Negative for itching or rash Psych: Denies depression, anxiety, memory loss, confusion. No homicidal or suicidal ideation.  Heme: Negative for prolonged bleeding, bruising easily, and swollen nodes. Endocrine: Negative for cold or heat intolerance, polyuria, polydipsia and goiter. Neuro: negative for tremor, gait imbalance, syncope and seizures. The remainder of the review of systems is noncontributory.  Physical Exam: BP (!) 136/90 (BP  Location: Left Arm, Patient Position: Sitting, Cuff Size: Large)   Pulse 97   Temp 98.1 F (36.7 C) (Oral)   Ht '5\' 4"'$  (1.626 m)   Wt 195 lb 14.4 oz (88.9 kg)   BMI 33.63 kg/m  General:   Alert and oriented. No distress noted. Pleasant and cooperative.  Head:  Normocephalic and atraumatic. Eyes:  Conjuctiva clear without scleral icterus. Mouth:  Oral mucosa pink and moist. Good dentition. No lesions. Heart: Normal rate and rhythm, s1 and s2 heart sounds present.  Lungs: Clear lung sounds in all lobes. Respirations equal and unlabored. Abdomen:  +BS, soft, and non-distended. Mild TTP of diffuse lower abdomen. No rebound or guarding. No HSM or masses noted. Derm: No palmar erythema or jaundice Msk:  Symmetrical without gross deformities. Normal posture. Extremities:  Without edema. Neurologic:  Alert  and  oriented x4 Psych:  Alert and cooperative. Normal mood and affect.  Invalid input(s): "6 MONTHS"   ASSESSMENT: LUNAH LOSASSO is a 46 y.o. female presenting today as a new patient for recent colitis with continued diarrhea.  Sudden onset of abdominal pain and diarrhea in august, seen in ED with CT concerning for colitis. She was treated with cipro and flagyl, as well as dicyclomine for symptomatic control. She completed abx and took dicyclomine without much improvement. Continues to have 4-5 watery BMs per day and generalized abdominal pain with fecal urgency. Denies rectal bleeding or melena. Prior to acute illness onset, having 1-2 solid BMs per day. No familial hx of IBD that she is aware of. She is not taking anything for her symptoms currently. Notably 208 pounds in April and 195 lbs today. Will check stool studies to rule out underlying ongoing infectious etiology, however, given time frame this is less likely. Encouraged to start otc probiotic. Will send Levsin 0.'125mg'$  q6h for abdominal discomfort. Recommend scheduling colonoscopy for further evaluation and to rule out presence of  IBD/malignancy. If stool studies are negative, can use otc Imodium to help slow diarrhea. Indications, risks and benefits of procedure discussed in detail with patient. Patient verbalized understanding and is in agreement to proceed with Colonoscopy at this time.    PLAN:  C diff, GI pathogen panel, O&P 2.  Rx Levsin 0.125 mg q6h PRN 3.  Start probiotic  4. Schedule colonoscopy ASA II ENDO 1 5. Imodium once stool studies back, if negative  All questions were answered, patient verbalized understanding and is in agreement with plan as outlined above.   Follow Up: 3 months  Cornel Werber L. Alver Sorrow, MSN, APRN, AGNP-C Adult-Gerontology Nurse Practitioner [facility]  I have reviewed the note and agree with the APP's assessment as described in this progress note  Maylon Peppers, MD Gastroenterology and Hepatology [facility] Gastroenterology

## 2022-05-05 NOTE — Telephone Encounter (Signed)
Kian Gamarra Ann Faiz Weber, CMA  ?

## 2022-05-05 NOTE — Progress Notes (Addendum)
Referring Provider: Health, [place] Primary Care Physician:  Health, [place] Primary GI Physician: new  Chief Complaint  Patient presents with   colitis    Hospital follow up on colitis. Has to have BM that is like watery everytime she eats. Feeling better. Having pain on left side that comes and goes.    HPI:   [name] is a 46 y.o. female with past medical history of anxiety, depression, HTN  Patient presents today as a new patient for hospital follow up of Colitis.  ED visit on 10/19/07 with colicky right sided abdominal and flank pain x2 days that had become more severe from onset. Also with nausea and diarrhea. Denied urinary symptoms.  Lipase 38, CMP WNL, WBC 13.8  CT renal study with Circumferential wall thickening with adjacent inflammatory stranding involving the ascending colon most consistent with an infectious or inflammatory colitis.No evidence of perforation or pericolonic abscess.  Patient discharged with cipro '500mg'$  BID, Flagyl '500mg'$  TID, dicyclomine '20mg'$  BID  Present:  Patient states prior to ED visit she started having R sided abd pain that she thought was gas. Took gas x and did not have improvement. Pain worsened by Tuesday prompting her to go to the ED. She denies fevers or chills. states that she took all antibiotics as above. She notes that she is still having watery BMs, 4-5x per day at least. She has BMs shortly after eating. Continued abdominal pain that is generalized. She notes that she is worried about fecal incontinence as sometimes she does not have a warning about when she is going to have a BM. Does not think there has been any BRB or melena. Denies nausea or vomiting. Appetite is not great. States she can go all day without eating. Initially certain foods would cause worsening symptoms so she is hesitant about what to eat. PCP did send a bland diet for her to try. Is not taking anything for diarrhea. Notably she was 208  lbs in April and is 195 ibs today. She denies any previous GI issues. States that dicyclomine she was given in the ER did not provide any relief. Denies recent sick contacts or travel. No other new medications or antibiotic therapy prior to cirpro and flagyl.   NSAID use: ibuprofen only as needed  Social hx: smokes 2 PPD, previously drinking mostly liquor on the weekends, 2-3 pint sized bottles. Has stopped alcohol since acute illness  Fam hx: does not think there was any presence of CRC, no IBD that she is aware of, no pancreatic cancer, mother had issues with her liver  Last Colonoscopy: many years ago Last Endoscopy:many years ago, thinks she had this done with colonoscopy  Recommendations:    Past Medical History:  Diagnosis Date   Anxiety    Phreesia 12/30/2019   Depression    Hypertension     Past Surgical History:  Procedure Laterality Date   CHOLECYSTECTOMY     TUBAL LIGATION      Current Outpatient Medications  Medication Sig Dispense Refill   amLODipine (NORVASC) 10 MG tablet Take 1 tablet (10 mg total) by mouth daily. 30 tablet 3   aspirin EC 81 MG tablet Take 81 mg by mouth once as needed for mild pain or moderate pain.     meclizine (ANTIVERT) 25 MG tablet Take 1 tablet (25 mg total) by mouth 3 (three) times daily as needed for dizziness. 30 tablet 0   metoprolol tartrate (LOPRESSOR) 50 MG tablet Take 1  tablet by mouth twice daily 60 tablet 2   Omega-3 Fatty Acids (FISH OIL PO) Take by mouth. 2,000 mg bid     pantoprazole (PROTONIX) 40 MG tablet Take 40 mg by mouth daily. One in the morning     vitamin B-12 (CYANOCOBALAMIN) 1000 MCG tablet Take 1 tablet (1,000 mcg total) by mouth daily. 60 tablet 1   No current facility-administered medications for this visit.    Allergies as of 05/05/2022 - Review Complete 05/05/2022  Allergen Reaction Noted   Other  09/13/2012    Family History  Problem Relation Age of Onset   Hypertension Father    Hypercholesterolemia  Father    COPD Mother    Emphysema Mother     Social History   Socioeconomic History   Marital status: Legally Separated    Spouse name: Not on file   Number of children: Not on file   Years of education: Not on file   Highest education level: Not on file  Occupational History   Not on file  Tobacco Use   Smoking status: Every Day    Packs/day: 1.50    Years: 22.00    Total pack years: 33.00    Types: Cigarettes    Passive exposure: Current   Smokeless tobacco: Never  Vaping Use   Vaping Use: Never used  Substance and Sexual Activity   Alcohol use: Not Currently   Drug use: No   Sexual activity: Yes    Birth control/protection: Surgical  Other Topics Concern   Not on file  Social History Narrative   Not on file   Social Determinants of Health   Financial Resource Strain: Not on file  Food Insecurity: Not on file  Transportation Needs: Not on file  Physical Activity: Not on file  Stress: Not on file  Social Connections: Not on file   Review of systems General: negative for malaise, night sweats, fever, chills, +weight loss Neck: Negative for lumps, goiter, pain and significant neck swelling Resp: Negative for cough, wheezing, dyspnea at rest CV: Negative for chest pain, leg swelling, palpitations, orthopnea GI: denies melena, hematochezia, nausea, vomiting, constipation, dysphagia, odyonophagia, early satiety +weight loss +diarrhea +abdominal pain   MSK: Negative for joint pain or swelling, back pain, and muscle pain. Derm: Negative for itching or rash Psych: Denies depression, anxiety, memory loss, confusion. No homicidal or suicidal ideation.  Heme: Negative for prolonged bleeding, bruising easily, and swollen nodes. Endocrine: Negative for cold or heat intolerance, polyuria, polydipsia and goiter. Neuro: negative for tremor, gait imbalance, syncope and seizures. The remainder of the review of systems is noncontributory.  Physical Exam: BP (!) 136/90 (BP  Location: Left Arm, Patient Position: Sitting, Cuff Size: Large)   Pulse 97   Temp 98.1 F (36.7 C) (Oral)   Ht '5\' 4"'$  (1.626 m)   Wt 195 lb 14.4 oz (88.9 kg)   BMI 33.63 kg/m  General:   Alert and oriented. No distress noted. Pleasant and cooperative.  Head:  Normocephalic and atraumatic. Eyes:  Conjuctiva clear without scleral icterus. Mouth:  Oral mucosa pink and moist. Good dentition. No lesions. Heart: Normal rate and rhythm, s1 and s2 heart sounds present.  Lungs: Clear lung sounds in all lobes. Respirations equal and unlabored. Abdomen:  +BS, soft, and non-distended. Mild TTP of diffuse lower abdomen. No rebound or guarding. No HSM or masses noted. Derm: No palmar erythema or jaundice Msk:  Symmetrical without gross deformities. Normal posture. Extremities:  Without edema. Neurologic:  Alert  and  oriented x4 Psych:  Alert and cooperative. Normal mood and affect.  Invalid input(s): "6 MONTHS"   ASSESSMENT: CARLETHA DAWN is a 46 y.o. female presenting today as a new patient for recent colitis with continued diarrhea.  Sudden onset of abdominal pain and diarrhea in august, seen in ED with CT concerning for colitis. She was treated with cipro and flagyl, as well as dicyclomine for symptomatic control. She completed abx and took dicyclomine without much improvement. Continues to have 4-5 watery BMs per day and generalized abdominal pain with fecal urgency. Denies rectal bleeding or melena. Prior to acute illness onset, having 1-2 solid BMs per day. No familial hx of IBD that she is aware of. She is not taking anything for her symptoms currently. Notably 208 pounds in April and 195 lbs today. Will check stool studies to rule out underlying ongoing infectious etiology, however, given time frame this is less likely. Encouraged to start otc probiotic. Will send Levsin 0.'125mg'$  q6h for abdominal discomfort. Recommend scheduling colonoscopy for further evaluation and to rule out presence of  IBD/malignancy. If stool studies are negative, can use otc Imodium to help slow diarrhea. Indications, risks and benefits of procedure discussed in detail with patient. Patient verbalized understanding and is in agreement to proceed with Colonoscopy at this time.    PLAN:  C diff, GI pathogen panel, O&P 2.  Rx Levsin 0.125 mg q6h PRN 3.  Start probiotic  4. Schedule colonoscopy ASA II ENDO 1 5. Imodium once stool studies back, if negative  All questions were answered, patient verbalized understanding and is in agreement with plan as outlined above.   Follow Up: 3 months  Ariel Wingrove L. Alver Sorrow, MSN, APRN, AGNP-C Adult-Gerontology Nurse Practitioner [facility]  I have reviewed the note and agree with the APP's assessment as described in this progress note  Maylon Peppers, MD Gastroenterology and Hepatology [facility] Gastroenterology

## 2022-05-05 NOTE — Telephone Encounter (Signed)
Mosie Angus Ann Brelee Renk, CMA  ?

## 2022-05-05 NOTE — Patient Instructions (Signed)
It was nice to meet you! We will get you scheduled for colonoscopy for further evaluation of your symptoms We will also check stool studies to rule out any further infection I have sent levsin for you to take every 6 hours as needed for abdominal pain and diarrhea If stool studies are negative, you can start using over the counter imodium for diarrhea Please start an over the counter probiotic, aim for atleast 2-3 strains of bacteria in the one you choose  Follow up 3 months

## 2022-05-12 LAB — GI PROFILE, STOOL, PCR

## 2022-05-16 LAB — OVA AND PARASITE EXAMINATION

## 2022-05-19 ENCOUNTER — Other Ambulatory Visit: Payer: Self-pay

## 2022-05-19 ENCOUNTER — Encounter (HOSPITAL_COMMUNITY): Payer: Self-pay

## 2022-05-19 ENCOUNTER — Encounter (HOSPITAL_COMMUNITY)
Admission: RE | Admit: 2022-05-19 | Discharge: 2022-05-19 | Disposition: A | Discharge status: Home / Self Care | Payer: Self-pay | Source: Ambulatory Visit | Attending: Gastroenterology | Admitting: Gastroenterology

## 2022-05-19 VITALS — Ht 64.0 in | Wt 195.9 lb

## 2022-05-19 DIAGNOSIS — Z01818 Encounter for other preprocedural examination: Secondary | ICD-10-CM

## 2022-05-19 HISTORY — DX: Gastro-esophageal reflux disease without esophagitis: K21.9

## 2022-05-19 NOTE — Pre-Procedure Instructions (Signed)
Attempted pre-op phone call but recording states the number cannot be completed. Messaged Mindy at [facility] to see if they have another number listed.

## 2022-05-20 ENCOUNTER — Other Ambulatory Visit (HOSPITAL_COMMUNITY)
Admission: RE | Admit: 2022-05-20 | Discharge: 2022-05-20 | Disposition: A | Discharge status: Home / Self Care | Payer: Self-pay | Source: Ambulatory Visit | Attending: Gastroenterology | Admitting: Gastroenterology

## 2022-05-20 DIAGNOSIS — Z01812 Encounter for preprocedural laboratory examination: Secondary | ICD-10-CM | POA: Insufficient documentation

## 2022-05-20 LAB — PREGNANCY, URINE: Preg Test, Ur: NEGATIVE

## 2022-05-20 NOTE — Progress Notes (Signed)
Tried calling and call could not go through

## 2022-05-21 ENCOUNTER — Encounter (INDEPENDENT_AMBULATORY_CARE_PROVIDER_SITE_OTHER): Payer: Self-pay

## 2022-05-22 ENCOUNTER — Ambulatory Visit (HOSPITAL_COMMUNITY)
Admission: RE | Admit: 2022-05-22 | Discharge: 2022-05-22 | Disposition: A | Discharge status: Home / Self Care | Payer: Self-pay | Attending: Gastroenterology | Admitting: Gastroenterology

## 2022-05-22 ENCOUNTER — Ambulatory Visit (HOSPITAL_BASED_OUTPATIENT_CLINIC_OR_DEPARTMENT_OTHER): Payer: Self-pay | Admitting: Anesthesiology

## 2022-05-22 ENCOUNTER — Encounter (HOSPITAL_COMMUNITY): Payer: Self-pay | Admitting: Gastroenterology

## 2022-05-22 ENCOUNTER — Encounter (HOSPITAL_COMMUNITY)
Admission: RE | Disposition: A | Discharge status: Home / Self Care | Payer: Self-pay | Source: Home / Self Care | Attending: Gastroenterology

## 2022-05-22 ENCOUNTER — Encounter (INDEPENDENT_AMBULATORY_CARE_PROVIDER_SITE_OTHER): Payer: Self-pay | Admitting: *Deleted

## 2022-05-22 ENCOUNTER — Ambulatory Visit (HOSPITAL_COMMUNITY): Payer: Self-pay | Admitting: Anesthesiology

## 2022-05-22 DIAGNOSIS — K529 Noninfective gastroenteritis and colitis, unspecified: Secondary | ICD-10-CM

## 2022-05-22 DIAGNOSIS — F32A Depression, unspecified: Secondary | ICD-10-CM | POA: Insufficient documentation

## 2022-05-22 DIAGNOSIS — Z79899 Other long term (current) drug therapy: Secondary | ICD-10-CM | POA: Insufficient documentation

## 2022-05-22 DIAGNOSIS — K648 Other hemorrhoids: Secondary | ICD-10-CM

## 2022-05-22 DIAGNOSIS — R197 Diarrhea, unspecified: Secondary | ICD-10-CM

## 2022-05-22 DIAGNOSIS — K635 Polyp of colon: Secondary | ICD-10-CM

## 2022-05-22 DIAGNOSIS — I10 Essential (primary) hypertension: Secondary | ICD-10-CM | POA: Insufficient documentation

## 2022-05-22 DIAGNOSIS — Z01818 Encounter for other preprocedural examination: Secondary | ICD-10-CM

## 2022-05-22 DIAGNOSIS — K219 Gastro-esophageal reflux disease without esophagitis: Secondary | ICD-10-CM | POA: Insufficient documentation

## 2022-05-22 DIAGNOSIS — D122 Benign neoplasm of ascending colon: Secondary | ICD-10-CM | POA: Insufficient documentation

## 2022-05-22 DIAGNOSIS — F419 Anxiety disorder, unspecified: Secondary | ICD-10-CM | POA: Insufficient documentation

## 2022-05-22 DIAGNOSIS — F17210 Nicotine dependence, cigarettes, uncomplicated: Secondary | ICD-10-CM | POA: Insufficient documentation

## 2022-05-22 DIAGNOSIS — D124 Benign neoplasm of descending colon: Secondary | ICD-10-CM | POA: Insufficient documentation

## 2022-05-22 HISTORY — PX: POLYPECTOMY: SHX5525

## 2022-05-22 HISTORY — PX: COLONOSCOPY WITH PROPOFOL: SHX5780

## 2022-05-22 HISTORY — PX: BIOPSY: SHX5522

## 2022-05-22 LAB — HM COLONOSCOPY

## 2022-05-22 SURGERY — COLONOSCOPY WITH PROPOFOL
Anesthesia: General

## 2022-05-22 MED ORDER — PROPOFOL 10 MG/ML IV BOLUS
INTRAVENOUS | Status: DC | PRN
  Administered 2022-05-22: 60 mg via INTRAVENOUS

## 2022-05-22 MED ORDER — PROPOFOL 500 MG/50ML IV EMUL
INTRAVENOUS | Status: DC | PRN
  Administered 2022-05-22: 150 ug/kg/min via INTRAVENOUS

## 2022-05-22 MED ORDER — LACTATED RINGERS IV SOLN
INTRAVENOUS | Status: DC
  Administered 2022-05-22: 1000 mL via INTRAVENOUS

## 2022-05-22 MED ORDER — PROPOFOL 500 MG/50ML IV EMUL
INTRAVENOUS | Status: AC
  Filled 2022-05-22: qty 50

## 2022-05-22 MED ORDER — STERILE WATER FOR IRRIGATION IR SOLN
Status: DC | PRN
  Administered 2022-05-22: 100 mL

## 2022-05-22 NOTE — Interval H&P Note (Signed)
History and Physical Interval Note:  05/22/2022 10:53 AM  [name]  has presented today for surgery, with the diagnosis of Colitis continued diarrhea.  The various methods of treatment have been discussed with the patient and family. After consideration of risks, benefits and other options for treatment, the patient has consented to  Procedure(s) with comments: COLONOSCOPY WITH PROPOFOL (N/A) - 1215 ASA 2 as a surgical intervention.  The patient's history has been reviewed, patient examined, no change in status, stable for surgery.  I have reviewed the patient's chart and labs.  Questions were answered to the patient's satisfaction.     [name]

## 2022-05-22 NOTE — Anesthesia Preprocedure Evaluation (Signed)
Anesthesia Evaluation  Patient identified by MRN, date of birth, ID band Patient awake    Reviewed: Allergy & Precautions, H&P , NPO status , Patient's Chart, lab work & pertinent test results  Airway Mallampati: II  TM Distance: >3 FB Neck ROM: Full    Dental  (+) Edentulous Upper, Edentulous Lower   Pulmonary Current Smoker   Pulmonary exam normal breath sounds clear to auscultation       Cardiovascular Exercise Tolerance: Good hypertension, Pt. on medications Normal cardiovascular exam Rhythm:Regular Rate:Normal     Neuro/Psych  PSYCHIATRIC DISORDERS Anxiety Depression    negative neurological ROS     GI/Hepatic Neg liver ROS,GERD  Medicated and Controlled,,  Endo/Other  negative endocrine ROS    Renal/GU negative Renal ROS  negative genitourinary   Musculoskeletal  (+) Arthritis , Osteoarthritis,    Abdominal   Peds negative pediatric ROS (+)  Hematology negative hematology ROS (+)   Anesthesia Other Findings   Reproductive/Obstetrics negative OB ROS                             Anesthesia Physical Anesthesia Plan  ASA: 2  Anesthesia Plan: General   Post-op Pain Management: Minimal or no pain anticipated   Induction: Intravenous  PONV Risk Score and Plan:   Airway Management Planned: Nasal Cannula and Natural Airway  Additional Equipment:   Intra-op Plan:   Post-operative Plan:   Informed Consent: I have reviewed the patients History and Physical, chart, labs and discussed the procedure including the risks, benefits and alternatives for the proposed anesthesia with the patient or authorized representative who has indicated his/her understanding and acceptance.       Plan Discussed with: CRNA and Surgeon  Anesthesia Plan Comments:        Anesthesia Quick Evaluation

## 2022-05-22 NOTE — Anesthesia Postprocedure Evaluation (Signed)
Anesthesia Post Note  Patient: PRISILA DLOUHY  Procedure(s) Performed: COLONOSCOPY WITH PROPOFOL POLYPECTOMY BIOPSY  Patient location during evaluation: Phase II Anesthesia Type: General Level of consciousness: awake and alert and oriented Pain management: pain level controlled Vital Signs Assessment: post-procedure vital signs reviewed and stable Respiratory status: spontaneous breathing, nonlabored ventilation and respiratory function stable Cardiovascular status: blood pressure returned to baseline and stable Postop Assessment: no apparent nausea or vomiting Anesthetic complications: no  No notable events documented.   Last Vitals:  Vitals:   05/22/22 1040 05/22/22 1221  BP: 107/74 110/73  Pulse: 64 70  Resp: 16 15  Temp: 37.2 C 36.6 C  SpO2: 99% 98%    Last Pain:  Vitals:   05/22/22 1221  TempSrc: Oral  PainSc: 8                  Davisha Linthicum C Riddik Senna

## 2022-05-22 NOTE — Transfer of Care (Signed)
Immediate Anesthesia Transfer of Care Note  Patient: [name]  Procedure(s) Performed: COLONOSCOPY WITH PROPOFOL POLYPECTOMY BIOPSY  Patient Location: PACU  Anesthesia Type:General  Level of Consciousness: awake, alert , and oriented  Airway & Oxygen Therapy: Patient Spontanous Breathing  Post-op Assessment: Report given to RN, Post -op Vital signs reviewed and stable, Patient moving all extremities X 4, and Patient able to stick tongue midline  Post vital signs: Reviewed  Last Vitals:  Vitals Value Taken Time  BP 95/80   Temp 97.8   Pulse 70   Resp 11   SpO2 98     Last Pain:  Vitals:   05/22/22 1150  TempSrc:   PainSc: 0-No pain      Patients Stated Pain Goal: 8 (05/16/14 9458)  Complications: No notable events documented.

## 2022-05-22 NOTE — Op Note (Signed)
[facility] Patient Name: [name] Procedure Date: 05/22/2022 11:45 AM MRN: [id number] Date of Birth: 23-May-1976 Attending MD: Maylon Peppers , , [id number] CSN: [id number] Age: 46 Admit Type: Outpatient Procedure:                Colonoscopy Indications:              Chronic diarrhea Providers:                Maylon Peppers, Nogal Page Referring MD:              Medicines:                Monitored Anesthesia Care Complications:            No immediate complications. Estimated Blood Loss:     Estimated blood loss: none. Procedure:                Pre-Anesthesia Assessment:                           - Prior to the procedure, a History and Physical                            was performed, and patient medications, allergies                            and sensitivities were reviewed. The patient's                            tolerance of previous anesthesia was reviewed.                           - The risks and benefits of the procedure and the                            sedation options and risks were discussed with the                            patient. All questions were answered and informed                            consent was obtained.                           After obtaining informed consent, the colonoscope                            was passed under direct vision. Throughout the                            procedure, the patient's blood pressure, pulse, and                            oxygen saturations were monitored continuously. The                            [id number]-HQ190L ([id number]) scope was introduced through  the anus and advanced to the the terminal ileum.                            The colonoscopy was performed without difficulty.                            The patient tolerated the procedure well. The                            quality of the bowel preparation was adequate. Scope In: 11:53:20 AM Scope Out: 12:14:27  PM Scope Withdrawal Time: 0 hours 16 minutes 51 seconds  Total Procedure Duration: 0 hours 21 minutes 7 seconds  Findings:      The perianal and digital rectal examinations were normal.      Two sessile polyps were found in the descending colon and ascending       colon. The polyps were 3 to 5 mm in size. These polyps were removed with       a cold snare. Resection and retrieval were complete.      The rest of the colon appeared normal. Biopsies for histology were taken       with a cold forceps from the right colon and left colon for evaluation       of microscopic colitis.      Non-bleeding internal hemorrhoids were found during retroflexion. The       hemorrhoids were small. Impression:               - Two 3 to 5 mm polyps in the descending colon and                            in the ascending colon, removed with a cold snare.                            Resected and retrieved.                           - The rest of the examined colon is normal.                            Biopsied.                           - Non-bleeding internal hemorrhoids. Moderate Sedation:      Per Anesthesia Care Recommendation:           - Discharge patient to home (ambulatory).                           - Resume previous diet.                           - Await pathology results.                           - Repeat colonoscopy for surveillance based on  pathology results.                           - Take Imodium as needed for diarrhea, can take up                            to 6 tablets per day. Procedure Code(s):        --- Professional ---                           [phone], Colonoscopy, flexible; with removal of                            tumor(s), polyp(s), or other lesion(s) by snare                            technique                           45380, 35, Colonoscopy, flexible; with biopsy,                            single or multiple Diagnosis Code(s):        --- Professional  ---                           D12.4, Benign neoplasm of descending colon                           D12.2, Benign neoplasm of ascending colon                           K64.8, Other hemorrhoids                           K52.9, Noninfective gastroenteritis and colitis,                            unspecified CPT copyright 2022 American Medical Association. All rights reserved. The codes documented in this report are preliminary and upon coder review may  be revised to meet current compliance requirements. Maylon Peppers, MD Maylon Peppers,  05/22/2022 12:25:54 PM This report has been signed electronically. Number of Addenda: 0

## 2022-05-22 NOTE — Discharge Instructions (Signed)
You are being discharged to home.  Resume your previous diet.  We are waiting for your pathology results.  Your physician has recommended a repeat colonoscopy for surveillance based on pathology results.  Take Imodium as needed for diarrhea, can take up to 6 tablets per day.

## 2022-05-23 LAB — SURGICAL PATHOLOGY

## 2022-05-26 ENCOUNTER — Encounter (INDEPENDENT_AMBULATORY_CARE_PROVIDER_SITE_OTHER): Payer: Self-pay | Admitting: *Deleted

## 2022-05-27 ENCOUNTER — Telehealth (INDEPENDENT_AMBULATORY_CARE_PROVIDER_SITE_OTHER): Payer: Self-pay

## 2022-05-27 NOTE — Telephone Encounter (Signed)
7 yr TCS noted in recall

## 2022-05-27 NOTE — Telephone Encounter (Signed)
Patient left voicemail requesting a call back to discuss colonoscopy results.   Patients personal phone is not working, would like a call back at [phone].

## 2022-05-27 NOTE — Telephone Encounter (Signed)
Patient made aware of her pathology results and the need for a repeat in seven years on her TCS. Ann please place in recalls thanks,   Result letter mailed to patient   Harvel Quale, MD 05/23/2022 11:53 AM EST     I reviewed the pathology results. I called the patient and to inform about the results, but the patient did not pick up the phone. Could not leave a voice message as the listed phone in the chart is not working. Ann, can you send her a letter with the findings please? Pathology showed 1 tubular adenoma and 1 hyperplastic polyp, random colonic biopsies were normal, repeat colonoscopy in 7 years.  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   Thanks,   Maylon Peppers, MD Gastroenterology and Hepatology [facility] Gastroenterology    Patient Communication   Add Comments   Not seen Back to Top

## 2022-05-27 NOTE — Telephone Encounter (Signed)
I spoke with the patient regarding the results, made her aware of the suggestion of repeat TCS in seven years. Patient states understanding and forwarded to Lelon Frohlich to place in recalls for seven years from now. Changed the patient phone number in Epic per patient request.

## 2022-05-28 ENCOUNTER — Encounter (HOSPITAL_COMMUNITY): Payer: Self-pay | Admitting: Gastroenterology

## 2022-07-11 ENCOUNTER — Encounter (INDEPENDENT_AMBULATORY_CARE_PROVIDER_SITE_OTHER): Payer: Self-pay | Admitting: Gastroenterology

## 2022-08-12 ENCOUNTER — Ambulatory Visit (INDEPENDENT_AMBULATORY_CARE_PROVIDER_SITE_OTHER): Payer: Self-pay | Admitting: Gastroenterology

## 2022-09-15 ENCOUNTER — Ambulatory Visit (INDEPENDENT_AMBULATORY_CARE_PROVIDER_SITE_OTHER): Payer: Self-pay | Admitting: Gastroenterology

## 2022-09-15 ENCOUNTER — Encounter (INDEPENDENT_AMBULATORY_CARE_PROVIDER_SITE_OTHER): Payer: Self-pay | Admitting: Gastroenterology

## 2022-11-03 ENCOUNTER — Encounter (INDEPENDENT_AMBULATORY_CARE_PROVIDER_SITE_OTHER): Payer: Self-pay | Admitting: Gastroenterology

## 2022-11-03 ENCOUNTER — Ambulatory Visit (INDEPENDENT_AMBULATORY_CARE_PROVIDER_SITE_OTHER): Payer: Medicaid Other | Admitting: Gastroenterology

## 2022-11-03 VITALS — BP 127/78 | HR 86 | Temp 97.8°F | Ht 64.0 in | Wt 209.1 lb

## 2022-11-03 DIAGNOSIS — R112 Nausea with vomiting, unspecified: Secondary | ICD-10-CM

## 2022-11-03 DIAGNOSIS — R1010 Upper abdominal pain, unspecified: Secondary | ICD-10-CM | POA: Diagnosis not present

## 2022-11-03 DIAGNOSIS — R1011 Right upper quadrant pain: Secondary | ICD-10-CM | POA: Insufficient documentation

## 2022-11-03 MED ORDER — OMEPRAZOLE 40 MG PO CPDR: 40.0000 mg | DELAYED_RELEASE_CAPSULE | Freq: Every day | ORAL | 2 refills | Status: DC

## 2022-11-03 NOTE — Patient Instructions (Addendum)
Stop protonix I have sent Omeprazole  to your pharmacy Please take this 30 minutes prior to breakfast Avoid greasy, spicy, fried, citrus foods, and be mindful that caffeine, carbonated drinks, chocolate and alcohol can increase reflux symptoms Stay upright 2-3 hours after eating, prior to lying down and avoid eating late in the evenings.  We will schedule EGD for further evaluation of your symptoms  Follow up 3 months  It was a pleasure to see you today. I want to create trusting relationships with patients and provide genuine, compassionate, and quality care. I truly value your feedback! please be on the lookout for a survey regarding your visit with me today. I appreciate your input about our visit and your time in completing this!    Bridget Hearne L. Jeanmarie Hubert, MSN, APRN, AGNP-C Adult-Gerontology Nurse Practitioner [facility] Gastroenterology at [facility]

## 2022-11-03 NOTE — Progress Notes (Signed)
Referring Provider: Health, Matthew Folks* Primary Care Physician:  Health, [facility] Primary GI Physician: Levon Hedger   Chief Complaint  Patient presents with   colitis    Follow up on colitis. Reports off and on she has abdominal pain. Twisting pain. Has nausea every day and vomits about twice a week. Gets full quick. Stopped hyscocymine after 2 days due to side effects.   HPI:   [name] is a 47 y.o. female with past medical history of anxiety, depression, HTN   Patient presenting today for follow up of colitis.  Last seen October 2023, at that time, recent ED visit in August with  CT Circumferential wall thickening with adjacent inflammatory stranding involving the ascending colon most consistent with an infectious or inflammatory colitis.No evidence of perforation or pericolonic abscess. At last visit she c/o BMs 4-5x/day with continued abdominal pain and fecal urgency. No nausea or vomiting. Had some weight loss.  Recommended stool studies, Levsin 0.125mg  q6h PRN, probiotic, schedule colonoscopy.- as below, advised to take imodium PRN.  Present:  Patient states she is still having mostly very soft stools, no watery diarrhea. having to run to the restroom as soon as she wakes up with 3-4 BMs per day. She is having a lot of nausea, sometimes vomits x1 month. She notes some severe abdominal pain that feels like a twisting pain, this occurred after a BM and happened once, she had to lay down until pain resolved. She states appetite is not great. She does not feel hungry. She has early satiety, notes she feels full and bloated all the time. She has no rectal bleeding or melena. She notes constant abdominal discomfort, sometimes eating makes this worse. She is trying to watch what she is eating and doing more bland, less greasy foods. She is not taking NSAIDs, she has not drank alcohol since last August. She states she took levsin but felt like she was having more  lower abdominal cramping so she stopped it.   She has history of GERD, she is on protonix  daily, she notes heartburn almost daily. She has to take tums or rolaids to help with her symptoms.    Last Colonoscopy: 05/2022 - Two 3 to 5 mm polyps in the descending colon and                            in the ascending colon, removed with a cold snare.                            Resected and retrieved.                           - The rest of the examined colon is normal.                            Biopsied.                           - Non-bleeding internal hemorrhoids. ( 1 TA, 1 hyperplastic polyp, random biopsies normal)  Last Endoscopy:many years ago   EGD: many years ago  Recommendations:  Repeat TCS in 7 years   Past Medical History:  Diagnosis Date   Anxiety    [employer] 12/30/2019   Depression    GERD (  gastroesophageal reflux disease)    Hypertension     Past Surgical History:  Procedure Laterality Date   BIOPSY  05/22/2022   Procedure: BIOPSY;  Surgeon: Dolores Frame, MD;  [facility];  Service: Gastroenterology;;   CHOLECYSTECTOMY     COLONOSCOPY WITH PROPOFOL N/A 05/22/2022   Procedure: COLONOSCOPY WITH PROPOFOL;  Surgeon: Dolores Frame, MD;  Location: [facility];  Service: Gastroenterology;  Laterality: N/A;  1215 ASA 2   POLYPECTOMY  05/22/2022   Procedure: POLYPECTOMY;  Surgeon: Marguerita Merles, Reuel Boom, MD;  Location: [facility];  Service: Gastroenterology;;   TUBAL LIGATION      Current Outpatient Medications  Medication Sig Dispense Refill   amLODipine (NORVASC) 10 MG tablet Take 1 tablet (10 mg total) by mouth daily. 30 tablet 3   aspirin EC 81 MG tablet Take 81 mg by mouth once as needed for mild pain or moderate pain.     meclizine (ANTIVERT) 25 MG tablet Take 1 tablet (25 mg total) by mouth 3 (three) times daily as needed for dizziness. 30 tablet 0   metoprolol tartrate (LOPRESSOR) 50 MG tablet Take 1 tablet by mouth  twice daily 60 tablet 2   Omega-3 Fatty Acids (FISH OIL PO) Take by mouth. 2,000 mg bid     pantoprazole (PROTONIX) 40 MG tablet Take 40 mg by mouth daily. One in the morning     vitamin B-12 (CYANOCOBALAMIN) 1000 MCG tablet Take 1 tablet (1,000 mcg total) by mouth daily. 60 tablet 1   hyoscyamine (LEVSIN SL) 0.125 MG SL tablet Place 1 tablet (0.125 mg total) under the tongue every 6 (six) hours as needed. (Patient not taking: Reported on 11/03/2022) 90 tablet 1   No current facility-administered medications for this visit.    Allergies as of 11/03/2022 - Review Complete 11/03/2022  Allergen Reaction Noted   Other  09/13/2012    Family History  Problem Relation Age of Onset   Hypertension Father    Hypercholesterolemia Father    COPD Mother    Emphysema Mother     Social History   Socioeconomic History   Marital status: Legally Separated    Spouse name: Not on file   Number of children: Not on file   Years of education: Not on file   Highest education level: Not on file  Occupational History   Not on file  Tobacco Use   Smoking status: Every Day    Packs/day: 1.50    Years: 22.00    Additional pack years: 0.00    Total pack years: 33.00    Types: Cigarettes    Passive exposure: Current   Smokeless tobacco: Never  Vaping Use   Vaping Use: Never used  Substance and Sexual Activity   Alcohol use: Not Currently   Drug use: No   Sexual activity: Yes    Birth control/protection: Surgical  Other Topics Concern   Not on file  Social History Narrative   Not on file   Social Determinants of Health   Financial Resource Strain: Not on file  Food Insecurity: Not on file  Transportation Needs: Not on file  Physical Activity: Not on file  Stress: Not on file  Social Connections: Not on file   Review of systems General: negative for malaise, night sweats, fever, chills, weight loss Neck: Negative for lumps, goiter, pain and significant neck swelling Resp: Negative for  cough, wheezing, dyspnea at rest CV: Negative for chest pain, leg swelling, palpitations, orthopnea GI:  denies melena, hematochezia, diarrhea, constipation, dysphagia, odyonophagia, or unintentional weight loss. +nausea/vomiting +upper abdominal pain +early satiety  MSK: Negative for joint pain or swelling, back pain, and muscle pain. Derm: Negative for itching or rash Psych: Denies depression, anxiety, memory loss, confusion. No homicidal or suicidal ideation.  Heme: Negative for prolonged bleeding, bruising easily, and swollen nodes. Endocrine: Negative for cold or heat intolerance, polyuria, polydipsia and goiter. Neuro: negative for tremor, gait imbalance, syncope and seizures. The remainder of the review of systems is noncontributory.  Physical Exam: BP 127/78 (BP Location: Right Arm, Patient Position: Sitting, Cuff Size: Large)   Pulse 86   Temp 97.8 F (36.6 C) (Oral)   Ht  (1.626 m)   Wt 209 lb 1.6 oz (94.8 kg)   BMI 35.89 kg/m  General:   Alert and oriented. No distress noted. Pleasant and cooperative.  Head:  Normocephalic and atraumatic. Eyes:  Conjuctiva clear without scleral icterus. Mouth:  Oral mucosa pink and moist. Good dentition. No lesions. Heart: Normal rate and rhythm, s1 and s2 heart sounds present.  Lungs: Clear lung sounds in all lobes. Respirations equal and unlabored. Abdomen:  +BS, soft,  and non-distended. TTP of epigastric region. No rebound or guarding. No HSM or masses noted. Derm: No palmar erythema or jaundice Msk:  Symmetrical without gross deformities. Normal posture. Extremities:  Without edema. Neurologic:  Alert and  oriented x4 Psych:  Alert and cooperative. Normal mood and affect.  Invalid input(s): "6 MONTHS"   ASSESSMENT: [name] is a 47 y.o. female presenting today for follow up of diarrhea and nausea/vomiting and epigastric pain.  Looser stools: now having more soft/loosestools, no watery diarrhea. Trying to avoid  certain foods that worsen symptoms. She did not tolerate levsin. 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 Symptoms appear to be improving. Should continue with dietary changes and avoiding trigger foods  Nausea/vomiting: x1 month with epigastric pain and early satiety. No NSAIDs or ETOH, on protonix  daily without improvement, having GERD symptoms multiple times a week. Given early satiety, will proceed with upper endoscopy for further evaluation as I cannot rule out gastritis, duodenitis, PUD, H pylori. Can stop protonix and start omeprazole  daily. Indications, risks and benefits of procedure discussed in detail with patient. Patient verbalized understanding and is in agreement to proceed with EGD.    PLAN:  Stop protonix  2. Start omeprazole  daily  3. Schedule EGD-ASA II 4. Continue to avoid trigger foods  All questions were answered, patient verbalized understanding and is in agreement with plan as outlined above.   Follow up 3 months   Bridget Guadron L. Jeanmarie Hubert, MSN, APRN, AGNP-C Adult-Gerontology Nurse Practitioner [facility]  I have reviewed the note and agree with the APP's assessment as described in this progress note  It is possible she has developed post infectious IBS-D - may consider trial of Xifaxan if persisting loose Bms.  Katrinka Blazing, MD Gastroenterology and Hepatology [facility] Gastroenterology

## 2022-11-03 NOTE — H&P (View-Only) (Signed)
 Referring Provider: [facility] Primary Care Physician:  [facility] Primary GI Physician: Castaneda   Chief Complaint  Patient presents with   colitis    Follow up on colitis. Reports off and on she has abdominal pain. Twisting pain. Has nausea every day and vomits about twice a week. Gets full quick. Stopped hyscocymine after 2 days due to side effects.   HPI:   [name] is a 46 y.o. female with past medical history of anxiety, depression, HTN   Patient presenting today for follow up of colitis.  Last seen October 2023, at that time, recent ED visit in August with  CT Circumferential wall thickening with adjacent inflammatory stranding involving the ascending colon most consistent with an infectious or inflammatory colitis.No evidence of perforation or pericolonic abscess. At last visit she c/o BMs 4-5x/day with continued abdominal pain and fecal urgency. No nausea or vomiting. Had some weight loss.  Recommended stool studies, Levsin 0.125mg q6h PRN, probiotic, schedule colonoscopy.- as below, advised to take imodium PRN.  Present:  Patient states she is still having mostly very soft stools, no watery diarrhea. having to run to the restroom as soon as she wakes up with 3-4 BMs per day. She is having a lot of nausea, sometimes vomits x1 month. She notes some severe abdominal pain that feels like a twisting pain, this occurred after a BM and happened once, she had to lay down until pain resolved. She states appetite is not great. She does not feel hungry. She has early satiety, notes she feels full and bloated all the time. She has no rectal bleeding or melena. She notes constant abdominal discomfort, sometimes eating makes this worse. She is trying to watch what she is eating and doing more bland, less greasy foods. She is not taking NSAIDs, she has not drank alcohol since last August. She states she took levsin but felt like she was having more  lower abdominal cramping so she stopped it.   She has history of GERD, she is on protonix 40mg daily, she notes heartburn almost daily. She has to take tums or rolaids to help with her symptoms.    Last Colonoscopy: 05/2022 - Two 3 to 5 mm polyps in the descending colon and                            in the ascending colon, removed with a cold snare.                            Resected and retrieved.                           - The rest of the examined colon is normal.                            Biopsied.                           - Non-bleeding internal hemorrhoids. ( 1 TA, 1 hyperplastic polyp, random biopsies normal)  Last Endoscopy:many years ago   EGD: many years ago  Recommendations:  Repeat TCS in 7 years   Past Medical History:  Diagnosis Date   Anxiety    [employer] 12/30/2019   Depression    GERD (  gastroesophageal reflux disease)    Hypertension     Past Surgical History:  Procedure Laterality Date   BIOPSY  05/22/2022   Procedure: BIOPSY;  Surgeon: Castaneda Mayorga, Daniel, MD;  [facility];  Service: Gastroenterology;;   CHOLECYSTECTOMY     COLONOSCOPY WITH PROPOFOL N/A 05/22/2022   Procedure: COLONOSCOPY WITH PROPOFOL;  Surgeon: Castaneda Mayorga, Daniel, MD;  [facility]: [facility];  Service: Gastroenterology;  Laterality: N/A;  1215 ASA 2   POLYPECTOMY  05/22/2022   Procedure: POLYPECTOMY;  Surgeon: Castaneda Mayorga, Daniel, MD;  [facility]: [facility];  Service: Gastroenterology;;   TUBAL LIGATION      Current Outpatient Medications  Medication Sig Dispense Refill   amLODipine (NORVASC) 10 MG tablet Take 1 tablet (10 mg total) by mouth daily. 30 tablet 3   aspirin EC 81 MG tablet Take 81 mg by mouth once as needed for mild pain or moderate pain.     meclizine (ANTIVERT) 25 MG tablet Take 1 tablet (25 mg total) by mouth 3 (three) times daily as needed for dizziness. 30 tablet 0   metoprolol tartrate (LOPRESSOR) 50 MG tablet Take 1 tablet by mouth  twice daily 60 tablet 2   Omega-3 Fatty Acids (FISH OIL PO) Take by mouth. 2,000 mg bid     pantoprazole (PROTONIX) 40 MG tablet Take 40 mg by mouth daily. One in the morning     vitamin B-12 (CYANOCOBALAMIN) 1000 MCG tablet Take 1 tablet (1,000 mcg total) by mouth daily. 60 tablet 1   hyoscyamine (LEVSIN SL) 0.125 MG SL tablet Place 1 tablet (0.125 mg total) under the tongue every 6 (six) hours as needed. (Patient not taking: Reported on 11/03/2022) 90 tablet 1   No current facility-administered medications for this visit.    Allergies as of 11/03/2022 - Review Complete 11/03/2022  Allergen Reaction Noted   Other  09/13/2012    Family History  Problem Relation Age of Onset   Hypertension Father    Hypercholesterolemia Father    COPD Mother    Emphysema Mother     Social History   Socioeconomic History   Marital status: Legally Separated    Spouse name: Not on file   Number of children: Not on file   Years of education: Not on file   Highest education level: Not on file  Occupational History   Not on file  Tobacco Use   Smoking status: Every Day    Packs/day: 1.50    Years: 22.00    Additional pack years: 0.00    Total pack years: 33.00    Types: Cigarettes    Passive exposure: Current   Smokeless tobacco: Never  Vaping Use   Vaping Use: Never used  Substance and Sexual Activity   Alcohol use: Not Currently   Drug use: No   Sexual activity: Yes    Birth control/protection: Surgical  Other Topics Concern   Not on file  Social History Narrative   Not on file   Social Determinants of Health   Financial Resource Strain: Not on file  Food Insecurity: Not on file  Transportation Needs: Not on file  Physical Activity: Not on file  Stress: Not on file  Social Connections: Not on file   Review of systems General: negative for malaise, night sweats, fever, chills, weight loss Neck: Negative for lumps, goiter, pain and significant neck swelling Resp: Negative for  cough, wheezing, dyspnea at rest CV: Negative for chest pain, leg swelling, palpitations, orthopnea GI:   denies melena, hematochezia, diarrhea, constipation, dysphagia, odyonophagia, or unintentional weight loss. +nausea/vomiting +upper abdominal pain +early satiety  MSK: Negative for joint pain or swelling, back pain, and muscle pain. Derm: Negative for itching or rash Psych: Denies depression, anxiety, memory loss, confusion. No homicidal or suicidal ideation.  Heme: Negative for prolonged bleeding, bruising easily, and swollen nodes. Endocrine: Negative for cold or heat intolerance, polyuria, polydipsia and goiter. Neuro: negative for tremor, gait imbalance, syncope and seizures. The remainder of the review of systems is noncontributory.  Physical Exam: BP 127/78 (BP Location: Right Arm, Patient Position: Sitting, Cuff Size: Large)   Pulse 86   Temp 97.8 F (36.6 C) (Oral)   Ht 5' 4" (1.626 m)   Wt 209 lb 1.6 oz (94.8 kg)   BMI 35.89 kg/m  General:   Alert and oriented. No distress noted. Pleasant and cooperative.  Head:  Normocephalic and atraumatic. Eyes:  Conjuctiva clear without scleral icterus. Mouth:  Oral mucosa pink and moist. Good dentition. No lesions. Heart: Normal rate and rhythm, s1 and s2 heart sounds present.  Lungs: Clear lung sounds in all lobes. Respirations equal and unlabored. Abdomen:  +BS, soft,  and non-distended. TTP of epigastric region. No rebound or guarding. No HSM or masses noted. Derm: No palmar erythema or jaundice Msk:  Symmetrical without gross deformities. Normal posture. Extremities:  Without edema. Neurologic:  Alert and  oriented x4 Psych:  Alert and cooperative. Normal mood and affect.  Invalid input(s): "6 MONTHS"   ASSESSMENT: [name] is a 46 y.o. female presenting today for follow up of diarrhea and nausea/vomiting and epigastric pain.  Looser stools: now having more soft/loosestools, no watery diarrhea. Trying to avoid  certain foods that worsen symptoms. She did not tolerate levsin. 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 Symptoms appear to be improving. Should continue with dietary changes and avoiding trigger foods  Nausea/vomiting: x1 month with epigastric pain and early satiety. No NSAIDs or ETOH, on protonix 40mg daily without improvement, having GERD symptoms multiple times a week. Given early satiety, will proceed with upper endoscopy for further evaluation as I cannot rule out gastritis, duodenitis, PUD, H pylori. Can stop protonix and start omeprazole 40mg daily. Indications, risks and benefits of procedure discussed in detail with patient. Patient verbalized understanding and is in agreement to proceed with EGD.    PLAN:  Stop protonix  2. Start omeprazole 40mg daily  3. Schedule EGD-ASA II 4. Continue to avoid trigger foods  All questions were answered, patient verbalized understanding and is in agreement with plan as outlined above.   Follow up 3 months   Zahria Ding L. 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  Daniel Castaneda, MD Gastroenterology and Hepatology [place] [facility] 

## 2022-11-05 ENCOUNTER — Ambulatory Visit: Payer: Medicaid Other | Admitting: Family Medicine

## 2022-11-05 ENCOUNTER — Encounter: Payer: Self-pay | Admitting: Family Medicine

## 2022-11-05 ENCOUNTER — Telehealth (INDEPENDENT_AMBULATORY_CARE_PROVIDER_SITE_OTHER): Payer: Self-pay | Admitting: Gastroenterology

## 2022-11-05 VITALS — BP 113/81 | HR 88 | Ht 64.0 in | Wt 209.0 lb

## 2022-11-05 DIAGNOSIS — R0602 Shortness of breath: Secondary | ICD-10-CM | POA: Diagnosis not present

## 2022-11-05 DIAGNOSIS — Z114 Encounter for screening for human immunodeficiency virus [HIV]: Secondary | ICD-10-CM | POA: Diagnosis not present

## 2022-11-05 DIAGNOSIS — N3281 Overactive bladder: Secondary | ICD-10-CM | POA: Diagnosis not present

## 2022-11-05 DIAGNOSIS — Z122 Encounter for screening for malignant neoplasm of respiratory organs: Secondary | ICD-10-CM

## 2022-11-05 DIAGNOSIS — E559 Vitamin D deficiency, unspecified: Secondary | ICD-10-CM

## 2022-11-05 DIAGNOSIS — I10 Essential (primary) hypertension: Secondary | ICD-10-CM

## 2022-11-05 DIAGNOSIS — E0789 Other specified disorders of thyroid: Secondary | ICD-10-CM

## 2022-11-05 DIAGNOSIS — Z1159 Encounter for screening for other viral diseases: Secondary | ICD-10-CM

## 2022-11-05 DIAGNOSIS — Z1231 Encounter for screening mammogram for malignant neoplasm of breast: Secondary | ICD-10-CM

## 2022-11-05 DIAGNOSIS — E7849 Other hyperlipidemia: Secondary | ICD-10-CM

## 2022-11-05 DIAGNOSIS — Z23 Encounter for immunization: Secondary | ICD-10-CM | POA: Diagnosis not present

## 2022-11-05 DIAGNOSIS — R7301 Impaired fasting glucose: Secondary | ICD-10-CM | POA: Diagnosis not present

## 2022-11-05 MED ORDER — ALBUTEROL SULFATE HFA 108 (90 BASE) MCG/ACT IN AERS: 2.0000 | INHALATION_SPRAY | Freq: Four times a day (QID) | RESPIRATORY_TRACT | 2 refills | Status: DC | PRN

## 2022-11-05 MED ORDER — METOPROLOL TARTRATE 50 MG PO TABS: 50.0000 mg | ORAL_TABLET | Freq: Two times a day (BID) | ORAL | 2 refills | Status: DC

## 2022-11-05 MED ORDER — MIRABEGRON ER 25 MG PO TB24: 25.0000 mg | ORAL_TABLET | Freq: Every day | ORAL | 3 refills | Status: DC

## 2022-11-05 MED ORDER — AMLODIPINE BESYLATE 10 MG PO TABS: 10.0000 mg | ORAL_TABLET | Freq: Every day | ORAL | 1 refills | Status: DC

## 2022-11-05 NOTE — Assessment & Plan Note (Addendum)
Trial on Myrbetriq 25 mg Discussed non-pharmacological interventions such as pelvic floor muscle training with Kegel exercises, behavioral training -gradually hold urine for longer periods, schedule toilet trip, to urinate every two to four hours rather than waiting for the need to go. Avoid or cut back on alcohol, caffeine or acidic foods. 

## 2022-11-05 NOTE — Telephone Encounter (Signed)
No PA needed for EGD per [facility]

## 2022-11-05 NOTE — Assessment & Plan Note (Signed)
Vitals:   11/05/22 0933  BP: 113/81   Blood pressure controlled in today's visit Refilled Metoprolol 50 mg and Norvasc 10 mg Discussed to follow a DASH diet which includes vegetables,fruits,whole grains, fat free or low fat diary,fish,poultry,beans,nuts and seeds,vegetable oils. Find an activity that you will enjoy and start to be active at least 5 days a week for 30 minutes each day.

## 2022-11-05 NOTE — Progress Notes (Signed)
New Patient Office Visit   Subjective   Patient ID: [name], female    DOB: 02-15-1976  Age: 47 y.o. MRN: [id number]  CC:  Chief Complaint  Patient presents with   Establish Care    New patient establishing care, previously seen by the health department needs refills on bp medication.     HPI [name] 47 year old female, presents to establish care. She  has a past medical history of Anxiety, Depression, GERD (gastroesophageal reflux disease), and Hypertension.  Hypertension: Patient here for follow-up of elevated blood pressure. She is exercising and is adherent to low salt diet.  Blood pressure is well controlled at home. Patient denies cardiac symptoms chest pain, lower extremity edema, near-syncope, palpitations, and syncope. Cardiovascular risk factors: dyslipidemia, hypertension, obesity (BMI >= 30 kg/m2), and smoking/ tobacco exposure.   Patient complains of urinary incontinence. This has been present for several months. She leaks urine with walking, sneezing, with urge. Patient describes the symptoms as  a blunted sense of urge to urinate, frequent urination (20x per day), urge to urinate with little or no warning, and urine leakage with coughing/heavy physical activity.  Factors associated with symptoms include anxiety. Evaluation to date includes none.Treatment to date includes none.       Outpatient Encounter Medications as of 11/05/2022  Medication Sig   albuterol (VENTOLIN HFA) 108 (90 Base) MCG/ACT inhaler Inhale 2 puffs into the lungs every 6 (six) hours as needed for wheezing or shortness of breath.   aspirin EC 81 MG tablet Take 81 mg by mouth once as needed for mild pain or moderate pain.   meclizine (ANTIVERT) 25 MG tablet Take 1 tablet (25 mg total) by mouth 3 (three) times daily as needed for dizziness.   mirabegron ER (MYRBETRIQ) 25 MG TB24 tablet Take 1 tablet (25 mg total) by mouth daily.   Omega-3 Fatty Acids (FISH OIL PO) Take by mouth.  2,000 mg bid   omeprazole (PRILOSEC) 40 MG capsule Take 1 capsule (40 mg total) by mouth daily.   vitamin B-12 (CYANOCOBALAMIN) 1000 MCG tablet Take 1 tablet (1,000 mcg total) by mouth daily.   [DISCONTINUED] amLODipine (NORVASC) 10 MG tablet Take 1 tablet (10 mg total) by mouth daily.   [DISCONTINUED] metoprolol tartrate (LOPRESSOR) 50 MG tablet Take 1 tablet by mouth twice daily   amLODipine (NORVASC) 10 MG tablet Take 1 tablet (10 mg total) by mouth daily.   metoprolol tartrate (LOPRESSOR) 50 MG tablet Take 1 tablet (50 mg total) by mouth 2 (two) times daily.   No facility-administered encounter medications on file as of 11/05/2022.    Past Surgical History:  Procedure Laterality Date   BIOPSY  05/22/2022   Procedure: BIOPSY;  Surgeon: Dolores Frame, MD;  [facility];  Service: Gastroenterology;;   CHOLECYSTECTOMY     COLONOSCOPY WITH PROPOFOL N/A 05/22/2022   Procedure: COLONOSCOPY WITH PROPOFOL;  Surgeon: Dolores Frame, MD;  [facility];  Service: Gastroenterology;  Laterality: N/A;  1215 ASA 2   POLYPECTOMY  05/22/2022   Procedure: POLYPECTOMY;  Surgeon: Dolores Frame, MD;  [facility];  Service: Gastroenterology;;   TUBAL LIGATION      Review of Systems  Eyes:  Negative for blurred vision.  Respiratory:  Positive for shortness of breath. Negative for cough.   Cardiovascular:  Negative for chest pain.  Genitourinary:  Positive for frequency and urgency. Negative for dysuria, flank pain and hematuria.  Neurological:  Negative for dizziness and headaches.      Objective    BP 113/81   Pulse 88   Ht 5\' 4"  (1.626 m)   Wt 209 lb 0.6 oz (94.8 kg)   SpO2 95%   BMI 35.88 kg/m   Physical Exam Vitals reviewed.  Constitutional:      General: She is not in acute distress.    Appearance: Normal appearance. She is not ill-appearing, toxic-appearing or diaphoretic.  HENT:     Head: Normocephalic.  Eyes:      General:        Right eye: No discharge.        Left eye: No discharge.     Conjunctiva/sclera: Conjunctivae normal.  Cardiovascular:     Rate and Rhythm: Normal rate.     Pulses: Normal pulses.     Heart sounds: Normal heart sounds.  Pulmonary:     Effort: Pulmonary effort is normal. No respiratory distress.     Breath sounds: Normal breath sounds.  Abdominal:     General: Bowel sounds are normal.     Palpations: Abdomen is soft.     Tenderness: There is no right CVA tenderness or left CVA tenderness.  Musculoskeletal:        General: Normal range of motion.     Cervical back: Normal range of motion.  Skin:    General: Skin is warm and dry.     Capillary Refill: Capillary refill takes less than 2 seconds.  Neurological:     General: No focal deficit present.     Mental Status: She is alert and oriented to person, place, and time.     Coordination: Coordination normal.     Gait: Gait normal.  Psychiatric:        Mood and Affect: Mood normal.        Behavior: Behavior normal.       Assessment & Plan:  Primary hypertension Assessment & Plan: Vitals:   11/05/22 0933  BP: 113/81   Blood pressure controlled in today's visit Refilled Metoprolol 50 mg and Norvasc 10 mg Discussed to follow a DASH diet which includes vegetables,fruits,whole grains, fat free or low fat diary,fish,poultry,beans,nuts and seeds,vegetable oils. Find an activity that you will enjoy and start to be active at least 5 days a week for 30 minutes each day.   Orders: -     Microalbumin / creatinine urine ratio  Vitamin D deficiency -     VITAMIN D 25 Hydroxy (Vit-D Deficiency, Fractures)  Impaired fasting blood sugar -     Hemoglobin A1c  Other hyperlipidemia -     CMP14+EGFR -     CBC with Differential/Platelet -     Lipid panel  Other specified disorders of thyroid -     TSH + free T4  Encounter for hepatitis C screening test for low risk patient -     Hepatitis C antibody  Immunization  due -     Tdap vaccine greater than or equal to 7yo IM  Screening for HIV (human immunodeficiency virus) -     HIV Antibody (routine testing w rflx)  Essential hypertension -     amLODIPine Besylate; Take 1 tablet (10 mg total) by mouth daily.  Dispense: 90 tablet; Refill: 1 -     Metoprolol Tartrate; Take 1 tablet (50 mg total) by mouth 2 (two) times daily.  Dispense: 90 tablet; Refill: 2  Screening for lung cancer -     Ambulatory Referral for Lung  Cancer Scre  Screening mammogram for breast cancer -     Digital Screening Mammogram, Left and Right; Future  Overactive bladder Assessment & Plan: Trial on Myrbetriq 25 mg Discussed non-pharmacological interventions such as pelvic floor muscle training with Kegel exercises, behavioral training -gradually hold urine for longer periods, schedule toilet trip, to urinate every two to four hours rather than waiting for the need to go. Avoid or cut back on alcohol, caffeine or acidic foods.  Orders: -     Mirabegron ER; Take 1 tablet (25 mg total) by mouth daily.  Dispense: 30 tablet; Refill: 3  SOB (shortness of breath) -     Albuterol Sulfate HFA; Inhale 2 puffs into the lungs every 6 (six) hours as needed for wheezing or shortness of breath.  Dispense: 8 g; Refill: 2    Return in about 1 month (around 12/05/2022) for Pap smear.   Cruzita Lederer Newman Nip, FNP

## 2022-11-05 NOTE — Patient Instructions (Addendum)

## 2022-11-07 LAB — CMP14+EGFR
ALT: 17 IU/L (ref 0–32)
AST: 15 IU/L (ref 0–40)
Albumin/Globulin Ratio: 1.5 (ref 1.2–2.2)
Albumin: 4.5 g/dL (ref 3.9–4.9)
Alkaline Phosphatase: 106 IU/L (ref 44–121)
BUN/Creatinine Ratio: 9 (ref 9–23)
BUN: 9 mg/dL (ref 6–24)
Bilirubin Total: 0.4 mg/dL (ref 0.0–1.2)
CO2: 18 mmol/L — ABNORMAL LOW (ref 20–29)
Calcium: 9.5 mg/dL (ref 8.7–10.2)
Chloride: 101 mmol/L (ref 96–106)
Creatinine, Ser: 0.97 mg/dL (ref 0.57–1.00)
Globulin, Total: 3 g/dL (ref 1.5–4.5)
Glucose: 79 mg/dL (ref 70–99)
Potassium: 4.3 mmol/L (ref 3.5–5.2)
Sodium: 137 mmol/L (ref 134–144)
Total Protein: 7.5 g/dL (ref 6.0–8.5)
eGFR: 73 mL/min/{1.73_m2} (ref >59)

## 2022-11-07 LAB — LIPID PANEL
Chol/HDL Ratio: 5.1 ratio — ABNORMAL HIGH (ref 0.0–4.4)
Cholesterol, Total: 163 mg/dL (ref 100–199)
HDL: 32 mg/dL — ABNORMAL LOW (ref >39)
LDL Chol Calc (NIH): 93 mg/dL (ref 0–99)
Triglycerides: 225 mg/dL — ABNORMAL HIGH (ref 0–149)
VLDL Cholesterol Cal: 38 mg/dL (ref 5–40)

## 2022-11-07 LAB — CBC WITH DIFFERENTIAL/PLATELET
Basophils Absolute: 0.1 10*3/uL (ref 0.0–0.2)
Basos: 1 %
EOS (ABSOLUTE): 0.2 10*3/uL (ref 0.0–0.4)
Eos: 2 %
Hematocrit: 47 % — ABNORMAL HIGH (ref 34.0–46.6)
Hemoglobin: 16.4 g/dL — ABNORMAL HIGH (ref 11.1–15.9)
Immature Grans (Abs): 0 10*3/uL (ref 0.0–0.1)
Immature Granulocytes: 0 %
Lymphocytes Absolute: 3.5 10*3/uL — ABNORMAL HIGH (ref 0.7–3.1)
Lymphs: 31 %
MCH: 33.7 pg — ABNORMAL HIGH (ref 26.6–33.0)
MCHC: 34.9 g/dL (ref 31.5–35.7)
MCV: 97 fL (ref 79–97)
Monocytes Absolute: 0.8 10*3/uL (ref 0.1–0.9)
Monocytes: 7 %
Neutrophils Absolute: 6.6 10*3/uL (ref 1.4–7.0)
Neutrophils: 59 %
Platelets: 295 10*3/uL (ref 150–450)
RBC: 4.86 x10E6/uL (ref 3.77–5.28)
RDW: 12.5 % (ref 11.7–15.4)
WBC: 11.2 10*3/uL — ABNORMAL HIGH (ref 3.4–10.8)

## 2022-11-07 LAB — HEPATITIS C ANTIBODY: Hep C Virus Ab: NONREACTIVE

## 2022-11-07 LAB — TSH+FREE T4
Free T4: 1.35 ng/dL (ref 0.82–1.77)
TSH: 2.2 u[IU]/mL (ref 0.450–4.500)

## 2022-11-07 LAB — HEMOGLOBIN A1C
Est. average glucose Bld gHb Est-mCnc: 111 mg/dL
Hgb A1c MFr Bld: 5.5 % (ref 4.8–5.6)

## 2022-11-07 LAB — HIV ANTIBODY (ROUTINE TESTING W REFLEX): HIV Screen 4th Generation wRfx: NONREACTIVE

## 2022-11-07 LAB — MICROALBUMIN / CREATININE URINE RATIO
Creatinine, Urine: 143.4 mg/dL
Microalb/Creat Ratio: 25 mg/g creat (ref 0–29)
Microalbumin, Urine: 35.9 ug/mL

## 2022-11-07 LAB — VITAMIN D 25 HYDROXY (VIT D DEFICIENCY, FRACTURES): Vit D, 25-Hydroxy: 23.3 ng/mL — ABNORMAL LOW (ref 30.0–100.0)

## 2022-11-19 ENCOUNTER — Encounter (INDEPENDENT_AMBULATORY_CARE_PROVIDER_SITE_OTHER): Payer: Self-pay

## 2022-11-20 ENCOUNTER — Other Ambulatory Visit: Payer: Self-pay | Admitting: Family Medicine

## 2022-11-20 DIAGNOSIS — N3281 Overactive bladder: Secondary | ICD-10-CM

## 2022-11-20 MED ORDER — OXYBUTYNIN CHLORIDE ER 10 MG PO TB24: 10.0000 mg | ORAL_TABLET | Freq: Every day | ORAL | 3 refills | Status: DC

## 2022-11-24 ENCOUNTER — Other Ambulatory Visit (HOSPITAL_COMMUNITY)
Admission: RE | Admit: 2022-11-24 | Discharge: 2022-11-24 | Disposition: A | Discharge status: Home / Self Care | Payer: Medicaid Other | Source: Ambulatory Visit | Attending: Gastroenterology | Admitting: Gastroenterology

## 2022-11-24 ENCOUNTER — Other Ambulatory Visit (INDEPENDENT_AMBULATORY_CARE_PROVIDER_SITE_OTHER): Payer: Self-pay | Admitting: Gastroenterology

## 2022-11-24 DIAGNOSIS — R634 Abnormal weight loss: Secondary | ICD-10-CM

## 2022-11-24 LAB — PREGNANCY, URINE: Preg Test, Ur: NEGATIVE

## 2022-11-26 ENCOUNTER — Encounter (HOSPITAL_COMMUNITY): Payer: Self-pay | Admitting: Gastroenterology

## 2022-11-26 ENCOUNTER — Ambulatory Visit (HOSPITAL_COMMUNITY): Payer: Medicaid Other | Admitting: Anesthesiology

## 2022-11-26 ENCOUNTER — Ambulatory Visit (HOSPITAL_BASED_OUTPATIENT_CLINIC_OR_DEPARTMENT_OTHER): Payer: Medicaid Other | Admitting: Anesthesiology

## 2022-11-26 ENCOUNTER — Encounter (HOSPITAL_COMMUNITY)
Admission: RE | Disposition: A | Discharge status: Home / Self Care | Payer: Self-pay | Source: Home / Self Care | Attending: Gastroenterology

## 2022-11-26 ENCOUNTER — Ambulatory Visit (HOSPITAL_COMMUNITY)
Admission: RE | Admit: 2022-11-26 | Discharge: 2022-11-26 | Disposition: A | Discharge status: Home / Self Care | Payer: Medicaid Other | Attending: Gastroenterology | Admitting: Gastroenterology

## 2022-11-26 ENCOUNTER — Other Ambulatory Visit: Payer: Self-pay

## 2022-11-26 DIAGNOSIS — K529 Noninfective gastroenteritis and colitis, unspecified: Secondary | ICD-10-CM | POA: Insufficient documentation

## 2022-11-26 DIAGNOSIS — R1010 Upper abdominal pain, unspecified: Secondary | ICD-10-CM

## 2022-11-26 DIAGNOSIS — K319 Disease of stomach and duodenum, unspecified: Secondary | ICD-10-CM | POA: Diagnosis not present

## 2022-11-26 DIAGNOSIS — K449 Diaphragmatic hernia without obstruction or gangrene: Secondary | ICD-10-CM | POA: Insufficient documentation

## 2022-11-26 DIAGNOSIS — F419 Anxiety disorder, unspecified: Secondary | ICD-10-CM | POA: Diagnosis not present

## 2022-11-26 DIAGNOSIS — Z79899 Other long term (current) drug therapy: Secondary | ICD-10-CM | POA: Diagnosis not present

## 2022-11-26 DIAGNOSIS — R110 Nausea: Secondary | ICD-10-CM | POA: Diagnosis not present

## 2022-11-26 DIAGNOSIS — R1013 Epigastric pain: Secondary | ICD-10-CM | POA: Diagnosis not present

## 2022-11-26 DIAGNOSIS — K219 Gastro-esophageal reflux disease without esophagitis: Secondary | ICD-10-CM | POA: Insufficient documentation

## 2022-11-26 DIAGNOSIS — I10 Essential (primary) hypertension: Secondary | ICD-10-CM | POA: Insufficient documentation

## 2022-11-26 DIAGNOSIS — K3189 Other diseases of stomach and duodenum: Secondary | ICD-10-CM | POA: Diagnosis not present

## 2022-11-26 DIAGNOSIS — F17210 Nicotine dependence, cigarettes, uncomplicated: Secondary | ICD-10-CM

## 2022-11-26 DIAGNOSIS — R112 Nausea with vomiting, unspecified: Secondary | ICD-10-CM

## 2022-11-26 DIAGNOSIS — J449 Chronic obstructive pulmonary disease, unspecified: Secondary | ICD-10-CM | POA: Diagnosis not present

## 2022-11-26 DIAGNOSIS — F418 Other specified anxiety disorders: Secondary | ICD-10-CM

## 2022-11-26 DIAGNOSIS — F32A Depression, unspecified: Secondary | ICD-10-CM | POA: Insufficient documentation

## 2022-11-26 DIAGNOSIS — K2950 Unspecified chronic gastritis without bleeding: Secondary | ICD-10-CM | POA: Diagnosis not present

## 2022-11-26 HISTORY — PX: ESOPHAGOGASTRODUODENOSCOPY (EGD) WITH PROPOFOL: SHX5813

## 2022-11-26 HISTORY — PX: BIOPSY: SHX5522

## 2022-11-26 SURGERY — ESOPHAGOGASTRODUODENOSCOPY (EGD) WITH PROPOFOL
Anesthesia: General

## 2022-11-26 MED ORDER — PROPOFOL 500 MG/50ML IV EMUL
INTRAVENOUS | Status: AC
  Filled 2022-11-26: qty 50

## 2022-11-26 MED ORDER — LACTATED RINGERS IV SOLN
INTRAVENOUS | Status: DC
  Administered 2022-11-26: 1000 mL via INTRAVENOUS

## 2022-11-26 MED ORDER — PROPOFOL 10 MG/ML IV BOLUS
INTRAVENOUS | Status: DC | PRN
  Administered 2022-11-26: 40 mg via INTRAVENOUS
  Administered 2022-11-26: 100 mg via INTRAVENOUS

## 2022-11-26 MED ORDER — DICYCLOMINE HCL 10 MG PO CAPS: 10.0000 mg | ORAL_CAPSULE | Freq: Two times a day (BID) | ORAL | 1 refills | Status: DC | PRN

## 2022-11-26 MED ORDER — LIDOCAINE HCL (PF) 2 % IJ SOLN
INTRAMUSCULAR | Status: AC
  Filled 2022-11-26: qty 5

## 2022-11-26 MED ORDER — PROPOFOL 500 MG/50ML IV EMUL
INTRAVENOUS | Status: DC | PRN
  Administered 2022-11-26: 150 ug/kg/min via INTRAVENOUS

## 2022-11-26 MED ORDER — LIDOCAINE HCL (CARDIAC) PF 100 MG/5ML IV SOSY
PREFILLED_SYRINGE | INTRAVENOUS | Status: DC | PRN
  Administered 2022-11-26: 60 mg via INTRAVENOUS

## 2022-11-26 MED ORDER — ONDANSETRON HCL 4 MG PO TABS: 4.0000 mg | ORAL_TABLET | Freq: Three times a day (TID) | ORAL | 1 refills | Status: DC | PRN

## 2022-11-26 NOTE — Anesthesia Preprocedure Evaluation (Addendum)
Anesthesia Evaluation  Patient identified by MRN, date of birth, ID band Patient awake    Reviewed: Allergy & Precautions, H&P , NPO status , Patient's Chart, lab work & pertinent test results, reviewed documented beta blocker date and time   Airway Mallampati: II  TM Distance: >3 FB Neck ROM: Full    Dental  (+) Lower Dentures, Upper Dentures   Pulmonary COPD (denied h/o COPD, uses inhaler, last use - month ago),  COPD inhaler, Current SmokerPatient did not abstain from smoking.   Pulmonary exam normal breath sounds clear to auscultation       Cardiovascular Exercise Tolerance: Good hypertension, Pt. on medications and Pt. on home beta blockers Normal cardiovascular exam Rhythm:Regular Rate:Normal     Neuro/Psych  PSYCHIATRIC DISORDERS Anxiety Depression    negative neurological ROS     GI/Hepatic Neg liver ROS,GERD  Medicated and Controlled,,  Endo/Other  negative endocrine ROS    Renal/GU negative Renal ROS  negative genitourinary   Musculoskeletal  (+) Arthritis , Osteoarthritis,    Abdominal   Peds negative pediatric ROS (+)  Hematology negative hematology ROS (+)   Anesthesia Other Findings   Reproductive/Obstetrics negative OB ROS                             Anesthesia Physical Anesthesia Plan  ASA: 2  Anesthesia Plan: General   Post-op Pain Management: Minimal or no pain anticipated   Induction: Intravenous  PONV Risk Score and Plan: Propofol infusion  Airway Management Planned: Natural Airway and Nasal Cannula  Additional Equipment:   Intra-op Plan:   Post-operative Plan:   Informed Consent: I have reviewed the patients History and Physical, chart, labs and discussed the procedure including the risks, benefits and alternatives for the proposed anesthesia with the patient or authorized representative who has indicated his/her understanding and acceptance.      Dental advisory given  Plan Discussed with: CRNA and Surgeon  Anesthesia Plan Comments:        Anesthesia Quick Evaluation

## 2022-11-26 NOTE — Transfer of Care (Signed)
Immediate Anesthesia Transfer of Care Note  Patient: [name]  Procedure(s) Performed: ESOPHAGOGASTRODUODENOSCOPY (EGD) WITH PROPOFOL BIOPSY  Patient Location: PACU  Anesthesia Type:General  Level of Consciousness: awake and patient cooperative  Airway & Oxygen Therapy: Patient Spontanous Breathing and Patient connected to nasal cannula oxygen  Post-op Assessment: Report given to RN and Post -op Vital signs reviewed and stable  Post vital signs: Reviewed and stable  Last Vitals:  Vitals Value Taken Time  BP 90/60 11/26/22 1302  Temp 36.6 C 11/26/22 1302  Pulse 75 11/26/22 1302  Resp 15 11/26/22 1302  SpO2 97 % 11/26/22 1302    Last Pain:  Vitals:   11/26/22 1302  TempSrc: Oral  PainSc: 0-No pain      Patients Stated Pain Goal: 9 (11/26/22 1146)  Complications: No notable events documented.

## 2022-11-26 NOTE — Anesthesia Postprocedure Evaluation (Signed)
Anesthesia Post Note  Patient: [name]  Procedure(s) Performed: ESOPHAGOGASTRODUODENOSCOPY (EGD) WITH PROPOFOL BIOPSY  Patient location during evaluation: Phase II Anesthesia Type: General Level of consciousness: awake and alert and oriented Pain management: pain level controlled Vital Signs Assessment: post-procedure vital signs reviewed and stable Respiratory status: spontaneous breathing, nonlabored ventilation and respiratory function stable Cardiovascular status: blood pressure returned to baseline and stable Postop Assessment: no apparent nausea or vomiting Anesthetic complications: no  No notable events documented.   Last Vitals:  Vitals:   11/26/22 1146 11/26/22 1302  BP: 122/81 90/60  Pulse: 73 75  Resp: 12 15  Temp: 36.6 C 36.6 C  SpO2: 95% 97%    Last Pain:  Vitals:   11/26/22 1302  TempSrc: Oral  PainSc: 0-No pain                 Issacc Merlo C Londynn Sonoda

## 2022-11-26 NOTE — Op Note (Signed)
[facility] Patient Name: [name] Procedure Date: 11/26/2022 12:34 PM MRN: [id number] Date of Birth: [birth year] Attending MD: Katrinka Blazing , , [id number] CSN: [id number] Age: 47 Admit Type: Outpatient Procedure:                Upper GI endoscopy Indications:              Epigastric abdominal pain, Nausea Providers:                Katrinka Blazing, Crystal Page, Lennice Sites                            Technician, Technician Referring MD:              Medicines:                Monitored Anesthesia Care Complications:            No immediate complications. Estimated Blood Loss:     Estimated blood loss: none. Procedure:                Pre-Anesthesia Assessment:                           - Prior to the procedure, a History and Physical                            was performed, and patient medications, allergies                            and sensitivities were reviewed. The patient's                            tolerance of previous anesthesia was reviewed.                           - The risks and benefits of the procedure and the                            sedation options and risks were discussed with the                            patient. All questions were answered and informed                            consent was obtained.                           - ASA Grade Assessment: II - A patient with mild                            systemic disease.                           After obtaining informed consent, the endoscope was                            passed under direct vision. Throughout the  procedure, the patient's blood pressure, pulse, and                            oxygen saturations were monitored continuously. The                            [id number]-H190 ([id number]) scope was introduced through the                            mouth, and advanced to the second part of duodenum.                            The upper GI endoscopy was accomplished  without                            difficulty. The patient tolerated the procedure                            well. Scope In: 12:47:58 PM Scope Out: 12:55:48 PM Total Procedure Duration: 0 hours 7 minutes 50 seconds  Findings:      A 2 cm hiatal hernia was present.      The entire examined stomach was normal. Biopsies were taken with a cold       forceps for Helicobacter pylori testing.      The examined duodenum was normal. Biopsies were taken with a cold       forceps for histology. Impression:               - 2 cm hiatal hernia.                           - Normal stomach. Biopsied.                           - Normal examined duodenum. Biopsied. Moderate Sedation:      Per Anesthesia Care Recommendation:           - Discharge patient to home (ambulatory).                           - Resume previous diet.                           - Await pathology results.                           - Continue present medications.                           - If negative biopsies, proceed with H. pylori                            stool testing (kit to be collected at the GI office)                           - Start Zofran as needed for nausea.                           -  Take Bentyl as needed for abdominal pain. Procedure Code(s):        --- Professional ---                           [phone], Esophagogastroduodenoscopy, flexible,                            transoral; with biopsy, single or multiple Diagnosis Code(s):        --- Professional ---                           K44.9, Diaphragmatic hernia without obstruction or                            gangrene                           R10.13, Epigastric pain                           R11.0, Nausea CPT copyright 2022 [employer]. All rights reserved. The codes documented in this report are preliminary and upon coder review may  be revised to meet current compliance requirements. Katrinka Blazing, MD Katrinka Blazing,  11/26/2022 1:02:04  PM This report has been signed electronically. Number of Addenda: 0

## 2022-11-26 NOTE — Interval H&P Note (Signed)
History and Physical Interval Note:  11/26/2022 12:03 PM  [name]  has presented today for surgery, with the diagnosis of N/V, UPPER ABD PAIN.  The various methods of treatment have been discussed with the patient and family. After consideration of risks, benefits and other options for treatment, the patient has consented to  Procedure(s) with comments: ESOPHAGOGASTRODUODENOSCOPY (EGD) WITH PROPOFOL (N/A) - 1:00PM;asa 2 as a surgical intervention.  The patient's history has been reviewed, patient examined, no change in status, stable for surgery.  I have reviewed the patient's chart and labs.  Questions were answered to the patient's satisfaction.     [name]

## 2022-11-26 NOTE — Discharge Instructions (Addendum)
You are being discharged to home.  Resume your previous diet.  We are waiting for your pathology results.  Continue your present medications.  If negative biopsies, proceed with H. pylori stool testing (kit to be collected at the GI office) Start Zofran as needed for nausea. Take Bentyl as needed for abdominal pain.

## 2022-11-28 LAB — SURGICAL PATHOLOGY

## 2022-12-01 ENCOUNTER — Encounter (HOSPITAL_COMMUNITY): Payer: Self-pay | Admitting: Gastroenterology

## 2022-12-03 ENCOUNTER — Ambulatory Visit: Payer: Medicaid Other | Admitting: Family Medicine

## 2022-12-10 ENCOUNTER — Ambulatory Visit: Payer: Medicaid Other | Admitting: Family Medicine

## 2023-01-18 IMAGING — CT CT HEAD W/O CM
4 series · 16 of 47 positions shown, 18 images · non-contrast
Comparison: None.

CLINICAL DATA: Dizziness, non-specific



[Series 2: head w o · axial · 0.42mm/px · z∈[-33,+77]mm · 7 of 30 slices shown, 9 images]
[im 4/30  brain]
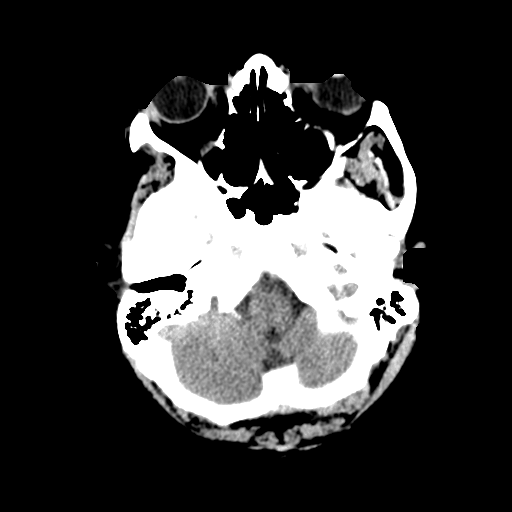
[im 4/30  bone]
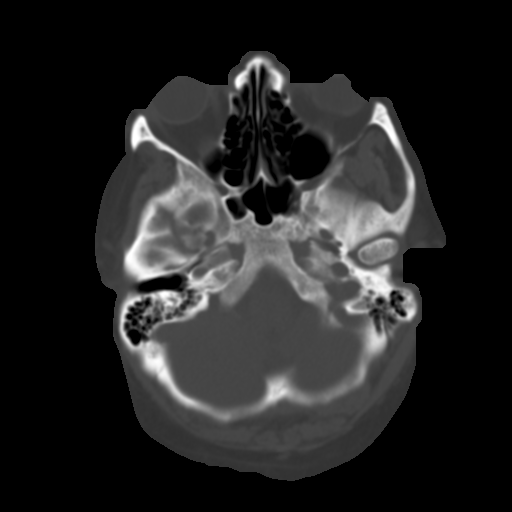
[im 8/30  brain]
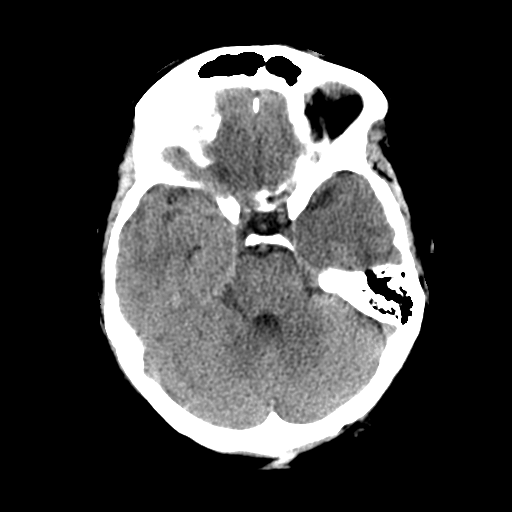
[im 11/30  brain]
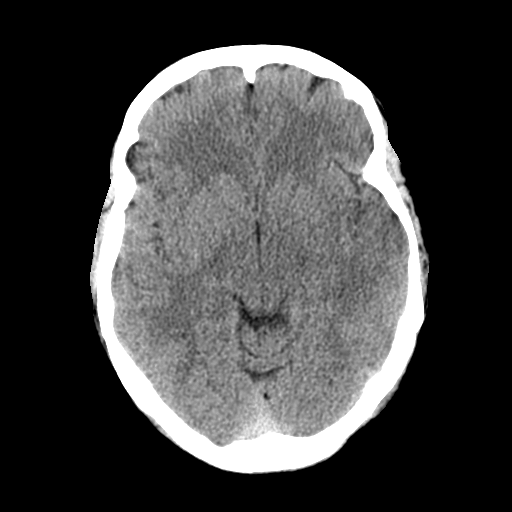
[im 15/30  brain]
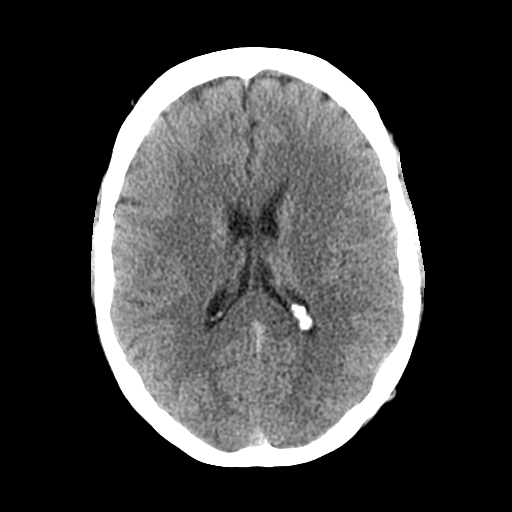
[im 19/30  brain]
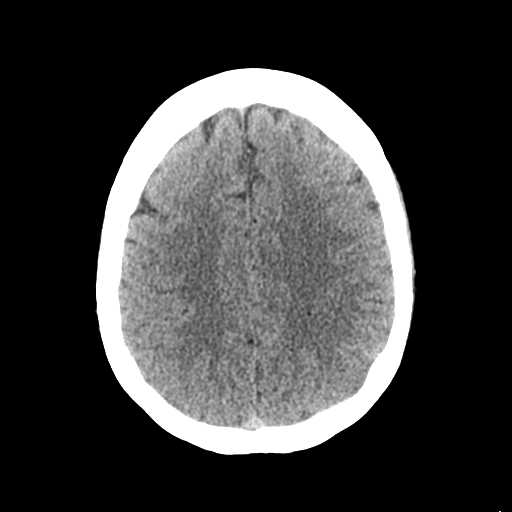
[im 19/30  bone]
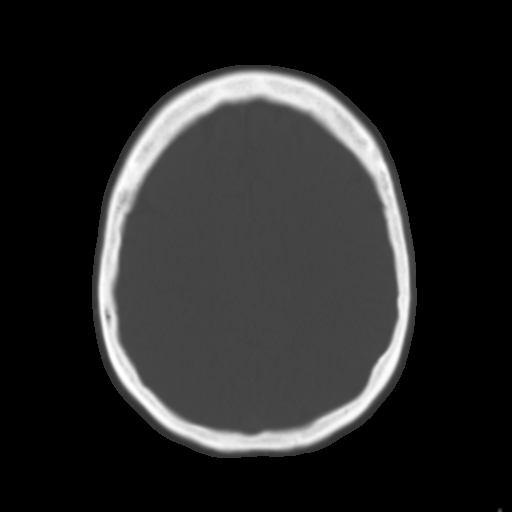
[im 22/30  brain]
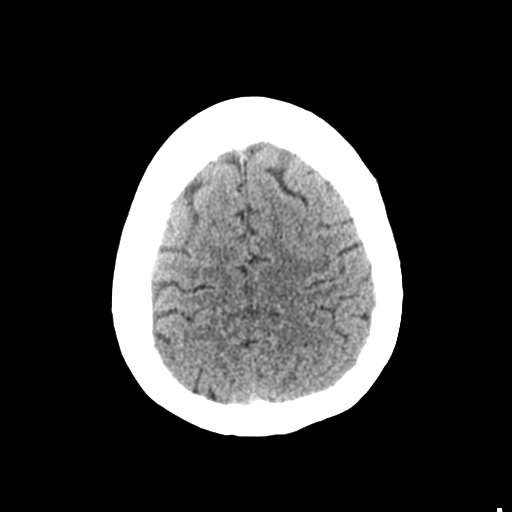
[im 26/30  brain]
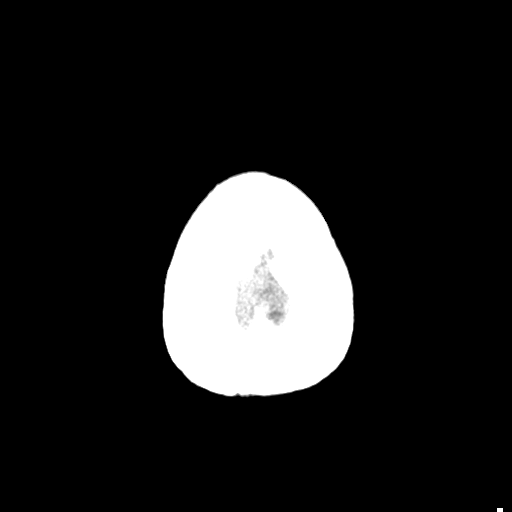

[Series 3: head bone · axial · 0.42mm/px · z∈[-34,-6]mm · 3 of 74 slices shown]
[im 8/74  bone]
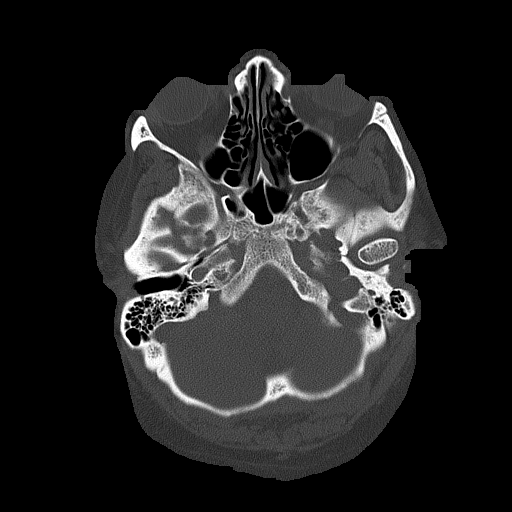
[im 15/74  bone]
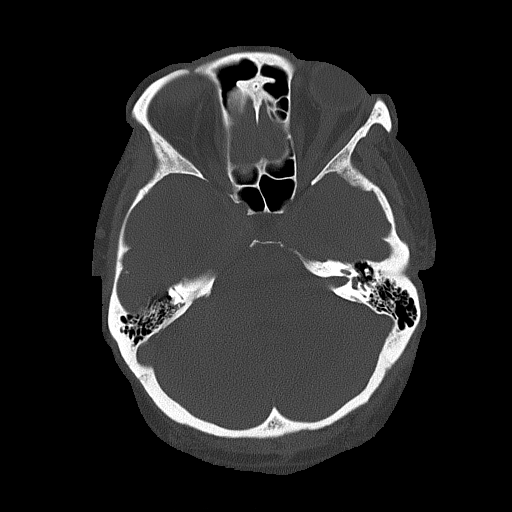
[im 22/74  bone]
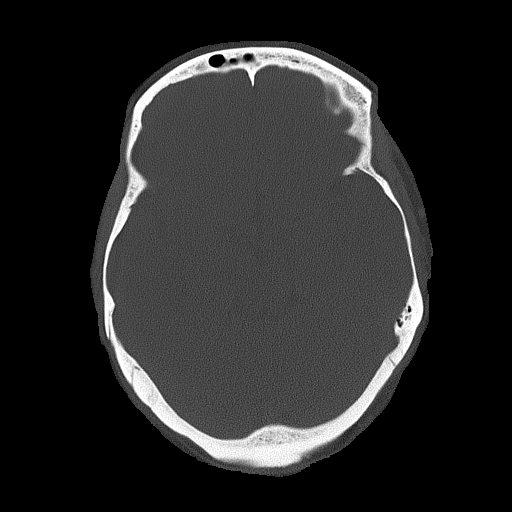

[Series 4: coronal soft · coronal · 0.32mm/px · 3 of 66 slices shown]
[im 22/66  brain]
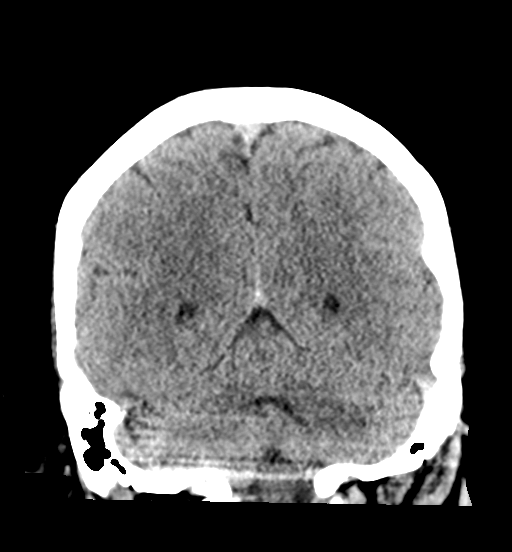
[im 29/66  brain]
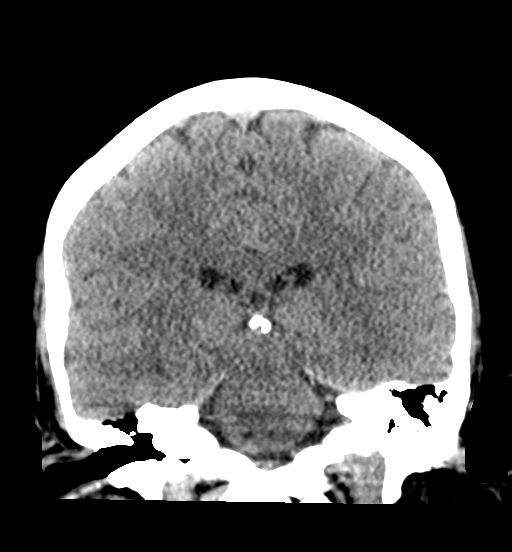
[im 37/66  brain]
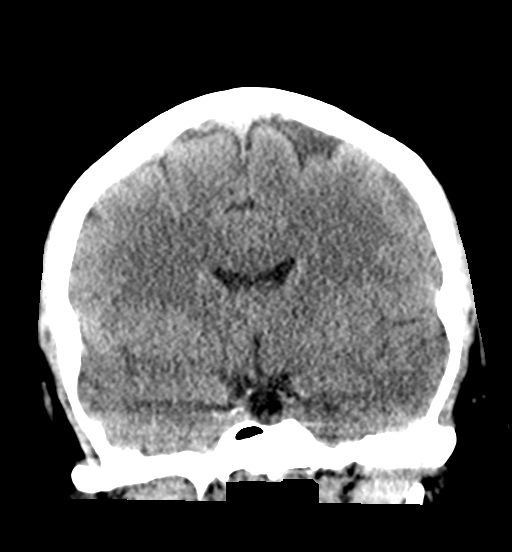

[Series 6: sagittal soft · sagittal · 0.32mm/px · 3 of 53 slices shown]
[im 18/53  brain]
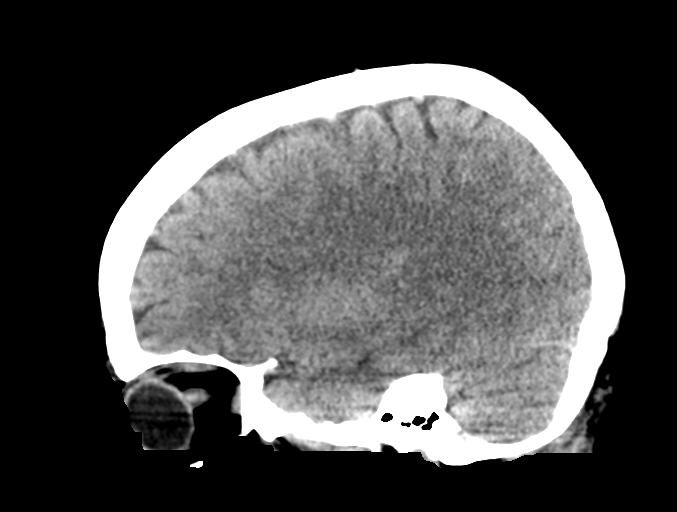
[im 27/53  brain]
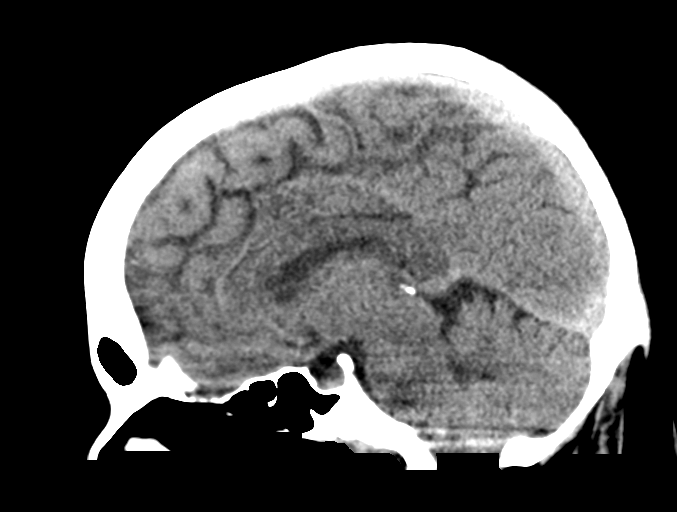
[im 35/53  brain]
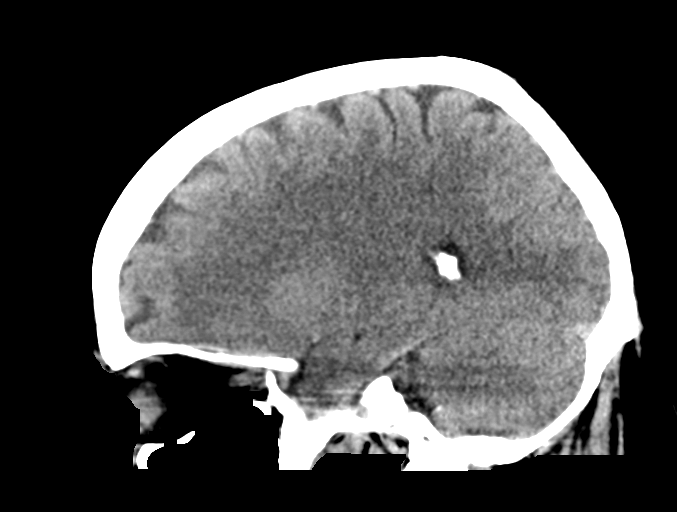

[16 of 47 positions shown; findings below may reference images not displayed]

FINDINGS: Brain:

No evidence of large-territorial acute infarction. No parenchymal
hemorrhage. No mass lesion. No extra-axial collection.

No mass effect or midline shift. No hydrocephalus. Basilar cisterns
are patent.

Vascular: No hyperdense vessel.

Skull: No acute fracture or focal lesion.

Sinuses/Orbits: Paranasal sinuses and mastoid air cells are clear.
The orbits are unremarkable.

Other: None.
IMPRESSION: No acute intracranial abnormality.

## 2023-01-28 ENCOUNTER — Other Ambulatory Visit: Payer: Self-pay | Admitting: Family Medicine

## 2023-01-28 ENCOUNTER — Other Ambulatory Visit (HOSPITAL_COMMUNITY)
Admission: RE | Admit: 2023-01-28 | Discharge: 2023-01-28 | Disposition: A | Discharge status: Home / Self Care | Payer: Medicaid Other | Source: Ambulatory Visit | Attending: Family Medicine | Admitting: Family Medicine

## 2023-01-28 ENCOUNTER — Encounter: Payer: Self-pay | Admitting: Family Medicine

## 2023-01-28 ENCOUNTER — Ambulatory Visit: Payer: Medicaid Other | Admitting: Family Medicine

## 2023-01-28 VITALS — BP 122/88 | HR 68 | Ht 63.0 in | Wt 212.0 lb

## 2023-01-28 DIAGNOSIS — Z1231 Encounter for screening mammogram for malignant neoplasm of breast: Secondary | ICD-10-CM | POA: Diagnosis not present

## 2023-01-28 DIAGNOSIS — Z124 Encounter for screening for malignant neoplasm of cervix: Secondary | ICD-10-CM | POA: Insufficient documentation

## 2023-01-28 MED ORDER — OXYBUTYNIN CHLORIDE ER 15 MG PO TB24: 15.0000 mg | ORAL_TABLET | Freq: Every day | ORAL | 3 refills | Status: DC

## 2023-01-28 NOTE — Assessment & Plan Note (Signed)
Pap smear done, Patient tolerated procedure well. Informed patient I will keep them updated on results. Right breast normal without mass, skin or nipple changes or axillary nodes, left breast normal without mass, skin or nipple changes or axillary nodes. 

## 2023-01-28 NOTE — Patient Instructions (Signed)

## 2023-01-28 NOTE — Progress Notes (Signed)
Patient Office Visit   Subjective   Patient ID: [name], female    DOB: [birth year]  Age: 47 y.o. MRN: [id number]  CC:  Chief Complaint  Patient presents with   Annual Exam    HPI [name] 47 year old female, presents to the clinic for pap smear. She  has a past medical history of Anxiety, Depression, GERD (gastroesophageal reflux disease), and Hypertension.For the details of today's visit, please refer to assessment and plan.   HPI    Outpatient Encounter Medications as of 01/28/2023  Medication Sig   albuterol (VENTOLIN HFA) 108 (90 Base) MCG/ACT inhaler Inhale 2 puffs into the lungs every 6 (six) hours as needed for wheezing or shortness of breath.   amLODipine (NORVASC) 10 MG tablet Take 1 tablet (10 mg total) by mouth daily.   aspirin EC 81 MG tablet Take 81 mg by mouth daily as needed for mild pain.   Cholecalciferol (VITAMIN D) 125 MCG (5000 UT) CAPS Take 5,000 Units by mouth daily.   dicyclomine (BENTYL) 10 MG capsule Take 1 capsule (10 mg total) by mouth every 12 (twelve) hours as needed for spasms (abdominal pain).   meclizine (ANTIVERT) 25 MG tablet Take 1 tablet (25 mg total) by mouth 3 (three) times daily as needed for dizziness.   metoprolol tartrate (LOPRESSOR) 50 MG tablet Take 1 tablet (50 mg total) by mouth 2 (two) times daily.   Omega-3 Fatty Acids (FISH OIL) 1000 MG CAPS Take 2,000 mg by mouth in the morning and at bedtime.   omeprazole (PRILOSEC) 40 MG capsule Take 1 capsule (40 mg total) by mouth daily.   ondansetron (ZOFRAN) 4 MG tablet Take 1 tablet (4 mg total) by mouth every 8 (eight) hours as needed for nausea or vomiting.   oxybutynin (DITROPAN-XL) 10 MG 24 hr tablet Take 1 tablet (10 mg total) by mouth at bedtime.   vitamin B-12 (CYANOCOBALAMIN) 1000 MCG tablet Take 1 tablet (1,000 mcg total) by mouth daily.   No facility-administered encounter medications on file as of 01/28/2023.    Past Surgical History:  Procedure Laterality  Date   BIOPSY  05/22/2022   Procedure: BIOPSY;  Surgeon: Dolores Frame, MD;  Location: [facility];  Service: Gastroenterology;;   BIOPSY  11/26/2022   Procedure: BIOPSY;  Surgeon: Dolores Frame, MD;  Location: [facility];  Service: Gastroenterology;;   CHOLECYSTECTOMY     COLONOSCOPY WITH PROPOFOL N/A 05/22/2022   Procedure: COLONOSCOPY WITH PROPOFOL;  Surgeon: Dolores Frame, MD;  Location: [facility];  Service: Gastroenterology;  Laterality: N/A;  1215 ASA 2   ESOPHAGOGASTRODUODENOSCOPY (EGD) WITH PROPOFOL N/A 11/26/2022   Procedure: ESOPHAGOGASTRODUODENOSCOPY (EGD) WITH PROPOFOL;  Surgeon: Dolores Frame, MD;  Location: [facility];  Service: Gastroenterology;  Laterality: N/A;  1:00PM;asa 2   POLYPECTOMY  05/22/2022   Procedure: POLYPECTOMY;  Surgeon: Dolores Frame, MD;  Location: [facility];  Service: Gastroenterology;;   TUBAL LIGATION      Review of Systems  Constitutional:  Negative for chills and fever.  Respiratory:  Negative for shortness of breath.   Cardiovascular:  Negative for chest pain.  Gastrointestinal:  Negative for abdominal pain.  Genitourinary:  Negative for dysuria, flank pain, frequency, hematuria and urgency.  Neurological:  Negative for dizziness and headaches.      Objective    BP 122/88   Pulse 68   Ht 5\' 3"  (1.6 m)   Wt 212 lb (96.2 kg)  SpO2 94%   BMI 37.55 kg/m   Physical Exam Vitals reviewed.  Constitutional:      General: She is not in acute distress.    Appearance: Normal appearance. She is not ill-appearing, toxic-appearing or diaphoretic.  HENT:     Head: Normocephalic.  Eyes:     General:        Right eye: No discharge.        Left eye: No discharge.     Conjunctiva/sclera: Conjunctivae normal.  Cardiovascular:     Rate and Rhythm: Normal rate.     Pulses: Normal pulses.     Heart sounds: Normal heart sounds.  Pulmonary:     Effort: Pulmonary effort is normal.  No respiratory distress.     Breath sounds: Normal breath sounds.  Genitourinary:    General: Normal vulva.     Exam position: Lithotomy position.     Pubic Area: No rash.      Labia:        Right: No rash.        Left: No rash or tenderness.      Vagina: Normal. No signs of injury.     Cervix: Normal.  Musculoskeletal:        General: Normal range of motion.     Cervical back: Normal range of motion.  Skin:    General: Skin is warm and dry.     Capillary Refill: Capillary refill takes less than 2 seconds.  Neurological:     General: No focal deficit present.     Mental Status: She is alert and oriented to person, place, and time.     Coordination: Coordination normal.     Gait: Gait normal.  Psychiatric:        Mood and Affect: Mood normal.        Behavior: Behavior normal.       Assessment & Plan:  Cervical cancer screening Assessment & Plan: Pap smear done, Patient tolerated procedure well. Informed patient I will keep them updated on results. Right breast normal without mass, skin or nipple changes or axillary nodes, left breast normal without mass, skin or nipple changes or axillary nodes.   Encounter for screening mammogram for breast cancer    Return in about 3 months (around 04/30/2023) for hypertension, routine labs.   Cruzita Lederer Newman Nip, FNP

## 2023-01-29 ENCOUNTER — Telehealth: Payer: Self-pay | Admitting: Family Medicine

## 2023-01-29 NOTE — Telephone Encounter (Signed)
Noted  

## 2023-01-29 NOTE — Telephone Encounter (Signed)
[name] called  from [facility] call back # [phone] they received spectum and it has the incorrect name and DOB on the paperwork and on the bottle incorrect name and DOB, will be sending this back to our office.

## 2023-02-05 ENCOUNTER — Encounter (HOSPITAL_COMMUNITY): Payer: Self-pay

## 2023-02-05 ENCOUNTER — Other Ambulatory Visit (HOSPITAL_COMMUNITY): Payer: Self-pay | Admitting: Family Medicine

## 2023-02-05 ENCOUNTER — Ambulatory Visit (HOSPITAL_COMMUNITY)
Admission: RE | Admit: 2023-02-05 | Discharge: 2023-02-05 | Disposition: A | Discharge status: Home / Self Care | Payer: Medicaid Other | Source: Ambulatory Visit | Attending: Family Medicine | Admitting: Family Medicine

## 2023-02-05 ENCOUNTER — Other Ambulatory Visit: Payer: Self-pay | Admitting: Family Medicine

## 2023-02-05 DIAGNOSIS — R928 Other abnormal and inconclusive findings on diagnostic imaging of breast: Secondary | ICD-10-CM

## 2023-02-05 DIAGNOSIS — R85610 Atypical squamous cells of undetermined significance on cytologic smear of anus (ASC-US): Secondary | ICD-10-CM

## 2023-02-05 DIAGNOSIS — Z1231 Encounter for screening mammogram for malignant neoplasm of breast: Secondary | ICD-10-CM | POA: Insufficient documentation

## 2023-02-05 LAB — CYTOLOGY - PAP
Comment: NEGATIVE
Diagnosis: UNDETERMINED — AB
High risk HPV: NEGATIVE

## 2023-02-12 ENCOUNTER — Ambulatory Visit (HOSPITAL_COMMUNITY)
Admission: RE | Admit: 2023-02-12 | Discharge: 2023-02-12 | Disposition: A | Discharge status: Home / Self Care | Payer: Medicaid Other | Source: Ambulatory Visit | Attending: Family Medicine | Admitting: Family Medicine

## 2023-02-12 ENCOUNTER — Encounter (HOSPITAL_COMMUNITY): Payer: Self-pay

## 2023-02-12 DIAGNOSIS — R928 Other abnormal and inconclusive findings on diagnostic imaging of breast: Secondary | ICD-10-CM

## 2023-02-12 DIAGNOSIS — R92313 Mammographic fatty tissue density, bilateral breasts: Secondary | ICD-10-CM | POA: Diagnosis not present

## 2023-02-26 ENCOUNTER — Ambulatory Visit (INDEPENDENT_AMBULATORY_CARE_PROVIDER_SITE_OTHER): Payer: Medicaid Other | Admitting: Gastroenterology

## 2023-02-26 ENCOUNTER — Telehealth (INDEPENDENT_AMBULATORY_CARE_PROVIDER_SITE_OTHER): Payer: Self-pay | Admitting: Gastroenterology

## 2023-02-26 ENCOUNTER — Encounter (INDEPENDENT_AMBULATORY_CARE_PROVIDER_SITE_OTHER): Payer: Self-pay | Admitting: Gastroenterology

## 2023-02-26 VITALS — BP 115/75 | HR 62 | Temp 98.5°F | Ht 63.0 in | Wt 211.6 lb

## 2023-02-26 DIAGNOSIS — R112 Nausea with vomiting, unspecified: Secondary | ICD-10-CM

## 2023-02-26 DIAGNOSIS — R6881 Early satiety: Secondary | ICD-10-CM

## 2023-02-26 DIAGNOSIS — R197 Diarrhea, unspecified: Secondary | ICD-10-CM

## 2023-02-26 DIAGNOSIS — R1011 Right upper quadrant pain: Secondary | ICD-10-CM

## 2023-02-26 DIAGNOSIS — R1031 Right lower quadrant pain: Secondary | ICD-10-CM

## 2023-02-26 DIAGNOSIS — R140 Abdominal distension (gaseous): Secondary | ICD-10-CM

## 2023-02-26 DIAGNOSIS — K219 Gastro-esophageal reflux disease without esophagitis: Secondary | ICD-10-CM | POA: Diagnosis not present

## 2023-02-26 MED ORDER — ONDANSETRON HCL 4 MG PO TABS: 4.0000 mg | ORAL_TABLET | Freq: Three times a day (TID) | ORAL | 1 refills | Status: DC | PRN

## 2023-02-26 NOTE — Patient Instructions (Signed)
Please continue with omeprazole 40mg  daily for acid reflux and zofran 4mg  every 8 hours as needed for nausea/vomiting We will get stool testing to rule out infection causing diarrhea as well as CT of your abdomen to ensure no recurrence of colitis As discussed, if no findings to explain reason for nausea, I would recommend we do a gastric emptying study to rule out delayed digestion.  Follow up 3 months  It was a pleasure to see you today. I want to create trusting relationships with patients and provide genuine, compassionate, and quality care. I truly value your feedback! please be on the lookout for a survey regarding your visit with me today. I appreciate your input about our visit and your time in completing this!    Deshea Pooley L. Jeanmarie Hubert, MSN, APRN, AGNP-C Adult-Gerontology Nurse Practitioner [facility]

## 2023-02-26 NOTE — Telephone Encounter (Signed)
PA pending via [employer].

## 2023-02-26 NOTE — Progress Notes (Signed)
Referring Provider: Health, Matthew Folks* Primary Care Physician:  Rica Records, FNP Primary GI Physician: Dr. Levon Hedger   Chief Complaint  Patient presents with   Nausea    Follow up on nausea, vomiting and abdominal pain. States she is still having tenderness in lower abdomen that is about the same as last visit. Nausea about the same but not vomiting as much since taking zofran. Takes it every morning and sometimes later in the day if needed.    Diarrhea    Started having diarrhea about one and a half weeks ago. Happens after eating. Has about 4 episodes per day. Stool is watery. Not tried any treatments.    HPI:   [name] is a 47 y.o. female with past medical history of anxiety, depression, HTN   Patient presenting today for follow up of nausea, abdominal pain, diarrhea and GERD   Last seen April 2024, at that time presenting for follow up of colitis, at that time having soft stools, 3-4 BMs per day and nausea. Severe abdominal pain at times. Having early satiety, feeling bloated  Recommended to start omeprazole 40mg  daily, schedule EGD.  Patient requested to perform H pylori in office stool testing though we were never able to reach her regarding this recommendation.   Present:  Continued bloating, some mid RUQ pain. She notes she feels full early and usually not able to finish a meal. She is still having some nausea, taking zofran and notes she has to take this first thing in the morning so that she can eat, otherwise she will vomit as eating makes the nausea worse. She is using dicyclomine which helps some.   She notes diarrhea began about 1.5 weeks ago, having up to 4 BMs per day now. Previously having maybe one per day. Notes eating tends to elicit her diarrhea episodes. No rectal bleeding or melena. She notes that stools appear stringy and with small pieces of stool. No recent antibiotics, changes in medications, recent sick contacts or travel  anywhere. She is having some intermittent, severe lower abdominal pain. She notes pain can be almost debilitating and she will have to get in bed. The pain occurs very infrequent, maybe once every few months. Weight is stable. Appetite is not great, she does not feel hungry often. No real issues with constipation in the past other than one episode years ago. No recent fevers or chills.   GERD managed well with omeprazole. No breakthrough  Last Colonoscopy: 05/2022 - Two 3 to 5 mm polyps in the descending colon and                            in the ascending colon, removed with a cold snare.                            Resected and retrieved.                           - The rest of the examined colon is normal.                            Biopsied-normal random biopsies                           - Non-bleeding internal  hemorrhoids. ( 1 TA, 1 hyperplastic polyp, random biopsies normal)   Last Endoscopy:-11/2022 2 cm hiatal hernia.                           - Normal stomach. Biopsied.                           - Normal examined duodenum. Biopsied-normal, no celiac or h pylori  Recommendations:    Past Medical History:  Diagnosis Date   Anxiety    [employer] 12/30/2019   Depression    GERD (gastroesophageal reflux disease)    Hypertension     Past Surgical History:  Procedure Laterality Date   BIOPSY  05/22/2022   Procedure: BIOPSY;  Surgeon: Dolores Frame, MD;  [facility];  Service: Gastroenterology;;   BIOPSY  11/26/2022   Procedure: BIOPSY;  Surgeon: Dolores Frame, MD;  [facility];  Service: Gastroenterology;;   CHOLECYSTECTOMY     COLONOSCOPY WITH PROPOFOL N/A 05/22/2022   Procedure: COLONOSCOPY WITH PROPOFOL;  Surgeon: Dolores Frame, MD;  [facility];  Service: Gastroenterology;  Laterality: N/A;  1215 ASA 2   ESOPHAGOGASTRODUODENOSCOPY (EGD) WITH PROPOFOL N/A 11/26/2022   Procedure: ESOPHAGOGASTRODUODENOSCOPY  (EGD) WITH PROPOFOL;  Surgeon: Dolores Frame, MD;  [facility];  Service: Gastroenterology;  Laterality: N/A;  1:00PM;asa 2   POLYPECTOMY  05/22/2022   Procedure: POLYPECTOMY;  Surgeon: Marguerita Merles, Reuel Boom, MD;  [facility];  Service: Gastroenterology;;   TUBAL LIGATION      Current Outpatient Medications  Medication Sig Dispense Refill   albuterol (VENTOLIN HFA) 108 (90 Base) MCG/ACT inhaler Inhale 2 puffs into the lungs every 6 (six) hours as needed for wheezing or shortness of breath. 8 g 2   amLODipine (NORVASC) 10 MG tablet Take 1 tablet (10 mg total) by mouth daily. 90 tablet 1   aspirin EC 81 MG tablet Take 81 mg by mouth daily as needed for mild pain.     Cholecalciferol (VITAMIN D) 125 MCG (5000 UT) CAPS Take 5,000 Units by mouth daily.     dicyclomine (BENTYL) 10 MG capsule Take 1 capsule (10 mg total) by mouth every 12 (twelve) hours as needed for spasms (abdominal pain). 180 capsule 1   meclizine (ANTIVERT) 25 MG tablet Take 1 tablet (25 mg total) by mouth 3 (three) times daily as needed for dizziness. 30 tablet 0   metoprolol tartrate (LOPRESSOR) 50 MG tablet Take 1 tablet (50 mg total) by mouth 2 (two) times daily. 90 tablet 2   Omega-3 Fatty Acids (FISH OIL PO) Take 2,000 mg by mouth in the morning and at bedtime.     omeprazole (PRILOSEC) 40 MG capsule Take 1 capsule (40 mg total) by mouth daily. 60 capsule 2   ondansetron (ZOFRAN) 4 MG tablet Take 1 tablet (4 mg total) by mouth every 8 (eight) hours as needed for nausea or vomiting. 180 tablet 1   oxybutynin (DITROPAN XL) 15 MG 24 hr tablet Take 1 tablet (15 mg total) by mouth at bedtime. 30 tablet 3   vitamin B-12 (CYANOCOBALAMIN) 1000 MCG tablet Take 1 tablet (1,000 mcg total) by mouth daily. 60 tablet 1   No current facility-administered medications for this visit.    Allergies as of 02/26/2023 - Review Complete 02/26/2023  Allergen Reaction Noted   Other  09/13/2012    Family  History  Problem Relation Age of Onset   Hypertension Father    Hypercholesterolemia Father    COPD Mother    Emphysema Mother     Social History   Socioeconomic History   Marital status: Legally Separated    Spouse name: Not on file   Number of children: Not on file   Years of education: Not on file   Highest education level: GED or equivalent  Occupational History   Not on file  Tobacco Use   Smoking status: Every Day    Current packs/day: 1.50    Average packs/day: 1.5 packs/day for 34.0 years (51.0 ttl pk-yrs)    Types: Cigarettes    Passive exposure: Current   Smokeless tobacco: Never  Vaping Use   Vaping status: Never Used  Substance and Sexual Activity   Alcohol use: Not Currently   Drug use: No   Sexual activity: Yes    Birth control/protection: Surgical  Other Topics Concern   Not on file  Social History Narrative   Not on file   Social Determinants of Health   Financial Resource Strain: Medium Risk (01/28/2023)   Overall Financial Resource Strain (CARDIA)    Difficulty of Paying Living Expenses: Somewhat hard  Food Insecurity: Patient Declined (01/28/2023)   Hunger Vital Sign    Worried About Running Out of Food in the Last Year: Patient declined    Ran Out of Food in the Last Year: Patient declined  Transportation Needs: Unmet Transportation Needs (01/28/2023)   PRAPARE - Administrator, Civil Service (Medical): Yes    Lack of Transportation (Non-Medical): Yes  Physical Activity: Insufficiently Active (01/28/2023)   Exercise Vital Sign    Days of Exercise per Week: 3 days    Minutes of Exercise per Session: 10 min  Stress: Stress Concern Present (01/28/2023)   [employer] of Occupational Health - Occupational Stress Questionnaire    Feeling of Stress : Rather much  Social Connections: Unknown (01/28/2023)   Social Connection and Isolation Panel [NHANES]    Frequency of Communication with Friends and Family: Not on file    Frequency of  Social Gatherings with Friends and Family: Patient declined    Attends Religious Services: Never    Database administrator or Organizations: No    Attends Engineer, structural: Not on file    Marital Status: Separated   Review of systems General: negative for malaise, night sweats, fever, chills, weight loss Neck: Negative for lumps, goiter, pain and significant neck swelling Resp: Negative for cough, wheezing, dyspnea at rest CV: Negative for chest pain, leg swelling, palpitations, orthopnea GI: denies melena, hematochezia, nausea, vomiting, diarrhea, constipation, dysphagia, odyonophagia, early satiety or unintentional weight loss.  MSK: Negative for joint pain or swelling, back pain, and muscle pain. Derm: Negative for itching or rash Psych: Denies depression, anxiety, memory loss, confusion. No homicidal or suicidal ideation.  Heme: Negative for prolonged bleeding, bruising easily, and swollen nodes. Endocrine: Negative for cold or heat intolerance, polyuria, polydipsia and goiter. Neuro: negative for tremor, gait imbalance, syncope and seizures. The remainder of the review of systems is noncontributory.  Physical Exam: BP 115/75 (BP Location: Left Arm, Patient Position: Sitting, Cuff Size: Large)   Pulse 62   Temp 98.5 F (36.9 C) (Oral)   Ht 5\' 3"  (1.6 m)   Wt 211 lb 9.6 oz (96 kg)   LMP 01/01/2023   BMI 37.48 kg/m  General:   Alert and oriented. No distress noted. Pleasant and  cooperative.  Head:  Normocephalic and atraumatic. Eyes:  Conjuctiva clear without scleral icterus. Mouth:  Oral mucosa pink and moist. Good dentition. No lesions. Heart: Normal rate and rhythm, s1 and s2 heart sounds present.  Lungs: Clear lung sounds in all lobes. Respirations equal and unlabored. Abdomen:  +BS, soft, and non-distended. Right abdominal tenderness. No rebound or guarding. No HSM or masses noted. Derm: No palmar erythema or jaundice Msk:  Symmetrical without gross  deformities. Normal posture. Extremities:  Without edema. Neurologic:  Alert and  oriented x4 Psych:  Alert and cooperative. Normal mood and affect.  Invalid input(s): "6 MONTHS"   ASSESSMENT: SHARICKA LANGLIE is a 47 y.o. female presenting today for follow up of nausea, abdominal pain, GERD and diarrhea  GERD: well controlled on omeprazole with no breakthrough, dysphagia, odynophagia. Will continue with current PPI Regimen.   Abdominal pain/diarrhea: recurrent diarrhea starting 1.5 weeks ago, having up to 4 watery stools per day. No rectal bleeding or  melena. History of colitis in October 2023, colonoscopy thereafter in November 2023 with a few polyps, hemorrhoids and normal colonic biopsies. Denies any history of constipation. No weight loss, recent antibiotics or medication changes. Abdomen is tender on exam. Will proceed with stool studies to rule out infectious etiology and CT A/P to rule out recurrent colitis given her symptoms. Differentials include: infectious diarrhea, colitis, overflow diarrhea, IBS  Nausea/bloating/right abdominal pain: previous cholecystectomy. Ongoing right abdominal pain with some early satiety. Recent EGD as above in May with small hiatal hernia. She is taking zofran PRN usually atleast once daily. No history of DM or opiate use though query if this could be gastroparesis given recent EGD grossly normal. Will proceed with GES if CT A/p is unremarkable for cause of her UGI symptoms.    PLAN:  C diff, GI pathogen panel, O&p 2. CT A/P  with contrast  3.  Consider GES if no fidings on CT to explain nausea 4. Continue with omeprazole 40mg   5. Continue zofran 4mg  PRN for nausea and vomiting  All questions were answered, patient verbalized understanding and is in agreement with plan as outlined above.   Follow Up: 3 months   Nazario Russom L. Jeanmarie Hubert, MSN, APRN, AGNP-C Adult-Gerontology Nurse Practitioner [facility].  I have reviewed the  note and agree with the APP's assessment as described in this progress note  Katrinka Blazing, MD Gastroenterology and Hepatology [facility]

## 2023-03-01 DIAGNOSIS — R6881 Early satiety: Secondary | ICD-10-CM | POA: Insufficient documentation

## 2023-03-03 ENCOUNTER — Encounter (HOSPITAL_COMMUNITY): Payer: Self-pay

## 2023-03-03 ENCOUNTER — Ambulatory Visit (HOSPITAL_COMMUNITY): Payer: Medicaid Other

## 2023-03-03 NOTE — Telephone Encounter (Signed)
Pt left voicemail in regards to CT.  CT is still pending under insurance. Left message on pt voicemail to inform her of what is going on.

## 2023-03-05 DIAGNOSIS — R197 Diarrhea, unspecified: Secondary | ICD-10-CM | POA: Diagnosis not present

## 2023-03-10 LAB — OVA AND PARASITE EXAMINATION
CONCENTRATE RESULT:: NONE SEEN
MICRO NUMBER:: 15369107
SPECIMEN QUALITY:: ADEQUATE
TRICHROME RESULT:: NONE SEEN

## 2023-03-10 LAB — GASTROINTESTINAL PATHOGEN PNL
CampyloBacter Group: NOT DETECTED
Norovirus GI/GII: NOT DETECTED
Rotavirus A: NOT DETECTED
Salmonella species: NOT DETECTED
Shiga Toxin 1: NOT DETECTED
Shiga Toxin 2: NOT DETECTED
Shigella Species: NOT DETECTED
Vibrio Group: NOT DETECTED
Yersinia enterocolitica: NOT DETECTED

## 2023-03-10 LAB — C. DIFFICILE GDH AND TOXIN A/B
GDH ANTIGEN: DETECTED
MICRO NUMBER:: 15373201
SPECIMEN QUALITY:: ADEQUATE
TOXIN A AND B: NOT DETECTED

## 2023-03-10 LAB — CLOSTRIDIUM DIFFICILE TOXIN B, QUALITATIVE, REAL-TIME PCR: Toxigenic C. Difficile by PCR: NOT DETECTED

## 2023-03-13 ENCOUNTER — Encounter (INDEPENDENT_AMBULATORY_CARE_PROVIDER_SITE_OTHER): Payer: Self-pay

## 2023-04-01 ENCOUNTER — Ambulatory Visit: Payer: Medicaid Other | Admitting: Family Medicine

## 2023-04-01 ENCOUNTER — Encounter: Payer: Self-pay | Admitting: Family Medicine

## 2023-04-01 VITALS — BP 125/82 | HR 68 | Ht 63.0 in | Wt 209.0 lb

## 2023-04-01 DIAGNOSIS — E782 Mixed hyperlipidemia: Secondary | ICD-10-CM | POA: Diagnosis not present

## 2023-04-01 DIAGNOSIS — M542 Cervicalgia: Secondary | ICD-10-CM | POA: Insufficient documentation

## 2023-04-01 DIAGNOSIS — R0602 Shortness of breath: Secondary | ICD-10-CM

## 2023-04-01 DIAGNOSIS — I10 Essential (primary) hypertension: Secondary | ICD-10-CM | POA: Diagnosis not present

## 2023-04-01 DIAGNOSIS — N3281 Overactive bladder: Secondary | ICD-10-CM | POA: Diagnosis not present

## 2023-04-01 MED ORDER — AMLODIPINE BESYLATE 10 MG PO TABS: 10.0000 mg | ORAL_TABLET | Freq: Every day | ORAL | 1 refills | Status: DC

## 2023-04-01 MED ORDER — ALBUTEROL SULFATE HFA 108 (90 BASE) MCG/ACT IN AERS
2.0000 | INHALATION_SPRAY | Freq: Four times a day (QID) | RESPIRATORY_TRACT | 2 refills | Status: DC | PRN
Start: 2023-04-01 — End: 2024-04-19

## 2023-04-01 MED ORDER — METHOCARBAMOL 500 MG PO TABS
500.0000 mg | ORAL_TABLET | Freq: Two times a day (BID) | ORAL | 1 refills | Status: DC | PRN
Start: 2023-04-01 — End: 2023-08-05

## 2023-04-01 MED ORDER — METOPROLOL TARTRATE 50 MG PO TABS
50.0000 mg | ORAL_TABLET | Freq: Two times a day (BID) | ORAL | 2 refills | Status: DC
Start: 2023-04-01 — End: 2023-08-27
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
## 2023-04-01 NOTE — Patient Instructions (Signed)

## 2023-04-01 NOTE — Assessment & Plan Note (Signed)
Vitals:   04/01/23 0922  BP: 125/82  Blood pressure controlled in today's visit Labs ordered, Continue Amlodipine 10 mg once daily and Metoprolol 50 mg twice daily Continued discussion on DASH diet, low sodium diet and maintain a exercise routine for 150 minutes per week.

## 2023-04-01 NOTE — Assessment & Plan Note (Signed)
Urinalysis, urine culture ordered- Awaiting results will follow up. Patient reports taking oxybutynin 15 mg daily with no relief Patient denies any pain, or vaginal discharge Referral placed to urology for further evaluation.

## 2023-04-01 NOTE — Assessment & Plan Note (Addendum)
Neck Xray ordered awaiting results will follow up Started Robaxin 500 mg PRN for pain management Explained to patient Non pharmacological interventions include the use of ice or heat, rest, recommend range of motion exercises, gentle stretching. The use of NSAIDs for pain management.  Follow up for worsening or persistent symptoms visit ED. Patient verbalizes understanding regarding plan of care and all questions answered.

## 2023-04-02 LAB — URINALYSIS
Bilirubin, UA: NEGATIVE
Glucose, UA: NEGATIVE
Ketones, UA: NEGATIVE
Leukocytes,UA: NEGATIVE
Nitrite, UA: NEGATIVE
Protein,UA: NEGATIVE
Specific Gravity, UA: 1.01 (ref 1.005–1.030)
Urobilinogen, Ur: 0.2 mg/dL (ref 0.2–1.0)
pH, UA: 6.5 (ref 5.0–7.5)

## 2023-04-02 NOTE — Progress Notes (Signed)
Name: AUDIA FUNARO DOB: 1975/12/25 MRN: [id number]  History of Present Illness: Ms. Mauthe is a 47 y.o. female who presents today as a new patient at [facility]. All available relevant medical records have been reviewed.  - GU History: 1. OAB.  Today: She reports urinary frequency, nocturia, urgency, urge incontinence, and nocturnal enuresis. Voiding up to 20x/day and 1-2x/night on average. Leaking 7-8x/day on average; using 6 thin pads per day on average and 1 large thick pad overnight. - She has been taking Oxybutynin XL 15 mg daily with no improvement. Denies any other prior attempted treatments. - She reports caffeine intake (3 cups of coffee in the mornings; [facility]).  Also reports stress urinary incontinence with cough / laugh / sneeze. She reports the urge incontinence is predominant.  She reports history of obstructive sleep apnea; states CPAP was not advised at the time of her sleep study. She denies significant fluid intake within 3 hours prior to bedtime or overnight. She tries to limit caffeine intake within 8 hours prior to bedtime. She denies routinely experiencing lower extremity edema during the day.  She denies dysuria, gross hematuria, straining to void, or sensations of incomplete emptying.  Denies history of recent or recurrent UTIs. Denies history of kidney stones.  She denies history of GU malignancy or pelvic radiation.  She denies history of autoimmune disease. She reports history of smoking (has smoked 1-2 ppd x35 years). She reports taking anticoagulants (Aspirin 81 mg).   Fall Screening: Do you usually have a device to assist in your mobility? No   Medications: Current Outpatient Medications  Medication Sig Dispense Refill   albuterol (VENTOLIN HFA) 108 (90 Base) MCG/ACT inhaler Inhale 2 puffs into the lungs every 6 (six) hours as needed for wheezing or shortness of breath. 8 g 2   amLODipine (NORVASC) 10 MG tablet  Take 1 tablet (10 mg total) by mouth daily. 90 tablet 1   aspirin EC 81 MG tablet Take 81 mg by mouth daily as needed for mild pain.     Cholecalciferol (VITAMIN D) 125 MCG (5000 UT) CAPS Take 5,000 Units by mouth daily.     dicyclomine (BENTYL) 10 MG capsule Take 1 capsule (10 mg total) by mouth every 12 (twelve) hours as needed for spasms (abdominal pain). 180 capsule 1   meclizine (ANTIVERT) 25 MG tablet Take 1 tablet (25 mg total) by mouth 3 (three) times daily as needed for dizziness. 30 tablet 0   methocarbamol (ROBAXIN) 500 MG tablet Take 1 tablet (500 mg total) by mouth every 12 (twelve) hours as needed for muscle spasms. 30 tablet 1   metoprolol tartrate (LOPRESSOR) 50 MG tablet Take 1 tablet (50 mg total) by mouth 2 (two) times daily. 90 tablet 2   Omega-3 Fatty Acids (FISH OIL) 1000 MG CAPS Take 2 capsules (2,000 mg total) by mouth in the morning and at bedtime. 90 capsule 3   omeprazole (PRILOSEC) 40 MG capsule Take 1 capsule (40 mg total) by mouth daily. 60 capsule 2   ondansetron (ZOFRAN) 4 MG tablet Take 1 tablet (4 mg total) by mouth every 8 (eight) hours as needed for nausea or vomiting. 180 tablet 1   oxybutynin (DITROPAN-XL) 10 MG 24 hr tablet Take 2 tablets (20 mg total) by mouth at bedtime. 60 tablet 2   rosuvastatin (CRESTOR) 5 MG tablet Take 1 tablet (5 mg total) by mouth daily. 90 tablet 3   vitamin B-12 (CYANOCOBALAMIN) 1000 MCG tablet Take 1  tablet (1,000 mcg total) by mouth daily. 60 tablet 1   No current facility-administered medications for this visit.    Allergies: Allergies  Allergen Reactions   Other     "laughing gas"= skin feels like it burning    Past Medical History:  Diagnosis Date   Anxiety    Phreesia 12/30/2019   Depression    GERD (gastroesophageal reflux disease)    Hypertension    Past Surgical History:  Procedure Laterality Date   BIOPSY  05/22/2022   Procedure: BIOPSY;  Surgeon: Dolores Frame, MD;  [facility];   Service: Gastroenterology;;   BIOPSY  11/26/2022   Procedure: BIOPSY;  Surgeon: Dolores Frame, MD;  [facility];  Service: Gastroenterology;;   CHOLECYSTECTOMY     COLONOSCOPY WITH PROPOFOL N/A 05/22/2022   Procedure: COLONOSCOPY WITH PROPOFOL;  Surgeon: Dolores Frame, MD;  [facility];  Service: Gastroenterology;  Laterality: N/A;  1215 ASA 2   ESOPHAGOGASTRODUODENOSCOPY (EGD) WITH PROPOFOL N/A 11/26/2022   Procedure: ESOPHAGOGASTRODUODENOSCOPY (EGD) WITH PROPOFOL;  Surgeon: Dolores Frame, MD;  [facility];  Service: Gastroenterology;  Laterality: N/A;  1:00PM;asa 2   POLYPECTOMY  05/22/2022   Procedure: POLYPECTOMY;  Surgeon: Marguerita Merles, Reuel Boom, MD;  [facility];  Service: Gastroenterology;;   TUBAL LIGATION     Family History  Problem Relation Age of Onset   Hypertension Father    Hypercholesterolemia Father    COPD Mother    Emphysema Mother    Social History   Socioeconomic History   Marital status: Legally Separated    Spouse name: Not on file   Number of children: Not on file   Years of education: Not on file   Highest education level: GED or equivalent  Occupational History   Not on file  Tobacco Use   Smoking status: Every Day    Current packs/day: 1.50    Average packs/day: 1.5 packs/day for 34.0 years (51.0 ttl pk-yrs)    Types: Cigarettes    Passive exposure: Current   Smokeless tobacco: Never  Vaping Use   Vaping status: Never Used  Substance and Sexual Activity   Alcohol use: Not Currently   Drug use: No   Sexual activity: Yes    Birth control/protection: Surgical  Other Topics Concern   Not on file  Social History Narrative   Not on file   Social Determinants of Health   Financial Resource Strain: Medium Risk (01/28/2023)   Overall Financial Resource Strain (CARDIA)    Difficulty of Paying Living Expenses: Somewhat hard  Food Insecurity: Patient Declined  (01/28/2023)   Hunger Vital Sign    Worried About Running Out of Food in the Last Year: Patient declined    Ran Out of Food in the Last Year: Patient declined  Transportation Needs: Unmet Transportation Needs (01/28/2023)   PRAPARE - Administrator, Civil Service (Medical): Yes    Lack of Transportation (Non-Medical): Yes  Physical Activity: Insufficiently Active (01/28/2023)   Exercise Vital Sign    Days of Exercise per Week: 3 days    Minutes of Exercise per Session: 10 min  Stress: Stress Concern Present (01/28/2023)   [employer] of Occupational Health - Occupational Stress Questionnaire    Feeling of Stress : Rather much  Social Connections: Unknown (01/28/2023)   Social Connection and Isolation Panel [NHANES]    Frequency of Communication with Friends and Family: Not on file    Frequency  of Social Gatherings with Friends and Family: Patient declined    Attends Religious Services: Never    Diplomatic Services operational officer: No    Attends Engineer, structural: Not on file    Marital Status: Separated  Intimate Partner Violence: Not on file    SUBJECTIVE  Review of Systems Constitutional: Patient denies any unintentional weight loss or change in strength lntegumentary: Patient denies any rashes or pruritus Eyes: Patient denies dry eyes ENT: Patient denies dry mouth Cardiovascular: Patient denies chest pain or syncope Respiratory: Patient denies shortness of breath Gastrointestinal: Patient denies nausea, vomiting, constipation, or diarrhea Musculoskeletal: Patient denies muscle cramps or weakness Neurologic: Patient denies convulsions or seizures Psychiatric: Patient denies memory problems Allergic/Immunologic: Patient denies recent allergic reaction(s) Hematologic/Lymphatic: Patient denies bleeding tendencies Endocrine: Patient denies heat/cold intolerance  GU: As per HPI.  OBJECTIVE Vitals:   04/07/23 0945  BP: 118/82  Pulse: 71  Temp:  98.1 F (36.7 C)   There is no height or weight on file to calculate BMI.  Physical Examination  Constitutional: No obvious distress; patient is non-toxic appearing  Cardiovascular: No visible lower extremity edema.  Respiratory: The patient does not have audible wheezing/stridor; respirations do not appear labored  Gastrointestinal: Abdomen non-distended Musculoskeletal: Normal ROM of UEs  Skin: No obvious rashes/open sores  Neurologic: CN 2-12 grossly intact Psychiatric: Answered questions appropriately with normal affect  Hematologic/Lymphatic/Immunologic: No obvious bruises or sites of spontaneous bleeding  UA: 3-10 RBC/hpf, no evidence of UTI  PVR: 72 ml  ASSESSMENT Overactive bladder - Plan: Urinalysis, Routine w reflex microscopic, BLADDER SCAN AMB NON-IMAGING, oxybutynin (DITROPAN-XL) 10 MG 24 hr tablet  Mixed stress and urge urinary incontinence - Plan: oxybutynin (DITROPAN-XL) 10 MG 24 hr tablet  Caffeine use  OSA (obstructive sleep apnea)  Microscopic hematuria - Plan: CT HEMATURIA WORKUP, CANCELED: CT ABDOMEN PELVIS W WO CONTRAST  Tobacco abuse - Plan: CT HEMATURIA WORKUP, CANCELED: CT ABDOMEN PELVIS W WO CONTRAST  We discussed the different forms of urinary incontinence, such as stress and urge incontinence, and described how symptoms are consistent with mixed urinary incontinence.  1. For treatment of stress urinary incontinence: We discussed expectant management versus nonsurgical options versus surgery. Nonsurgical options include weight loss, Kegel exercises, with or without physical therapy, as well as an incontinence pessary. Surgical options may include midurethral sling, Burch urethropexy, autologous fascial sling, or transurethral injection of a bulking agent.   She elected to proceed with expectant management for SUI at this time.  2. For treatment of OAB with urinary frequency, nocturia, urgency, and urge incontinence: We discussed the symptoms of  overactive bladder (OAB), which include urinary urgency, frequency, nocturia, with or without urge incontinence. Likely exacerbated by caffeine intake.   We discussed the following management options in detail including potential benefits, risks, and side effects: Behavioral therapy: Modify fluid intake Decreasing bladder irritants (caffeine) Bladder retraining / timed voiding Medication(s): - Tolerating Oxybutynin XL 15 mg daily but ineffective; agreed to increase to 20 mg daily. - Discussed option to add on a beta-3 agonist medication in the future  She decided to proceed with increasing Oxybutynin XL to 20 mg daily and to work on behavioral modifications including minimizing caffeine intake and working on timed voiding / bladder retraining.   For asymptomatic microscopic hematuria we discussed possible etiologies including but not limited to: vigorous exercise, sexual activity, stone, trauma, blood thinner use, urinary tract infection, urethral irritation secondary to vaginal atrophy, chronic kidney disease, glomerulonephropathy, malignancy. We  discussed pt's smoking as a risk factor for GU cancer and encouraged smoking cessation.  We reviewed the AUA 2020 [facility] guideline and risk stratification for this patient. Based on individual risk factors, pt was advised that the recommended workup includes CT urogram and cystoscopy. Pt decided to pursue this work-up and follow-up afterward to discuss the results and formulate a treatment plan based on the findings. All questions were answered.   PLAN Advised the following: CT urogram ordered. 2. Oxybutynin XL 20 mg daily. 3. Minimize caffeine intake. 4. Work on timed voiding / bladder retraining. 5. Return for 1st available cystoscopy with any urology MD.   Orders Placed This Encounter  Procedures   CT HEMATURIA WORKUP    Standing Status:   Future    Standing Expiration Date:   04/06/2024    Order Specific Question:   Reason for Exam (SYMPTOM   OR DIAGNOSIS REQUIRED)    Answer:   microscopic hematuria    Order Specific Question:   Is patient pregnant?    Answer:   No    Order Specific Question:   Preferred imaging location?    Answer:   [facility]   Urinalysis, Routine w reflex microscopic   BLADDER SCAN AMB NON-IMAGING    It has been explained that the patient is to follow regularly with their PCP in addition to all other providers involved in their care and to follow instructions provided by these respective offices. Patient advised to contact urology clinic if any urologic-pertaining questions, concerns, new symptoms or problems arise in the interim period.  Patient Instructions       Overactive bladder (OAB) overview for patients:  Symptoms may include: urinary urgency ("gotta go" feeling) urinary frequency (voiding >8 times per day) night time urination (nocturia) urge incontinence of urine (UUI)  While we do not know the exact etiology of OAB, several treatment options exist including:  Behavioral therapy: Reducing fluid intake Decreasing bladder stimulants (such as caffeine) and irritants (such as acidic food, spicy foods, alcohol) Urge suppression strategies Bladder retraining via timed voiding  Pelvic floor physical therapy  Medication(s) - can use one or both of the drug classes below. Anticholinergic / antimuscarinic medications:  Mechanism of action: Activate M3 receptors to reduce detrusor stimulation and increase bladder capacity   (parasympathetic nervous system). Effect: Relaxes the bladder to decrease overactivity, increase bladder storage capacity, and increase time between voids. Onset: Slow acting (may take 8-12 weeks to determine efficacy). Medications include: Vesicare (Solifenacin), Ditropan (Oxybutynin), Detrol (Tolterodine), Toviaz (Fesoterodine), Sanctura (Trospium), Urispas (Flavoxate), Enablex (Darifenacin), Bentyl (Dicyclomine), Levsin (Hyoscyamine ). Potential side effects  include but are not limited to: Dry eyes, dry mouth, constipation, cognitive impairment, dementia risk with long term use, and urinary retention/ incomplete bladder emptying. Insurance companies generally prefer for patients to try 1-2 anticholinergic / antimuscarinic medications first due to low cost. Some exceptions are made based on patient-specific comorbidities / risk factors. Beta-3 agonist medications: Mechanism of action: Stimulates selective B3 adrenergic receptors to cause smooth muscle bladder relaxation (sympathetic nervous system). Effect: Relaxes the bladder to decrease overactivity, increase bladder storage capacity, and increase time between voids. Onset: Slow acting (may take 8-12 weeks to determine efficacy). Medications include: Myrbetriq (Mirabegron) and Vibegron Leslye Peer). Potential side effects include but are not limited to: urinary retention / incomplete bladder emptying and elevated blood pressure (more likely to occur in individuals with pre-existing uncontrolled hypertension). These medications tend to be more expensive than the anticholinergic / antimuscarinic medications.   For  patients with refractory OAB (if the above treatment options have been unsuccessful): Posterior tibial nerve stimulation (PTNS). Small acupuncture-type needle inserted near ankle with electric current to stimulate bladder via posterior tibial nerve pathway. Initially requires 12 weekly in-office treatments lasting 30 minutes each; followed by monthly in-office treatments lasting 30 minutes each for 1 year.  Bladder Botox injections. How it is done: Typically done via in-office cystoscopy; sometimes done in the OR depending on the situation. The bladder is numbed with lidocaine instilled via a catheter. Then the urologist injects Botox into the bladder muscle wall in about 20 locations. Causes local paralysis of the bladder muscle at the injection sites to reduce bladder muscle overactivity / spasms.  The effect lasts for approximately 6 months and cannot be reversed once performed. Risks may included but are not limited to: infection, incomplete bladder emptying/ urinary retention, short term need for self-catheterization or indwelling catheter, and need for repeat therapy. There is a 5-12% chance of needing to catheterize with Botox - that usually resolves in a few months as the Botox wears off. Typically Botox injections would need to be repeated every 3-12 months since this is not a permanent therapy.  Sacral neuromodulation trial (Medtronic lnterStim or Axonics implant). Sacral neuromodulation is FDA-approved for uncontrolled urinary urgency, urinary frequency, urinary urge incontinence, non-obstructive urinary retention, or fecal incontinence. It is not FDA-approved as a treatment for pain. The goal of this therapy is at least a 50% improvement in symptoms. It is NOT realistic to expect a 100% cure. This is a a 2-step outpatient procedure. After a successful test period, a permanent wire and generator are placed in the OR. We discussed the risk of infection. We reviewed the fact that about 30% of patients fail the test phase and are not candidates for permanent generator placement. During the 1-2 week trial phase, symptoms are documented by the patient to determine response. If patient gets at least a 50% improvement in symptoms, they may then proceed with Step 2. Step 1: Trial lead placement. Per physician discretion, may done one of two ways: Percutaneous nerve evaluation (PNE) in the [facility] urology office. Performed by urologist under local anesthesia (numbing the area with lidocaine) using a spinal needle for placement of test wire, which usually stays in place for 5-7 days to determine therapy response. Test lead placement in OR under anesthesia. Usually stays in place 2 weeks to determine therapy response. > Step 2: Permanent implantation of sacral neuromodulation device, which is  performed in the OR.  Sacral neuromodulation implants: All are conditionally MRI safe. Manufacturer: Medtronic Website: [employer] therapy/right-for-you.html Options: lnterStim X: Non-rechargeable. The battery lasts 10 years on average. lnterStim Micro: Rechargeable. The battery lasts 15 years on average and must be charged routinely. Approximately 50% smaller implant than lnterStim X implant.  Manufacturer: Axonics Website: Findrealrelief.axonics.com Options: Non-rechargeable (Axonics F15): The battery lasts 15 years on average. Rechargeable (Axonics R20): The battery lasts 20 years on average and must be charged in office for about 1 hour every 6-10 months on average. Approximately 50% smaller implant than Axonics non-rechargeable implant.  Note: Generally the rechargeable devices are only advised for very small or thin patients who may not have sufficient adipose tissue to comfortably overlay the implanted device.  Suprapubic catheter (SP tube) placement. Only done in severely refractory OAB when all other options have failed or are not a viable treatment choice depending on patient factors. Involves placement of a catheter through the lower abdomen into the bladder to continuously drain  the bladder into an external collection bag, which patient can then empty at their convenience every few hours. Done via an outpatient surgical procedure in the OR under anesthesia. Risks may included but are not limited to: surgical site pain, infections, skin irritation / breakdown, chronic bacteriuria, symptomatic UTls. The SP tube must stay in place continuously. This is a reversible procedure however - the insertion site will close if catheter is removed for more than a few hours. The SP tube must be exchanged routinely every 4 weeks to prevent the catheter from becoming clogged with sediment. SP tube exchanges are  typically performed at a urology nurse visit or by a home health nurse.   Electronically signed by:  Donnita Falls, MSN, FNP-C, CUNP 04/07/2023 10:27 AM

## 2023-04-03 ENCOUNTER — Ambulatory Visit (HOSPITAL_COMMUNITY)
Admission: RE | Admit: 2023-04-03 | Discharge: 2023-04-03 | Disposition: A | Discharge status: Home / Self Care | Payer: Medicaid Other | Source: Ambulatory Visit | Attending: Family Medicine

## 2023-04-03 DIAGNOSIS — M542 Cervicalgia: Secondary | ICD-10-CM

## 2023-04-03 DIAGNOSIS — I10 Essential (primary) hypertension: Secondary | ICD-10-CM | POA: Diagnosis not present

## 2023-04-03 DIAGNOSIS — E782 Mixed hyperlipidemia: Secondary | ICD-10-CM | POA: Diagnosis not present

## 2023-04-03 LAB — URINE CULTURE

## 2023-04-04 LAB — BMP8+EGFR
BUN/Creatinine Ratio: 10 (ref 9–23)
BUN: 10 mg/dL (ref 6–24)
CO2: 22 mmol/L (ref 20–29)
Calcium: 9.4 mg/dL (ref 8.7–10.2)
Chloride: 104 mmol/L (ref 96–106)
Creatinine, Ser: 1 mg/dL (ref 0.57–1.00)
Glucose: 100 mg/dL — ABNORMAL HIGH (ref 70–99)
Potassium: 4.6 mmol/L (ref 3.5–5.2)
Sodium: 140 mmol/L (ref 134–144)
eGFR: 70 mL/min/{1.73_m2} (ref >59)

## 2023-04-04 LAB — CBC WITH DIFFERENTIAL/PLATELET
Basophils Absolute: 0.1 10*3/uL (ref 0.0–0.2)
Basos: 1 %
EOS (ABSOLUTE): 0.1 10*3/uL (ref 0.0–0.4)
Eos: 2 %
Hematocrit: 46.9 % — ABNORMAL HIGH (ref 34.0–46.6)
Hemoglobin: 15.6 g/dL (ref 11.1–15.9)
Immature Grans (Abs): 0 10*3/uL (ref 0.0–0.1)
Immature Granulocytes: 0 %
Lymphocytes Absolute: 2.4 10*3/uL (ref 0.7–3.1)
Lymphs: 28 %
MCH: 32.3 pg (ref 26.6–33.0)
MCHC: 33.3 g/dL (ref 31.5–35.7)
MCV: 97 fL (ref 79–97)
Monocytes Absolute: 0.6 10*3/uL (ref 0.1–0.9)
Monocytes: 7 %
Neutrophils Absolute: 5.4 10*3/uL (ref 1.4–7.0)
Neutrophils: 62 %
Platelets: 302 10*3/uL (ref 150–450)
RBC: 4.83 x10E6/uL (ref 3.77–5.28)
RDW: 12.7 % (ref 11.7–15.4)
WBC: 8.6 10*3/uL (ref 3.4–10.8)

## 2023-04-04 LAB — LIPID PANEL
Chol/HDL Ratio: 6.3 ratio — ABNORMAL HIGH (ref 0.0–4.4)
Cholesterol, Total: 150 mg/dL (ref 100–199)
HDL: 24 mg/dL — ABNORMAL LOW (ref >39)
LDL Chol Calc (NIH): 89 mg/dL (ref 0–99)
Triglycerides: 219 mg/dL — ABNORMAL HIGH (ref 0–149)
VLDL Cholesterol Cal: 37 mg/dL (ref 5–40)

## 2023-04-05 ENCOUNTER — Other Ambulatory Visit: Payer: Self-pay | Admitting: Family Medicine

## 2023-04-05 MED ORDER — FISH OIL 1000 MG PO CAPS: 2000.0000 mg | ORAL_CAPSULE | Freq: Two times a day (BID) | ORAL | 3 refills | Status: AC

## 2023-04-05 MED ORDER — ROSUVASTATIN CALCIUM 5 MG PO TABS
5.0000 mg | ORAL_TABLET | Freq: Every day | ORAL | 3 refills | Status: DC
Start: 2023-04-05 — End: 2024-04-25

## 2023-04-07 ENCOUNTER — Encounter: Payer: Self-pay | Admitting: Urology

## 2023-04-07 ENCOUNTER — Ambulatory Visit: Payer: Medicaid Other | Admitting: Urology

## 2023-04-07 VITALS — BP 118/82 | HR 71 | Temp 98.1°F

## 2023-04-07 DIAGNOSIS — N3946 Mixed incontinence: Secondary | ICD-10-CM | POA: Diagnosis not present

## 2023-04-07 DIAGNOSIS — R3129 Other microscopic hematuria: Secondary | ICD-10-CM

## 2023-04-07 DIAGNOSIS — N3281 Overactive bladder: Secondary | ICD-10-CM | POA: Diagnosis not present

## 2023-04-07 DIAGNOSIS — Z720 Tobacco use: Secondary | ICD-10-CM

## 2023-04-07 DIAGNOSIS — G4733 Obstructive sleep apnea (adult) (pediatric): Secondary | ICD-10-CM | POA: Insufficient documentation

## 2023-04-07 DIAGNOSIS — Z789 Other specified health status: Secondary | ICD-10-CM

## 2023-04-07 LAB — URINALYSIS, ROUTINE W REFLEX MICROSCOPIC
Bilirubin, UA: NEGATIVE
Glucose, UA: NEGATIVE
Ketones, UA: NEGATIVE
Leukocytes,UA: NEGATIVE
Nitrite, UA: NEGATIVE
Protein,UA: NEGATIVE
Specific Gravity, UA: <1.005 — ABNORMAL LOW (ref 1.005–1.030)
Urobilinogen, Ur: 0.2 mg/dL (ref 0.2–1.0)
pH, UA: 6 (ref 5.0–7.5)

## 2023-04-07 LAB — MICROSCOPIC EXAMINATION
Bacteria, UA: NONE SEEN
WBC, UA: NONE SEEN /hpf (ref 0–5)

## 2023-04-07 MED ORDER — OXYBUTYNIN CHLORIDE ER 10 MG PO TB24: 20.0000 mg | ORAL_TABLET | Freq: Every day | ORAL | 2 refills | Status: DC

## 2023-04-07 NOTE — Patient Instructions (Signed)
Overactive bladder (OAB) overview for patients:  Symptoms may include: urinary urgency ("gotta go" feeling) urinary frequency (voiding >8 times per day) night time urination (nocturia) urge incontinence of urine (UUI)  While we do not know the exact etiology of OAB, several treatment options exist including:  Behavioral therapy: Reducing fluid intake Decreasing bladder stimulants (such as caffeine) and irritants (such as acidic food, spicy foods, alcohol) Urge suppression strategies Bladder retraining via timed voiding  Pelvic floor physical therapy  Medication(s) - can use one or both of the drug classes below. Anticholinergic / antimuscarinic medications:  Mechanism of action: Activate M3 receptors to reduce detrusor stimulation and increase bladder capacity  (parasympathetic nervous system). Effect: Relaxes the bladder to decrease overactivity, increase bladder storage capacity, and increase time between voids. Onset: Slow acting (may take 8-12 weeks to determine efficacy). Medications include: Vesicare (Solifenacin), Ditropan (Oxybutynin), Detrol (Tolterodine), Toviaz (Fesoterodine), Sanctura (Trospium), Urispas (Flavoxate), Enablex (Darifenacin), Bentyl (Dicyclomine), Levsin (Hyoscyamine ). Potential side effects include but are not limited to: Dry eyes, dry mouth, constipation, cognitive impairment, dementia risk with long term use, and urinary retention/ incomplete bladder emptying. Insurance companies generally prefer for patients to try 1-2 anticholinergic / antimuscarinic medications first due to low cost. Some exceptions are made based on patient-specific comorbidities / risk factors. Beta-3 agonist medications: Mechanism of action: Stimulates selective B3 adrenergic receptors to cause smooth muscle bladder relaxation (sympathetic nervous system). Effect: Relaxes the bladder to decrease overactivity, increase bladder storage capacity, and increase time  between voids. Onset: Slow acting (may take 8-12 weeks to determine efficacy). Medications include: Myrbetriq (Mirabegron) and Vibegron [name]). Potential side effects include but are not limited to: urinary retention / incomplete bladder emptying and elevated blood pressure (more likely to occur in individuals with pre-existing uncontrolled hypertension). These medications tend to be more expensive than the anticholinergic / antimuscarinic medications.   For patients with refractory OAB (if the above treatment options have been unsuccessful): Posterior tibial nerve stimulation (PTNS). Small acupuncture-type needle inserted near ankle with electric current to stimulate bladder via posterior tibial nerve pathway. Initially requires 12 weekly in-office treatments lasting 30 minutes each; followed by monthly in-office treatments lasting 30 minutes each for 1 year.  Bladder Botox injections. How it is done: Typically done via in-office cystoscopy; sometimes done in the OR depending on the situation. The bladder is numbed with lidocaine instilled via a catheter. Then the urologist injects Botox into the bladder muscle wall in about 20 locations. Causes local paralysis of the bladder muscle at the injection sites to reduce bladder muscle overactivity / spasms. The effect lasts for approximately 6 months and cannot be reversed once performed. Risks may included but are not limited to: infection, incomplete bladder emptying/ urinary retention, short term need for self-catheterization or indwelling catheter, and need for repeat therapy. There is a 5-12% chance of needing to catheterize with Botox - that usually resolves in a few months as the Botox wears off. Typically Botox injections would need to be repeated every 3-12 months since this is not a permanent therapy.  Sacral neuromodulation trial (Medtronic lnterStim or Axonics implant). Sacral neuromodulation is FDA-approved for uncontrolled urinary  urgency, urinary frequency, urinary urge incontinence, non-obstructive urinary retention, or fecal incontinence. It is not FDA-approved as a treatment for pain. The goal of this therapy is at least a 50% improvement in symptoms. It is NOT realistic to expect a 100% cure. This is a a 2-step outpatient procedure. After a successful  test period, a permanent wire and generator are placed in the OR. We discussed the risk of infection. We reviewed the fact that about 30% of patients fail the test phase and are not candidates for permanent generator placement. During the 1-2 week trial phase, symptoms are documented by the patient to determine response. If patient gets at least a 50% improvement in symptoms, they may then proceed with Step 2. Step 1: Trial lead placement. Per physician discretion, may done one of two ways: Percutaneous nerve evaluation (PNE) in the [facility] urology office. Performed by urologist under local anesthesia (numbing the area with lidocaine) using a spinal needle for placement of test wire, which usually stays in place for 5-7 days to determine therapy response. Test lead placement in OR under anesthesia. Usually stays in place 2 weeks to determine therapy response. > Step 2: Permanent implantation of sacral neuromodulation device, which is performed in the OR.  Sacral neuromodulation implants: All are conditionally MRI safe. Manufacturer: Medtronic Website: [employer] therapy/right-for-you.html Options: lnterStim X: Non-rechargeable. The battery lasts 10 years on average. lnterStim Micro: Rechargeable. The battery lasts 15 years on average and must be charged routinely. Approximately 50% smaller implant than lnterStim X implant.  Manufacturer: Axonics Website: Findrealrelief.axonics.com Options: Non-rechargeable (Axonics F15): The battery lasts 15 years on average. Rechargeable (Axonics R20):  The battery lasts 20 years on average and must be charged in office for about 1 hour every 6-10 months on average. Approximately 50% smaller implant than Axonics non-rechargeable implant.  Note: Generally the rechargeable devices are only advised for very small or thin patients who may not have sufficient adipose tissue to comfortably overlay the implanted device.  Suprapubic catheter (SP tube) placement. Only done in severely refractory OAB when all other options have failed or are not a viable treatment choice depending on patient factors. Involves placement of a catheter through the lower abdomen into the bladder to continuously drain the bladder into an external collection bag, which patient can then empty at their convenience every few hours. Done via an outpatient surgical procedure in the OR under anesthesia. Risks may included but are not limited to: surgical site pain, infections, skin irritation / breakdown, chronic bacteriuria, symptomatic UTls. The SP tube must stay in place continuously. This is a reversible procedure however - the insertion site will close if catheter is removed for more than a few hours. The SP tube must be exchanged routinely every 4 weeks to prevent the catheter from becoming clogged with sediment. SP tube exchanges are typically performed at a urology nurse visit or by a home health nurse.

## 2023-04-07 NOTE — Progress Notes (Signed)
post void residual=72

## 2023-04-20 ENCOUNTER — Ambulatory Visit (HOSPITAL_COMMUNITY)
Admission: RE | Admit: 2023-04-20 | Discharge: 2023-04-20 | Disposition: A | Discharge status: Home / Self Care | Payer: Medicaid Other | Source: Ambulatory Visit | Attending: Urology | Admitting: Urology

## 2023-04-20 DIAGNOSIS — Z720 Tobacco use: Secondary | ICD-10-CM | POA: Insufficient documentation

## 2023-04-20 DIAGNOSIS — R3129 Other microscopic hematuria: Secondary | ICD-10-CM | POA: Insufficient documentation

## 2023-04-20 MED ORDER — IOHEXOL 300 MG/ML  SOLN
125.0000 mL | Freq: Once | INTRAMUSCULAR | Status: AC | PRN
  Administered 2023-04-20: 125 mL via INTRAVENOUS

## 2023-04-21 NOTE — Progress Notes (Signed)
Please let patient know that her CT showed: 1. No acute GU findings - no GU stones, masses, or hydronephrosis.  2. Incidental finding of bilateral fat-containing inguinal hernias. She is advised to discuss this with her PCP & GI providers. Will need referral to General Surgery if desiring surgical correction; please let me know if she would like me to place that referral for her.   Advised to follow up for cystoscopy as planned on 04/23/2023 with Dr. Retta Diones for microhematuria workup.

## 2023-04-22 ENCOUNTER — Ambulatory Visit (HOSPITAL_COMMUNITY): Payer: Medicaid Other

## 2023-04-23 ENCOUNTER — Ambulatory Visit: Payer: Medicaid Other | Admitting: Urology

## 2023-04-23 ENCOUNTER — Encounter: Payer: Self-pay | Admitting: Urology

## 2023-04-23 VITALS — BP 123/88 | HR 73 | Ht 63.0 in | Wt 209.0 lb

## 2023-04-23 DIAGNOSIS — R3129 Other microscopic hematuria: Secondary | ICD-10-CM | POA: Diagnosis not present

## 2023-04-23 DIAGNOSIS — R399 Unspecified symptoms and signs involving the genitourinary system: Secondary | ICD-10-CM | POA: Diagnosis not present

## 2023-04-23 LAB — URINALYSIS, ROUTINE W REFLEX MICROSCOPIC
Bilirubin, UA: NEGATIVE
Glucose, UA: NEGATIVE
Ketones, UA: NEGATIVE
Leukocytes,UA: NEGATIVE
Nitrite, UA: NEGATIVE
Protein,UA: NEGATIVE
Specific Gravity, UA: 1.015 (ref 1.005–1.030)
Urobilinogen, Ur: 0.2 mg/dL (ref 0.2–1.0)
pH, UA: 6 (ref 5.0–7.5)

## 2023-04-23 LAB — MICROSCOPIC EXAMINATION: Bacteria, UA: NONE SEEN

## 2023-04-23 MED ORDER — CIPROFLOXACIN HCL 500 MG PO TABS
500.0000 mg | ORAL_TABLET | Freq: Once | ORAL | Status: AC
Start: 2023-04-23 — End: 2023-04-23
  Administered 2023-04-23: 500 mg via ORAL

## 2023-04-23 NOTE — Progress Notes (Signed)
   History of Present Illness: [name] is a 47 y.o. year old female presents for cystoscopy complete evaluation for microscopic hematuria.  Past Medical History:  Diagnosis Date   Anxiety    [employer]ia 12/30/2019   Depression    GERD (gastroesophageal reflux disease)    Hypertension     Past Surgical History:  Procedure Laterality Date   BIOPSY  05/22/2022   Procedure: BIOPSY;  Surgeon: Dolores Frame, MD;  Location: [facility];  Service: Gastroenterology;;   BIOPSY  11/26/2022   Procedure: BIOPSY;  Surgeon: Dolores Frame, MD;  Location: [facility];  Service: Gastroenterology;;   CHOLECYSTECTOMY     COLONOSCOPY WITH PROPOFOL N/A 05/22/2022   Procedure: COLONOSCOPY WITH PROPOFOL;  Surgeon: Dolores Frame, MD;  Location: [facility];  Service: Gastroenterology;  Laterality: N/A;  1215 ASA 2   ESOPHAGOGASTRODUODENOSCOPY (EGD) WITH PROPOFOL N/A 11/26/2022   Procedure: ESOPHAGOGASTRODUODENOSCOPY (EGD) WITH PROPOFOL;  Surgeon: Dolores Frame, MD;  Location: [facility];  Service: Gastroenterology;  Laterality: N/A;  1:00PM;asa 2   POLYPECTOMY  05/22/2022   Procedure: POLYPECTOMY;  Surgeon: Dolores Frame, MD;  Location: [facility];  Service: Gastroenterology;;   TUBAL LIGATION      Home Medications:  (Not in a hospital admission)   Allergies:  Allergies  Allergen Reactions   Other     "laughing gas"= skin feels like it burning    Family History  Problem Relation Age of Onset   Hypertension Father    Hypercholesterolemia Father    COPD Mother    Emphysema Mother     Social History:  reports that she has been smoking cigarettes. She has a 51 pack-year smoking history. She has been exposed to tobacco smoke. She has never used smokeless tobacco. She reports that she does not currently use alcohol. She reports that she does not use drugs.  ROS: A complete review of systems was performed.  All systems are  negative except for pertinent findings as noted.  Physical Exam:  Vital signs in last 24 hours: @VSRANGES @ General:  Alert and oriented, No acute distress HEENT: Normocephalic, atraumatic Neck: No JVD or lymphadenopathy Cardiovascular: Regular rate  Lungs: Normal inspiratory/expiratory excursion Abdomen: Soft, nontender, nondistended, no abdominal masses Back: No CVA tenderness Extremities: No edema Neurologic: Grossly intact  I have reviewed prior pt notes  I have reviewed urinalysis results  I have independently reviewed prior imaging--CT images reviewed 1. Fat containing bilateral inguinal hernias. 2. No urinary tract abnormalities identified and no acute abdominal or pelvic pathology.   I have reviewed prior urine culture   Indication: microhematuria  After informed consent and discussion of the procedure and its risks, [name] was positioned and prepped in the standard fashion.  Cystoscopy was performed with a flexible cystoscope.   Findings:  Urethra: Normal Ureteral orifices: Normal bilaterally Bladder: No urothelial lesions   Impression/Assessment:  1.  Microscopic hematuria in a smoker with negative CT/cystoscopy  2.  Overactive bladder symptoms, on oxybutynin XR 20 mg at night  Plan:  1.  Continue OAB management/behavioral modification  2.  Reassured regarding her CT findings (no consultation needed for fat filled inguinal hernias) and cystoscopy  3.  Stop smoking  4.  Office visit for recheck with Evette Georges, NP in 2 months  [name] 04/23/2023, 9:06 AM  [name]. Karissa Meenan MD

## 2023-04-27 ENCOUNTER — Other Ambulatory Visit (INDEPENDENT_AMBULATORY_CARE_PROVIDER_SITE_OTHER): Payer: Self-pay | Admitting: Gastroenterology

## 2023-05-04 ENCOUNTER — Ambulatory Visit (HOSPITAL_COMMUNITY)
Admission: RE | Admit: 2023-05-04 | Discharge: 2023-05-04 | Disposition: A | Discharge status: Home / Self Care | Payer: Medicaid Other | Source: Ambulatory Visit | Attending: Gastroenterology | Admitting: Gastroenterology

## 2023-05-04 ENCOUNTER — Encounter (INDEPENDENT_AMBULATORY_CARE_PROVIDER_SITE_OTHER): Payer: Self-pay

## 2023-05-04 ENCOUNTER — Other Ambulatory Visit (INDEPENDENT_AMBULATORY_CARE_PROVIDER_SITE_OTHER): Payer: Self-pay | Admitting: Gastroenterology

## 2023-05-04 DIAGNOSIS — K5900 Constipation, unspecified: Secondary | ICD-10-CM

## 2023-05-04 DIAGNOSIS — R112 Nausea with vomiting, unspecified: Secondary | ICD-10-CM | POA: Insufficient documentation

## 2023-05-04 DIAGNOSIS — Z9049 Acquired absence of other specified parts of digestive tract: Secondary | ICD-10-CM | POA: Diagnosis not present

## 2023-05-04 NOTE — Telephone Encounter (Signed)
Called patient to discuss and she states she vomited one time and told her to go to [facility] to have abdominal xray done tonight. She then states she is in severe pain. I advised her to go to emergency room for evaluation since in severe pain she states she is going now to [employer].

## 2023-05-04 NOTE — Telephone Encounter (Signed)
Patient called the office back to follow up on this note.

## 2023-05-05 ENCOUNTER — Other Ambulatory Visit (INDEPENDENT_AMBULATORY_CARE_PROVIDER_SITE_OTHER): Payer: Self-pay | Admitting: Gastroenterology

## 2023-05-05 MED ORDER — NA SULFATE-K SULFATE-MG SULF 17.5-3.13-1.6 GM/177ML PO SOLN: 1.0000 | Freq: Once | ORAL | 0 refills | Status: AC

## 2023-05-05 NOTE — Telephone Encounter (Signed)
Called radiology reading room and had order changed to stat so it could be read today.

## 2023-06-09 ENCOUNTER — Ambulatory Visit (INDEPENDENT_AMBULATORY_CARE_PROVIDER_SITE_OTHER): Payer: Medicaid Other | Admitting: Gastroenterology

## 2023-06-09 ENCOUNTER — Encounter (INDEPENDENT_AMBULATORY_CARE_PROVIDER_SITE_OTHER): Payer: Self-pay | Admitting: Gastroenterology

## 2023-06-09 VITALS — BP 121/85 | HR 86 | Temp 97.8°F | Ht 63.0 in | Wt 205.2 lb

## 2023-06-09 DIAGNOSIS — K5900 Constipation, unspecified: Secondary | ICD-10-CM | POA: Insufficient documentation

## 2023-06-09 DIAGNOSIS — K581 Irritable bowel syndrome with constipation: Secondary | ICD-10-CM | POA: Insufficient documentation

## 2023-06-09 DIAGNOSIS — K219 Gastro-esophageal reflux disease without esophagitis: Secondary | ICD-10-CM

## 2023-06-09 MED ORDER — LUBIPROSTONE 8 MCG PO CAPS: 8.0000 ug | ORAL_CAPSULE | Freq: Two times a day (BID) | ORAL | 1 refills | Status: DC

## 2023-06-09 NOTE — Patient Instructions (Signed)
Please continue with omeprazole 40mg  daily Increase water intake, aim for atleast 64 oz per day Increase fruits, veggies and whole grains, kiwi and prunes are especially good for constipation We will start amitiza twice daily As discussed, this can cause some abdominal cramping and looser stools when first started.  Follow up 3 months  It was a pleasure to see you today. I want to create trusting relationships with patients and provide genuine, compassionate, and quality care. I truly value your feedback! please be on the lookout for a survey regarding your visit with me today. I appreciate your input about our visit and your time in completing this!    Zyire Eidson L. Jeanmarie Hubert, MSN, APRN, AGNP-C Adult-Gerontology Nurse Practitioner [facility] at [facility]

## 2023-06-09 NOTE — Progress Notes (Signed)
Referring Provider: Wylene Men* Primary Care Physician:  Rica Records, FNP Primary GI Physician: Dr. Levon Hedger   Chief Complaint  Patient presents with   Follow-up    Patient here today due to having issues currently with constipation. Patient has uses suppositories, Milk of mag, Miralax,dulcolax. Patient says she is using Miralax bid, she says the bid Miralax is not helping.    HPI:   [name] is a 47 y.o. female with past medical history of anxiety, depression, HTN, GERD  Patient presenting today for follow up of nausea and abdominal pain   Last seen August 2024, at that time had continued bloating, some mid right upper quadrant pain.  Some early satiety.  Taking Zofran for nausea.  Using dicyclomine which helps some.  Having diarrhea x 1/2 weeks.  Up to 4 BMs per day.  No rectal bleeding.  Intermittent severe lower abdominal pain as well.  GERD well-managed with omeprazole.  Recommended C. difficile, GI pathogen panel, O&P, CT abdomen pelvis with contrast, consider GES if CT unremarkable, continue omeprazole 40 mg, continue Zofran 4 mg as needed for nausea.  Stool testing negative in August   Unfortunately CT A/P was not covered by her insurance, however, she underwent CT hematuria workup on 04/20/23 with urology which did not show any acute abnormalities other than fat containing bilateral inguinal hernias.  Abd xray on 10/21 with mild stool burden, advised to start bowel prep then miralax  Present: States that she is still having constipation, did bowel prep which cleaned her out but now constipated again. She notes stools very hard, she has to strain a lot to go. She is doing 1 capful of miralax per day. Having a BM maybe every 3-4 days. She has some cramping in her abdomen at times, takes dicyclomine as needed which seems to help. She notes that she will take MOM when constipation gets really bad as she does not feel that miralax does much. She  has cut back on soda and has tried to increase water intake.   She notes some occasional vomiting of yellow bile, happens at times of worse constipation. Notes GERD is well managed on omeprazole 40mg  daily. She has been trying to watch her diet to ensure no triggering foods or habits. Is not eating late.   Recent TSH and T4 in April was 2.2 and 1.35 respectively. CMP in September with normal electrolytes  Last Colonoscopy: 05/2022 - Two 3 to 5 mm polyps in the descending colon and                            in the ascending colon, removed with a cold snare.                            Resected and retrieved.                           - The rest of the examined colon is normal.                            Biopsied-normal random biopsies                           - Non-bleeding internal hemorrhoids. ( 1 TA, 1 hyperplastic polyp,  random biopsies normal)    Last Endoscopy:-11/2022 2 cm hiatal hernia.                           - Normal stomach. Biopsied.                           - Normal examined duodenum. Biopsied-normal, no celiac or h pylori  Repeat colonoscopy 7 years   Past Medical History:  Diagnosis Date   Anxiety    Phreesia 12/30/2019   Depression    GERD (gastroesophageal reflux disease)    Hypertension     Past Surgical History:  Procedure Laterality Date   BIOPSY  05/22/2022   Procedure: BIOPSY;  Surgeon: Dolores Frame, MD;  [facility];  Service: Gastroenterology;;   BIOPSY  11/26/2022   Procedure: BIOPSY;  Surgeon: Dolores Frame, MD;  [facility];  Service: Gastroenterology;;   CHOLECYSTECTOMY     COLONOSCOPY WITH PROPOFOL N/A 05/22/2022   Procedure: COLONOSCOPY WITH PROPOFOL;  Surgeon: Dolores Frame, MD;  [facility];  Service: Gastroenterology;  Laterality: N/A;  1215 ASA 2   ESOPHAGOGASTRODUODENOSCOPY (EGD) WITH PROPOFOL N/A 11/26/2022   Procedure: ESOPHAGOGASTRODUODENOSCOPY (EGD) WITH PROPOFOL;   Surgeon: Dolores Frame, MD;  [facility];  Service: Gastroenterology;  Laterality: N/A;  1:00PM;asa 2   POLYPECTOMY  05/22/2022   Procedure: POLYPECTOMY;  Surgeon: Marguerita Merles, Reuel Boom, MD;  [facility];  Service: Gastroenterology;;   TUBAL LIGATION      Current Outpatient Medications  Medication Sig Dispense Refill   albuterol (VENTOLIN HFA) 108 (90 Base) MCG/ACT inhaler Inhale 2 puffs into the lungs every 6 (six) hours as needed for wheezing or shortness of breath. 8 g 2   amLODipine (NORVASC) 10 MG tablet Take 1 tablet (10 mg total) by mouth daily. 90 tablet 1   aspirin EC 81 MG tablet Take 81 mg by mouth daily as needed for mild pain.     Cholecalciferol (VITAMIN D) 125 MCG (5000 UT) CAPS Take 5,000 Units by mouth daily.     dicyclomine (BENTYL) 10 MG capsule Take 1 capsule (10 mg total) by mouth every 12 (twelve) hours as needed for spasms (abdominal pain). 180 capsule 1   methocarbamol (ROBAXIN) 500 MG tablet Take 1 tablet (500 mg total) by mouth every 12 (twelve) hours as needed for muscle spasms. 30 tablet 1   metoprolol tartrate (LOPRESSOR) 50 MG tablet Take 1 tablet (50 mg total) by mouth 2 (two) times daily. 90 tablet 2   Omega-3 Fatty Acids (FISH OIL) 1000 MG CAPS Take 2 capsules (2,000 mg total) by mouth in the morning and at bedtime. 90 capsule 3   omeprazole (PRILOSEC) 40 MG capsule Take 1 capsule by mouth once daily 60 capsule 1   ondansetron (ZOFRAN) 4 MG tablet Take 1 tablet (4 mg total) by mouth every 8 (eight) hours as needed for nausea or vomiting. 180 tablet 1   oxybutynin (DITROPAN-XL) 10 MG 24 hr tablet Take 2 tablets (20 mg total) by mouth at bedtime. 60 tablet 2   rosuvastatin (CRESTOR) 5 MG tablet Take 1 tablet (5 mg total) by mouth daily. 90 tablet 3   vitamin B-12 (CYANOCOBALAMIN) 1000 MCG tablet Take 1 tablet (1,000 mcg total) by mouth daily. 60 tablet 1   No current facility-administered medications for this visit.     Allergies as of  06/09/2023 - Review Complete 06/09/2023  Allergen Reaction Noted   Other  09/13/2012    Family History  Problem Relation Age of Onset   Hypertension Father    Hypercholesterolemia Father    COPD Mother    Emphysema Mother     Social History   Socioeconomic History   Marital status: Legally Separated    Spouse name: Not on file   Number of children: Not on file   Years of education: Not on file   Highest education level: GED or equivalent  Occupational History   Not on file  Tobacco Use   Smoking status: Every Day    Current packs/day: 1.50    Average packs/day: 1.5 packs/day for 34.0 years (51.0 ttl pk-yrs)    Types: Cigarettes    Passive exposure: Current   Smokeless tobacco: Never  Vaping Use   Vaping status: Never Used  Substance and Sexual Activity   Alcohol use: Not Currently   Drug use: No   Sexual activity: Yes    Birth control/protection: Surgical  Other Topics Concern   Not on file  Social History Narrative   Not on file   Social Determinants of Health   Financial Resource Strain: Medium Risk (01/28/2023)   Overall Financial Resource Strain (CARDIA)    Difficulty of Paying Living Expenses: Somewhat hard  Food Insecurity: Patient Declined (01/28/2023)   Hunger Vital Sign    Worried About Running Out of Food in the Last Year: Patient declined    Ran Out of Food in the Last Year: Patient declined  Transportation Needs: Unmet Transportation Needs (01/28/2023)   PRAPARE - Administrator, Civil Service (Medical): Yes    Lack of Transportation (Non-Medical): Yes  Physical Activity: Insufficiently Active (01/28/2023)   Exercise Vital Sign    Days of Exercise per Week: 3 days    Minutes of Exercise per Session: 10 min  Stress: Stress Concern Present (01/28/2023)   [name] of [employer]ational [employer] - Occupational Stress Questionnaire    Feeling of Stress : Rather much  Social Connections: Unknown (01/28/2023)   Social  Connection and Isolation Panel [NHANES]    Frequency of Communication with Friends and Family: Not on file    Frequency of Social Gatherings with Friends and Family: Patient declined    Attends Religious Services: Never    Database administrator or Organizations: No    Attends Engineer, structural: Not on file    Marital Status: Separated    Review of systems General: negative for malaise, night sweats, fever, chills, weight loss Neck: Negative for lumps, goiter, pain and significant neck swelling Resp: Negative for cough, wheezing, dyspnea at rest CV: Negative for chest pain, leg swelling, palpitations, orthopnea GI: denies melena, hematochezia, nausea, vomiting, diarrhea, dysphagia, odyonophagia, early satiety or unintentional weight loss. +constipation  MSK: Negative for joint pain or swelling, back pain, and muscle pain. Derm: Negative for itching or rash Psych: Denies depression, anxiety, memory loss, confusion. No homicidal or suicidal ideation.  Heme: Negative for prolonged bleeding, bruising easily, and swollen nodes. Endocrine: Negative for cold or heat intolerance, polyuria, polydipsia and goiter. Neuro: negative for tremor, gait imbalance, syncope and seizures. The remainder of the review of systems is noncontributory.  Physical Exam: BP 121/85 (BP Location: Left Arm, Patient Position: Sitting, Cuff Size: Large)   Pulse 86   Temp 97.8 F (36.6 C) (Temporal)   Ht 5\' 3"  (1.6 m)   Wt 205 lb 3.2 oz (93.1 kg)  LMP 04/21/2023 (Approximate)   BMI 36.35 kg/m  General:   Alert and oriented. No distress noted. Pleasant and cooperative.  Head:  Normocephalic and atraumatic. Eyes:  Conjuctiva clear without scleral icterus. Mouth:  Oral mucosa pink and moist. Good dentition. No lesions. Heart: Normal rate and rhythm, s1 and s2 heart sounds present.  Lungs: Clear lung sounds in all lobes. Respirations equal and unlabored. Abdomen:  +BS, soft, non-tender and  non-distended. No rebound or guarding. No HSM or masses noted. Derm: No palmar erythema or jaundice Msk:  Symmetrical without gross deformities. Normal posture. Extremities:  Without edema. Neurologic:  Alert and  oriented x4 Psych:  Alert and cooperative. Normal mood and affect.  Invalid input(s): "6 MONTHS"   ASSESSMENT: [name] is a 47 y.o. female presenting today with constipation  Recent abdominal xray with moderate stool burden, given bowel prep which she felt cleaned her out though taking MiraLAX daily does not seem to be keeping her bowels moving.  Recent thyroid testing and CMP were within normal limits.  Fairly recent colonoscopy in 2023 with a few polyps and hemorrhoids but no other abnormalities.  At this time would recommend good water intake, aim for 64 ounces per day, diet high in fruits, veggies, whole grains, will start Amitiza 8 mcg twice daily.  Discussed with patient that she may experience some abdominal cramping and looser stools upon starting Amitiza but this should level out when she is on the medication for a few weeks.  If she is having severe abdominal cramping or more than 4 watery bowel movements per day on the medication she should let me know.  No red flag symptoms at this time.  GERD is well-controlled on omeprazole 40 mg daily, will continue with current PPI regimen and good reflux precautions.   PLAN:  Good water intake  2. Start amitiza BID  3. Increase water intake, aim for atleast 64 oz per day Increase fruits, veggies and whole grains, kiwi and prunes are especially good for constipation 4. Continue omeprazole 40mg  daily  All questions were answered, patient verbalized understanding and is in agreement with plan as outlined above.    Follow Up: 3 months   Kol Consuegra L. Jeanmarie Hubert, MSN, APRN, AGNP-C Adult-Gerontology Nurse Practitioner [facility]  I have reviewed the note and agree with the APP's assessment as  described in this progress note  Katrinka Blazing, MD Gastroenterology and Hepatology [facility]

## 2023-06-23 NOTE — Progress Notes (Unsigned)
Name: [name] DOB: 07/21/1975 MRN: [id number]  History of Present Illness: [name] is a 47 y.o. female who presents today for follow up visit at [facility] [place].  - GU history: 1. OAB with urinary frequency, nocturia, urgency, urge incontinence, and nocturnal enuresis. - No response to Oxybutynin XL 15 mg daily.  - Currently taking Oxybutynin XR 20 mg nightly.  - Reports history of obstructive sleep apnea; states CPAP was not advised at the time of her sleep study.  - Significant caffeine intake.  2. Stress urinary incontinence. - She elected expectant management for SUI on 04/07/2023.  3. Microscopic hematuria (negative workup). - 04/20/2023: CT hematuria protocol showed no acute GU findings - no GU stones, masses, or hydronephrosis.   At last visit with Dr. Retta Diones on 04/23/2023: Cystoscopy was unremarkable.  Today: She reports symptomatic improvement since increasing dose of Oxybutynin XL to 20 mg daily and decreasing her caffeine intake. She reports improved urinary frequency, nocturia, urgency, and urge incontinence. She reports that she is able to hold her urine for longer time periods and is no longer waking up at night to urinate or leaking overnight; no longer needing to wears pads overnight. Still reports stress urinary incontinence with leakage of small volumes of urine occasionally during the day but no longer having any urge incontinence. Overall she is very pleased with the improvement.    She denies dysuria, gross hematuria, straining to void, or sensations of incomplete emptying.   Fall Screening: Do you usually have a device to assist in your mobility? No   Medications: Current Outpatient Medications  Medication Sig Dispense Refill   albuterol (VENTOLIN HFA) 108 (90 Base) MCG/ACT inhaler Inhale 2 puffs into the lungs every 6 (six) hours as needed for wheezing or shortness of breath. 8 g 2   amLODipine (NORVASC) 10 MG tablet Take 1  tablet (10 mg total) by mouth daily. 90 tablet 1   aspirin EC 81 MG tablet Take 81 mg by mouth daily as needed for mild pain.     Cholecalciferol (VITAMIN D) 125 MCG (5000 UT) CAPS Take 5,000 Units by mouth daily.     dicyclomine (BENTYL) 10 MG capsule Take 1 capsule (10 mg total) by mouth every 12 (twelve) hours as needed for spasms (abdominal pain). 180 capsule 1   lubiprostone (AMITIZA) 8 MCG capsule Take 1 capsule (8 mcg total) by mouth 2 (two) times daily with a meal. 60 capsule 1   methocarbamol (ROBAXIN) 500 MG tablet Take 1 tablet (500 mg total) by mouth every 12 (twelve) hours as needed for muscle spasms. 30 tablet 1   metoprolol tartrate (LOPRESSOR) 50 MG tablet Take 1 tablet (50 mg total) by mouth 2 (two) times daily. 90 tablet 2   Omega-3 Fatty Acids (FISH OIL) 1000 MG CAPS Take 2 capsules (2,000 mg total) by mouth in the morning and at bedtime. 90 capsule 3   omeprazole (PRILOSEC) 40 MG capsule Take 1 capsule by mouth once daily 60 capsule 1   ondansetron (ZOFRAN) 4 MG tablet Take 1 tablet (4 mg total) by mouth every 8 (eight) hours as needed for nausea or vomiting. 180 tablet 1   rosuvastatin (CRESTOR) 5 MG tablet Take 1 tablet (5 mg total) by mouth daily. 90 tablet 3   vitamin B-12 (CYANOCOBALAMIN) 1000 MCG tablet Take 1 tablet (1,000 mcg total) by mouth daily. 60 tablet 1   oxybutynin (DITROPAN-XL) 10 MG 24 hr tablet Take 2 tablets (20 mg total) by  mouth at bedtime. 180 tablet 3   No current facility-administered medications for this visit.    Allergies: Allergies  Allergen Reactions   Other     "laughing gas"= skin feels like it burning    Past Medical History:  Diagnosis Date   Anxiety    Phreesia 12/30/2019   Depression    GERD (gastroesophageal reflux disease)    Hypertension    Past Surgical History:  Procedure Laterality Date   BIOPSY  05/22/2022   Procedure: BIOPSY;  Surgeon: Dolores Frame, MD;  [facility];  Service: Gastroenterology;;    BIOPSY  11/26/2022   Procedure: BIOPSY;  Surgeon: Dolores Frame, MD;  [facility];  Service: Gastroenterology;;   CHOLECYSTECTOMY     COLONOSCOPY WITH PROPOFOL N/A 05/22/2022   Procedure: COLONOSCOPY WITH PROPOFOL;  Surgeon: Dolores Frame, MD;  [facility];  Service: Gastroenterology;  Laterality: N/A;  1215 ASA 2   ESOPHAGOGASTRODUODENOSCOPY (EGD) WITH PROPOFOL N/A 11/26/2022   Procedure: ESOPHAGOGASTRODUODENOSCOPY (EGD) WITH PROPOFOL;  Surgeon: Dolores Frame, MD;  [facility];  Service: Gastroenterology;  Laterality: N/A;  1:00PM;asa 2   POLYPECTOMY  05/22/2022   Procedure: POLYPECTOMY;  Surgeon: Marguerita Merles, Reuel Boom, MD;  [facility];  Service: Gastroenterology;;   TUBAL LIGATION     Family History  Problem Relation Age of Onset   Hypertension Father    Hypercholesterolemia Father    COPD Mother    Emphysema Mother    Social History   Socioeconomic History   Marital status: Legally Separated    Spouse name: Not on file   Number of children: Not on file   Years of education: Not on file   Highest education level: GED or equivalent  Occupational History   Not on file  Tobacco Use   Smoking status: Every Day    Current packs/day: 1.50    Average packs/day: 1.5 packs/day for 34.0 years (51.0 ttl pk-yrs)    Types: Cigarettes    Passive exposure: Current   Smokeless tobacco: Never  Vaping Use   Vaping status: Never Used  Substance and Sexual Activity   Alcohol use: Not Currently   Drug use: No   Sexual activity: Yes    Birth control/protection: Surgical  Other Topics Concern   Not on file  Social History Narrative   Not on file   Social Drivers of Health   Financial Resource Strain: Medium Risk (01/28/2023)   Overall Financial Resource Strain (CARDIA)    Difficulty of Paying Living Expenses: Somewhat hard  Food Insecurity: Patient Declined (01/28/2023)   Hunger Vital Sign     Worried About Running Out of Food in the Last Year: Patient declined    Ran Out of Food in the Last Year: Patient declined  Transportation Needs: Unmet Transportation Needs (01/28/2023)   PRAPARE - Administrator, Civil Service (Medical): Yes    Lack of Transportation (Non-Medical): Yes  Physical Activity: Insufficiently Active (01/28/2023)   Exercise Vital Sign    Days of Exercise per Week: 3 days    Minutes of Exercise per Session: 10 min  Stress: Stress Concern Present (01/28/2023)   [employer] of Occupational Health - Occupational Stress Questionnaire    Feeling of Stress : Rather much  Social Connections: Unknown (01/28/2023)   Social Connection and Isolation Panel [NHANES]    Frequency of Communication with Friends and Family: Not on file    Frequency of Social Gatherings with  Friends and Family: Patient declined    Attends Religious Services: Never    Database administrator or Organizations: No    Attends Engineer, structural: Not on file    Marital Status: Separated  Intimate Partner Violence: Not on file    Review of Systems Constitutional: Patient denies any unintentional weight loss or change in strength lntegumentary: Patient denies any rashes or pruritus Cardiovascular: Patient denies chest pain or syncope Respiratory: Patient denies shortness of breath Gastrointestinal: Patient denies nausea, vomiting, constipation, or diarrhea Musculoskeletal: Patient denies muscle cramps or weakness Neurologic: Patient denies convulsions or seizures Allergic/Immunologic: Patient denies recent allergic reaction(s) Hematologic/Lymphatic: Patient denies bleeding tendencies Endocrine: Patient denies heat/cold intolerance  GU: As per HPI.  OBJECTIVE Vitals:   06/25/23 0906  BP: 111/79  Pulse: 76  Temp: 97.8 F (36.6 C)   There is no height or weight on file to calculate BMI.  Physical Examination Constitutional: No obvious distress; patient is  non-toxic appearing  Cardiovascular: No visible lower extremity edema.  Respiratory: The patient does not have audible wheezing/stridor; respirations do not appear labored  Gastrointestinal: Abdomen non-distended Musculoskeletal: Normal ROM of UEs  Skin: No obvious rashes/open sores  Neurologic: CN 2-12 grossly intact Psychiatric: Answered questions appropriately with normal affect  Hematologic/Lymphatic/Immunologic: No obvious bruises or sites of spontaneous bleeding  UA: 0-5 WBC/hpf, 3-10 RBC/hpf, few bacteria PVR: 11 ml  ASSESSMENT Overactive bladder - Plan: Urinalysis, Routine w reflex microscopic, BLADDER SCAN AMB NON-IMAGING, oxybutynin (DITROPAN-XL) 10 MG 24 hr tablet  Mixed stress and urge urinary incontinence - Plan: Urinalysis, Routine w reflex microscopic, BLADDER SCAN AMB NON-IMAGING, oxybutynin (DITROPAN-XL) 10 MG 24 hr tablet  Caffeine use - Plan: Urinalysis, Routine w reflex microscopic, BLADDER SCAN AMB NON-IMAGING  OSA (obstructive sleep apnea) - Plan: Urinalysis, Routine w reflex microscopic, BLADDER SCAN AMB NON-IMAGING  Microscopic hematuria - Plan: Urinalysis, Routine w reflex microscopic, BLADDER SCAN AMB NON-IMAGING  Tobacco abuse - Plan: Urinalysis, Routine w reflex microscopic, BLADDER SCAN AMB NON-IMAGING  1. OAB with urinary frequency, nocturia, urgency, urge incontinence, and nocturnal enuresis. Well managed with Oxybutynin XR 20 mg nightly and reduced caffeine intake.   2. Stress urinary incontinence. She elected expectant management.   3. Microscopic hematuria. Negative workup.  Will plan for follow up in 6 months or sooner if needed. Pt verbalized understanding and agreement. All questions were answered.  PLAN Advised the following: 1. Continue Oxybutynin XR 20 mg nightly. 2. Continue reduced caffeine intake.  3. Return in about 6 months (around 12/24/2023) for UA, PVR, & f/u with Evette Georges NP.  Orders Placed This Encounter  Procedures    Urinalysis, Routine w reflex microscopic   BLADDER SCAN AMB NON-IMAGING    It has been explained that the patient is to follow regularly with their PCP in addition to all other providers involved in their care and to follow instructions provided by these respective offices. Patient advised to contact urology clinic if any urologic-pertaining questions, concerns, new symptoms or problems arise in the interim period.  There are no Patient Instructions on file for this visit.  Electronically signed by:  Donnita Falls, FNP   06/25/23    9:27 AM

## 2023-06-25 ENCOUNTER — Encounter: Payer: Self-pay | Admitting: Urology

## 2023-06-25 ENCOUNTER — Ambulatory Visit: Payer: Medicaid Other | Admitting: Urology

## 2023-06-25 VITALS — BP 111/79 | HR 76 | Temp 97.8°F

## 2023-06-25 DIAGNOSIS — Z720 Tobacco use: Secondary | ICD-10-CM

## 2023-06-25 DIAGNOSIS — G4733 Obstructive sleep apnea (adult) (pediatric): Secondary | ICD-10-CM

## 2023-06-25 DIAGNOSIS — R3129 Other microscopic hematuria: Secondary | ICD-10-CM | POA: Diagnosis not present

## 2023-06-25 DIAGNOSIS — Z789 Other specified health status: Secondary | ICD-10-CM

## 2023-06-25 DIAGNOSIS — N3946 Mixed incontinence: Secondary | ICD-10-CM | POA: Diagnosis not present

## 2023-06-25 DIAGNOSIS — N3281 Overactive bladder: Secondary | ICD-10-CM | POA: Diagnosis not present

## 2023-06-25 LAB — URINALYSIS, ROUTINE W REFLEX MICROSCOPIC
Bilirubin, UA: NEGATIVE
Glucose, UA: NEGATIVE
Ketones, UA: NEGATIVE
Leukocytes,UA: NEGATIVE
Nitrite, UA: NEGATIVE
Protein,UA: NEGATIVE
Specific Gravity, UA: <1.005 — ABNORMAL LOW (ref 1.005–1.030)
Urobilinogen, Ur: 0.2 mg/dL (ref 0.2–1.0)
pH, UA: 6 (ref 5.0–7.5)

## 2023-06-25 LAB — MICROSCOPIC EXAMINATION: Epithelial Cells (non renal): >10 /[HPF] — AB (ref 0–10)

## 2023-06-25 LAB — BLADDER SCAN AMB NON-IMAGING: Scan Result: 11

## 2023-06-25 MED ORDER — OXYBUTYNIN CHLORIDE ER 10 MG PO TB24: 20.0000 mg | ORAL_TABLET | Freq: Every day | ORAL | 3 refills | Status: DC

## 2023-08-03 ENCOUNTER — Ambulatory Visit: Payer: Medicaid Other | Admitting: Family Medicine

## 2023-08-04 NOTE — Progress Notes (Unsigned)
   Established Patient Office Visit   Subjective  Patient ID: [name], female    DOB: 1975/11/24  Age: 48 y.o. MRN: [id number]  No chief complaint on file.   She  has a past medical history of Anxiety, Depression, GERD (gastroesophageal reflux disease), and Hypertension.  HPI  ROS    Objective:     There were no vitals taken for this visit. {Vitals History (Optional):23777}  Physical Exam   No results found for any visits on 08/05/23.  The 10-year ASCVD risk score (Arnett DK, et al., 2019) is: 6.4%    Assessment & Plan:  There are no diagnoses linked to this encounter.  No follow-ups on file.   Cruzita Lederer Newman Nip, FNP

## 2023-08-04 NOTE — Patient Instructions (Signed)

## 2023-08-05 ENCOUNTER — Encounter: Payer: Self-pay | Admitting: Family Medicine

## 2023-08-05 ENCOUNTER — Ambulatory Visit: Payer: Medicaid Other | Admitting: Family Medicine

## 2023-08-05 VITALS — BP 127/76 | HR 81 | Ht 63.0 in | Wt 207.0 lb

## 2023-08-05 DIAGNOSIS — F321 Major depressive disorder, single episode, moderate: Secondary | ICD-10-CM | POA: Diagnosis not present

## 2023-08-05 DIAGNOSIS — G8929 Other chronic pain: Secondary | ICD-10-CM | POA: Diagnosis not present

## 2023-08-05 DIAGNOSIS — M542 Cervicalgia: Secondary | ICD-10-CM

## 2023-08-05 DIAGNOSIS — I10 Essential (primary) hypertension: Secondary | ICD-10-CM | POA: Diagnosis not present

## 2023-08-05 DIAGNOSIS — L732 Hidradenitis suppurativa: Secondary | ICD-10-CM | POA: Diagnosis not present

## 2023-08-05 MED ORDER — ESCITALOPRAM OXALATE 10 MG PO TABS: 10.0000 mg | ORAL_TABLET | Freq: Every day | ORAL | 3 refills | Status: DC

## 2023-08-05 MED ORDER — DOXYCYCLINE HYCLATE 100 MG PO TABS: 100.0000 mg | ORAL_TABLET | Freq: Two times a day (BID) | ORAL | 0 refills | Status: AC

## 2023-08-05 MED ORDER — CYCLOBENZAPRINE HCL 10 MG PO TABS: 10.0000 mg | ORAL_TABLET | Freq: Two times a day (BID) | ORAL | 2 refills | Status: DC | PRN

## 2023-08-05 NOTE — Assessment & Plan Note (Signed)
Flowsheet Row Office Visit from 08/05/2023 in [facility]  PHQ-9 Total Score 15     Trial on Lexapro 10 mg once daily  We discussed several non-pharmacological approaches to managing anxiety and depression, including:  Establishing a consistent daily routine: This helps create structure and stability. Practicing mindfulness and relaxation techniques: Incorporating meditation, deep breathing exercises, or yoga to manage stress and improve emotional well-being. Engaging in regular physical activity: Aim for at least 30 minutes of exercise most days to boost mood and energy levels. Spending time outdoors: Exposure to natural light and fresh air can improve mental health. Building a support network: Encouraging social connections with friends, family, or support groups to reduce feelings of isolation. Prioritizing a balanced diet: Eating nutrient-rich foods while avoiding excessive amounts of processed foods, sugar, and unhealthy fats. Follow-up is recommended in 4-8 weeks to assess progress, with a referral to behavioral health for further support if needed.

## 2023-08-05 NOTE — Assessment & Plan Note (Signed)
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 The thickened posterior longitudinal ligament at C6 also contributes to cord flattening." I explained to the patient that non-pharmacological interventions include the application of ice or heat, rest, and recommended range of motion exercises along with gentle stretching. For pain management, Tylenol or flexeril was advised. The patient was instructed to follow up if symptoms worsen or persist. The patient verbalized understanding of the care plan, and all questions were answered.

## 2023-08-05 NOTE — Assessment & Plan Note (Addendum)
Doxycyline 100 mg twice daily x 10 days Advise patient maintain good hygiene by using antibacterial soap and avoiding tight clothing to reduce friction. Apply warm compresses to affected areas to ease pain and promote drainage. Avoid shaving or using harsh products in the affected regions, and manage triggers like sweating by staying cool and wearing breathable fabrics.

## 2023-08-05 NOTE — Assessment & Plan Note (Signed)
Vitals:   08/05/23 0902 08/05/23 0926  BP: (!) 120/58 127/76  Continue Amlodipine 10 mg once daily and Metoprolol 50 mg twice daily  Discussed with  patient to monitor their blood pressure regularly and maintain a heart-healthy diet rich in fruits, vegetables, whole grains, and low-fat dairy, while reducing sodium intake to less than 2,300 mg per day. Regular physical activity, such as 30 minutes of moderate exercise most days of the week, will help lower blood pressure and improve overall cardiovascular health. Avoiding smoking, limiting alcohol consumption, and managing stress. Take  prescribed medication, & take it as directed and avoid skipping doses. Seek emergency care if your blood pressure is (over 180/100) or you experience chest pain, shortness of breath, or sudden vision changes.Patient verbalizes understanding regarding plan of care and all questions answered.

## 2023-08-06 ENCOUNTER — Other Ambulatory Visit (INDEPENDENT_AMBULATORY_CARE_PROVIDER_SITE_OTHER): Payer: Self-pay | Admitting: Gastroenterology

## 2023-08-06 NOTE — Telephone Encounter (Signed)
Must keep upcoming appointment 08/2022 for further refills.

## 2023-08-13 ENCOUNTER — Ambulatory Visit (HOSPITAL_COMMUNITY): Payer: Medicaid Other

## 2023-08-13 ENCOUNTER — Encounter (HOSPITAL_COMMUNITY): Payer: Self-pay

## 2023-08-13 NOTE — Therapy (Signed)
[facility] Outpatient Rehabilitation at [facility] [address] [place], [place], [address] Phone: [phone]   Fax:  [phone]  Patient Details  Name: [name] MRN: [id number] Date of Birth: [birth year] Referring Provider:  No ref. provider found  Encounter Date: 08/13/2023  Pt called regarding no show to evaluation. Pt reporting she forgot about appointment.  Pt was re-scheduled to 08/20/23 @ 0715.   [name], PT 08/13/2023, 2:01 PM  [facility] Outpatient Rehabilitation at [facility] [address] [place], [place], [address] Phone: [phone]   Fax:  [phone]

## 2023-08-18 NOTE — Progress Notes (Addendum)
 Referring Physician:  [name], FNP [phone] [address] [place],  [place] [address]  Primary Physician:  [name], FNP  History of Present Illness: 08/19/23 [name] is here today with a chief complaint of acute on chronic neck pain.  Neck pain radiates down to the middle of her back and between her shoulder blades as well as down her left arm and into her hand.  She states this is a burning, sharp pain.  She states it is greatly affecting her sleep.  She adds that her pain is worse when she turns her head and she will get pain as a result.  She feels as though her left hand is weak.  She has been taking Flexeril  as well as over-the-counter pain medication which she feels as though has provided little relief.  She denies any changes to her bowel or bladder.    Duration: 4-5 years, worsening over the past several months   Quality: shooting,  Severity: 8/10  Precipitating: aggravated by turning her neck Modifying factors: made better by nothing Weakness: none Timing: constant Bowel/Bladder Dysfunction: none  Conservative measures:  Physical therapy: was referred to PT by her PCP, has not started this Multimodal medical therapy including regular antiinflammatories: flexeril  Injections: 1 injection in her neck a few years ago  Past Surgery: no spinal surgeries   The symptoms are causing a significant impact on the patient's life.   Review of Systems:  A 10 point review of systems is negative, except for the pertinent positives and negatives detailed in the HPI.  Past Medical History: Past Medical History:  Diagnosis Date   Anxiety    Phreesia 12/30/2019   Depression    GERD (gastroesophageal reflux disease)    Hypertension     Past Surgical History: Past Surgical History:  Procedure Laterality Date   BIOPSY  05/22/2022   Procedure: BIOPSY;  Surgeon: Eartha Angelia Sieving, MD;  [facility];  Service:  Gastroenterology;;   BIOPSY  11/26/2022   Procedure: BIOPSY;  Surgeon: Eartha Angelia Sieving, MD;  [facility];  Service: Gastroenterology;;   CHOLECYSTECTOMY     COLONOSCOPY WITH PROPOFOL  N/A 05/22/2022   Procedure: COLONOSCOPY WITH PROPOFOL ;  Surgeon: Eartha Angelia Sieving, MD;  [facility];  Service: Gastroenterology;  Laterality: N/A;  1215 ASA 2   ESOPHAGOGASTRODUODENOSCOPY (EGD) WITH PROPOFOL  N/A 11/26/2022   Procedure: ESOPHAGOGASTRODUODENOSCOPY (EGD) WITH PROPOFOL ;  Surgeon: Eartha Angelia Sieving, MD;  [facility];  Service: Gastroenterology;  Laterality: N/A;  1:00PM;asa 2   POLYPECTOMY  05/22/2022   Procedure: POLYPECTOMY;  Surgeon: Eartha Angelia Sieving, MD;  [facility];  Service: Gastroenterology;;   TUBAL LIGATION      Allergies: Allergies as of 08/19/2023 - Review Complete 08/05/2023  Allergen Reaction Noted   Other  09/13/2012    Medications: Outpatient Encounter Medications as of 08/19/2023  Medication Sig   albuterol  (VENTOLIN  HFA) 108 (90 Base) MCG/ACT inhaler Inhale 2 puffs into the lungs every 6 (six) hours as needed for wheezing or shortness of breath.   amLODipine  (NORVASC ) 10 MG tablet Take 1 tablet (10 mg total) by mouth daily.   aspirin EC 81 MG tablet Take 81 mg by mouth daily as needed for mild pain.   Cholecalciferol  (VITAMIN D ) 125 MCG (5000 UT) CAPS Take 5,000 Units by mouth daily.   cyclobenzaprine  (FLEXERIL ) 10 MG tablet Take 1 tablet (10 mg total) by mouth 2 (two) times  daily as needed for muscle spasms.   dicyclomine  (BENTYL ) 10 MG capsule Take 1 capsule (10 mg total) by mouth every 12 (twelve) hours as needed for spasms (abdominal pain).   escitalopram  (LEXAPRO ) 10 MG tablet Take 1 tablet (10 mg total) by mouth daily.   lubiprostone  (AMITIZA ) 8 MCG capsule TAKE 1 CAPSULE BY MOUTH TWICE DAILY WITH A MEAL   metoprolol  tartrate (LOPRESSOR ) 50 MG tablet Take 1 tablet (50 mg total) by mouth 2 (two)  times daily.   Omega-3 Fatty Acids (FISH OIL ) 1000 MG CAPS Take 2 capsules (2,000 mg total) by mouth in the morning and at bedtime.   omeprazole  (PRILOSEC) 40 MG capsule Take 1 capsule by mouth once daily   ondansetron  (ZOFRAN ) 4 MG tablet Take 1 tablet (4 mg total) by mouth every 8 (eight) hours as needed for nausea or vomiting.   oxybutynin  (DITROPAN -XL) 10 MG 24 hr tablet Take 2 tablets (20 mg total) by mouth at bedtime.   rosuvastatin  (CRESTOR ) 5 MG tablet Take 1 tablet (5 mg total) by mouth daily.   vitamin B-12 (CYANOCOBALAMIN ) 1000 MCG tablet Take 1 tablet (1,000 mcg total) by mouth daily.   No facility-administered encounter medications on file as of 08/19/2023.    Social History: Social History   Tobacco Use   Smoking status: Every Day    Current packs/day: 1.50    Average packs/day: 1.5 packs/day for 34.0 years (51.0 ttl pk-yrs)    Types: Cigarettes    Passive exposure: Current   Smokeless tobacco: Never  Vaping Use   Vaping status: Never Used  Substance Use Topics   Alcohol use: Not Currently   Drug use: No    Family Medical History: Family History  Problem Relation Age of Onset   Hypertension Father    Hypercholesterolemia Father    COPD Mother    Emphysema Mother     Physical Examination:   General: Patient is well developed, well nourished, calm, collected, and in no apparent distress. Attention to examination is appropriate.  Psychiatric: Patient is non-anxious.  Head:  Pupils equal, round, and reactive to light.  ENT:  Oral mucosa appears well hydrated.  Neck:   Supple. Decreased range of motion.  Respiratory: Patient is breathing without any difficulty.  Extremities: No edema.  Vascular: Palpable dorsal pedal pulses.  Skin:   On exposed skin, there are no abnormal skin lesions.  NEUROLOGICAL:     Awake, alert, oriented to person, place, and time.  Speech is clear and fluent. Fund of knowledge is appropriate.   Cranial Nerves: Pupils equal  round and reactive to light.  Facial tone is symmetric.  ROM of spine: Decreased range of motion in cervical spine.  Some tenderness to palpation of her cervical paraspinals.   Positive Wartenberg's sign on the left hand.  Positive Tinel of left ulnar nerve.  Positive Spurling's.   Strength: Side Biceps Triceps Deltoid Interossei Grip Wrist Ext. Wrist Flex.  R 5 5 5 5 5 5 5   L 5 5 5  4- 5 5 4     Reflexes are 2+ and symmetric at the biceps, triceps, brachioradialis. Hoffman's is absent.  Bilateral upper extremity sensation is intact with the exception of decree sensation in her left pinky finger. Gait is normal.   No difficulty with tandem gait.   No evidence of dysmetria noted.  Medical Decision Making  Imaging: MRI Cervical Spine (02/2019):  IMPRESSION: Spondylosis appears worst at C6-7 where a broad-based disc bulge flattens the cord and uncovertebral  disease causes moderate to moderately severe bilateral foraminal narrowing. Posterior longitudinal ligament posterior to C6 is thickened and contributes to cord flattening.   Shallow disc bulge at C5-6 mildly flattens the ventral cord.   Broad-based central protrusion mildly indents the ventral cord at C4-5.   Small central disc protrusion at C3-4 contacts the cord without central canal stenosis.  I have personally reviewed the images and agree with the above interpretation.  Assessment and Plan: [name] is a pleasant 48 y.o. female is here today with a chief complaint of acute on chronic neck pain.  Neck pain radiates down to the middle of her back and between her shoulder blades as well as down her left arm and into her hand.  She states this is a burning, sharp pain.  She states it is greatly affecting her sleep as also had some difficulty walking.  She adds that her pain is worse when she turns her head and she will get pain as a result.  She feels as though her left hand is weak.  She has been taking Flexeril  as well  as over-the-counter pain medication which she feels as though has provided little relief.  She denies any changes to her bowel or bladder.  On examination,Decreased range of motion in cervical spine.  Some tenderness to palpation of her cervical paraspinals.Positive Wartenberg's sign on the left hand.  Positive Tinel of left ulnar nerve.  Positive Spurling's.  No Hoffmann's.Bilateral upper extremity sensation is intact with the exception of decree sensation in her left pinky finger.  MRI from 2020 was reviewed which did show spondylosis with a broad-based disc bulge at C6-7 with moderate to severe bilateral foraminal narrowing as well as ventral spinal cord flattening.  At the time there is also a broad-based central protrusion mildly indenting the ventral cord at C4-5.  She did not proceed with surgery due to lack of insurance.  She does currently smoke 1 pack a day.  Pleasure to see patient in clinic today.  Considering both acute cervical radiculopathy in addition to a possible ulnar neuropathy to left upper extremity.  Plan for cervical x-rays today as well as an MRI of her cervical spine in the future.  Advised patient to continue with physical therapy.  Medrol  Dosepak and gabapentin  was also sent to her pharmacy to try to help relieve her pain.  Will review MRI once complete.  She was encouraged to reach out to us  for any acute changes, questions or concerns.   Thank you for involving me in the care of this patient.   Lyle Decamp, PA-C Dept. of Neurosurgery

## 2023-08-19 ENCOUNTER — Ambulatory Visit
Admission: RE | Admit: 2023-08-19 | Discharge: 2023-08-19 | Disposition: A | Discharge status: Home / Self Care | Payer: Medicaid Other | Source: Ambulatory Visit | Attending: Physician Assistant | Admitting: Physician Assistant

## 2023-08-19 ENCOUNTER — Ambulatory Visit: Payer: Medicaid Other | Admitting: Physician Assistant

## 2023-08-19 VITALS — BP 126/76 | Ht 63.0 in | Wt 208.0 lb

## 2023-08-19 DIAGNOSIS — R200 Anesthesia of skin: Secondary | ICD-10-CM | POA: Diagnosis not present

## 2023-08-19 DIAGNOSIS — R202 Paresthesia of skin: Secondary | ICD-10-CM | POA: Diagnosis not present

## 2023-08-19 DIAGNOSIS — M5010 Cervical disc disorder with radiculopathy, unspecified cervical region: Secondary | ICD-10-CM

## 2023-08-19 DIAGNOSIS — M50123 Cervical disc disorder at C6-C7 level with radiculopathy: Secondary | ICD-10-CM | POA: Diagnosis not present

## 2023-08-19 DIAGNOSIS — M4802 Spinal stenosis, cervical region: Secondary | ICD-10-CM | POA: Diagnosis not present

## 2023-08-19 DIAGNOSIS — M4302 Spondylolysis, cervical region: Secondary | ICD-10-CM | POA: Diagnosis not present

## 2023-08-19 DIAGNOSIS — R531 Weakness: Secondary | ICD-10-CM

## 2023-08-19 DIAGNOSIS — M47812 Spondylosis without myelopathy or radiculopathy, cervical region: Secondary | ICD-10-CM | POA: Diagnosis not present

## 2023-08-19 DIAGNOSIS — M5030 Other cervical disc degeneration, unspecified cervical region: Secondary | ICD-10-CM | POA: Diagnosis not present

## 2023-08-19 MED ORDER — GABAPENTIN 300 MG PO CAPS: 300.0000 mg | ORAL_CAPSULE | Freq: Three times a day (TID) | ORAL | 1 refills | Status: DC

## 2023-08-19 MED ORDER — METHYLPREDNISOLONE 4 MG PO TBPK: ORAL_TABLET | ORAL | 0 refills | Status: DC

## 2023-08-20 ENCOUNTER — Other Ambulatory Visit: Payer: Self-pay

## 2023-08-20 ENCOUNTER — Ambulatory Visit (HOSPITAL_COMMUNITY): Payer: Self-pay | Attending: Family Medicine

## 2023-08-20 DIAGNOSIS — M542 Cervicalgia: Secondary | ICD-10-CM | POA: Insufficient documentation

## 2023-08-20 DIAGNOSIS — G8929 Other chronic pain: Secondary | ICD-10-CM | POA: Insufficient documentation

## 2023-08-20 NOTE — Therapy (Signed)
 OUTPATIENT PHYSICAL THERAPY CERVICAL EVALUATION   Patient Name: [name] MRN: [id number] DOB:05-Oct-1975, 48 y.o., female Today's Date: 08/20/2023  END OF SESSION:  PT End of Session - 08/20/23 0809     Visit Number 1    Number of Visits 9    Date for PT Re-Evaluation 10/15/23    Authorization Type [place] Medicaid (requested 8 visits)    Progress Note Due on Visit 4    PT Start Time 0715    PT Stop Time 0800    PT Time Calculation (min) 45 min    Activity Tolerance Patient tolerated treatment well    Behavior During Therapy [facility] for tasks assessed/performed             Past Medical History:  Diagnosis Date   Anxiety    Phreesia 12/30/2019   Depression    GERD (gastroesophageal reflux disease)    Hypertension    Past Surgical History:  Procedure Laterality Date   BIOPSY  05/22/2022   Procedure: BIOPSY;  Surgeon: Eartha Angelia Sieving, MD;  [facility];  Service: Gastroenterology;;   BIOPSY  11/26/2022   Procedure: BIOPSY;  Surgeon: Eartha Angelia Sieving, MD;  [facility];  Service: Gastroenterology;;   CHOLECYSTECTOMY     COLONOSCOPY WITH PROPOFOL  N/A 05/22/2022   Procedure: COLONOSCOPY WITH PROPOFOL ;  Surgeon: Eartha Angelia Sieving, MD;  [facility];  Service: Gastroenterology;  Laterality: N/A;  1215 ASA 2   ESOPHAGOGASTRODUODENOSCOPY (EGD) WITH PROPOFOL  N/A 11/26/2022   Procedure: ESOPHAGOGASTRODUODENOSCOPY (EGD) WITH PROPOFOL ;  Surgeon: Eartha Angelia Sieving, MD;  [facility];  Service: Gastroenterology;  Laterality: N/A;  1:00PM;asa 2   POLYPECTOMY  05/22/2022   Procedure: POLYPECTOMY;  Surgeon: Eartha Angelia Sieving, MD;  [facility];  Service: Gastroenterology;;   TUBAL LIGATION     Patient Active Problem List   Diagnosis Date Noted   Hidradenitis suppurativa 08/05/2023   Depression, major, single episode, moderate (HCC) 08/05/2023   Constipation 06/09/2023   Mixed stress and  urge urinary incontinence 04/07/2023   Neck pain 04/01/2023   Early satiety 03/01/2023   Overactive bladder 11/05/2022   Diarrhea 05/05/2022   Loss of weight 05/05/2022   Vitamin B12 deficiency 04/12/2020   Vitamin D  deficiency 04/12/2020   Dizziness 04/12/2020   HTN (hypertension) 12/30/2019   Tobacco abuse 12/30/2019   GERD (gastroesophageal reflux disease) 12/30/2019   Spondylosis 12/30/2019    PCP: [name] FNP  REFERRING PROVIDER: [name], FNP  REFERRING DIAG: [id number] (ICD-10-CM) - Chronic neck pain  THERAPY DIAG:  Cervicalgia  Rationale for Evaluation and Treatment: Rehabilitation  ONSET DATE: 2020  SUBJECTIVE:  SUBJECTIVE STATEMENT: EVAL: Arrives to the clinic with neck pain (see below). Used to have a tingling sensation on the L hand but now it's gone. Patient states that she cannot notice if the L hand is weaker than the R. Condition started in 2020 when patient fell at work backwards and lost consciousness. Patient was rushed to the ER with the L UE being swollen and unable to move the L UE. Patient was thought to have stroke but was negative for CVA. Since then, the neck started to hurt. Patient had received epidural injections on the neck which only helped a little. Patient had to stop the injections due to insurance/financial issues. Since then, condition has gotten worse. Patient went to to the neurologist yesterday and was given meds. MD also told her to try outpatient PT evaluation and management. Hand dominance: Right  PERTINENT HISTORY:    PAIN:  Are you having pain? Yes: NPRS scale: 5/10 Pain location: neck, upper back, up to the back of L hand Pain description: constant, sharp and shooting Aggravating factors: night,  bending over, turning the neck R/L Relieving factors: heating pad, hot showers  PRECAUTIONS: None  RED FLAGS: None     WEIGHT BEARING RESTRICTIONS: No  FALLS:  Has patient fallen in last 6 months? No  LIVING ENVIRONMENT: Lives with: lives with their daughter Lives in: House/apartment Stairs: Yes: External: 5 steps; can reach both Has following equipment at home: None  OCCUPATION: unemployed  PLOF: Independent and Independent with basic ADLs  PATIENT GOALS: to relieve pain and tension on my neck  NEXT MD VISIT: March 2025  OBJECTIVE:  Note: Objective measures were completed at Evaluation unless otherwise noted.  DIAGNOSTIC FINDINGS:  02/28/2019 MRI CERVICAL SPINE WITHOUT CONTRAST   TECHNIQUE: Multiplanar, multisequence MR imaging of the cervical spine was performed. No intravenous contrast was administered.   COMPARISON:  None.   FINDINGS: Alignment: Maintained with straightening of lordosis.   Vertebrae: No fracture, evidence of discitis, or bone lesion. Degenerative endplate signal change in C5 eccentric to the right noted.   Cord: Normal signal throughout.   Posterior Fossa, vertebral arteries, paraspinal tissues: Negative.   Disc levels:   C2-3: Shallow disc bulge without stenosis.   C3-4: Small central disc protrusion contacts the ventral cord but the central canal and foramina remain open.   C4-5: Broad-based central protrusion mildly indents the ventral cord. Foramina are open.   C5-6: Show broad-based disc bulge mildly flattens the ventral cord. Mild bilateral foraminal narrowing is present.   C6-7: Diffuse broad-based disc bulge flattens the cord. Thickening of the posterior longitudinal ligament posterior to C6 also contributes to cord flattening. Moderate to moderately severe bilateral foraminal narrowing due to uncovertebral disease is seen.   C7-T1: Shallow disc bulge.   IMPRESSION: Spondylosis appears worst at C6-7 where a  broad-based disc bulge flattens the cord and uncovertebral disease causes moderate to moderately severe bilateral foraminal narrowing. Posterior longitudinal ligament posterior to C6 is thickened and contributes to cord flattening.   Shallow disc bulge at C5-6 mildly flattens the ventral cord.   Broad-based central protrusion mildly indents the ventral cord at C4-5.   Small central disc protrusion at C3-4 contacts the cord without central canal stenosis.    PATIENT SURVEYS:  NDI 24/50 = 48%  COGNITION: Overall cognitive status: Within functional limits for tasks assessed  SENSATION: Light touch: Impaired on the L C6, C8 dermatome, impaired on posterior aspect of L hand  POSTURE: rounded shoulders and forward head  PALPATION: Grade 2 tenderness and moderate spasm on B upper trapezius Moderate restriction on B pectoralis, suboccipitals, upper trapezius, and lev scap   CERVICAL ROM:   Active ROM A/PROM (deg) eval  Flexion 10  Extension 40  Right lateral flexion   Left lateral flexion   Right rotation 40  Left rotation 50   (Blank rows = not tested)  UPPER EXTREMITY ROM:  Active ROM Right eval Left eval  Shoulder flexion [facility]  Shoulder extension    Shoulder abduction [facility]  Shoulder adduction [facility]  Shoulder extension    Shoulder internal rotation    Shoulder external rotation    Elbow flexion [facility] WFL  Elbow extension [facility] WFL  Wrist flexion [facility] WFL  Wrist extension [facility] WFL  Wrist ulnar deviation    Wrist radial deviation    Wrist pronation    Wrist supination     (Blank rows = not tested)  UPPER EXTREMITY MMT:  MMT Right eval Left eval  Shoulder flexion 5 4+  Shoulder extension    Shoulder abduction 5 4+  Shoulder adduction    Shoulder extension    Shoulder internal rotation 5 4+  Shoulder external rotation 5 4+  Middle trapezius 4 4-  Lower trapezius 4 3+  Elbow flexion 5 5  Elbow extension 5 5  Wrist flexion 5 5  Wrist extension 5  4  Wrist ulnar deviation    Wrist radial deviation    Wrist pronation    Wrist supination    Grip strength     (Blank rows = not tested)  Grip (lbs) Right eval Left eval  Trial 1 60 40  Trial 2 70 30  Trial 3 65 35  Average 65 35   (Blank rows = not tested)  CERVICAL SPECIAL TESTS:  Upper limb tension test (ULTT): (+) R radial nerve, Spurling's test: (+) R, and Distraction test: worsened the symptoms on the fingers    TREATMENT DATE:  08/20/23 Evaluation and patient education done                                                                                                                               PATIENT EDUCATION:  Education details: Educated on the pathoanatomy of cervical pain. Educated on the goals and course of rehab.  Person educated: Patient Education method: Explanation Education comprehension: verbalized understanding  HOME EXERCISE PROGRAM: None provided to date  ASSESSMENT:  CLINICAL IMPRESSION: EVAL: Patient is a 48 y.o. female who was seen today for physical therapy evaluation and treatment for chronic neck pain. Patient's condition is further defined by difficulty bending over and turning the head due to pain, weakness, and decreased soft tissue extensibility. Skilled PT is required to address the impairments and functional limitations listed below.    OBJECTIVE IMPAIRMENTS: decreased mobility, decreased ROM, decreased strength, increased muscle spasms, impaired flexibility, impaired sensation, postural dysfunction, and pain.   ACTIVITY LIMITATIONS: carrying, lifting, bending,  and sleeping  PARTICIPATION LIMITATIONS: meal prep, cleaning, laundry, driving, shopping, community activity, and yard work  PERSONAL FACTORS: Time since onset of injury/illness/exacerbation are also affecting patient's functional outcome.   REHAB POTENTIAL: Fair    CLINICAL DECISION MAKING: Stable/uncomplicated  EVALUATION COMPLEXITY: Low   GOALS: Goals reviewed with  patient? Yes  SHORT TERM GOALS: Target date: 09/17/23  Pt will demonstrate indep in HEP to facilitate carry-over of skilled services and improve functional outcomes Goal status: INITIAL  LONG TERM GOALS: Target date: 10/15/23  Pt will demonstrate a decrease in NDI score by 19 % to demonstrate significant improvement in ADLs  Baseline: 48% Goal status: INITIAL  2.  Pt will demonstrate increase in cervical flex and rot by 10 deg  to facilitate ease in ADLs Baseline: see above Goal status: INITIAL  3.  Pt will demonstrate increase in UE strength to 4/5 to facilitate ease in ADLs Baseline: 3+/5 Goal status: INITIAL  4.  Pt will demonstrate an increase in L grip strength by 10-15 lbs to facilitate ease in ADLs Baseline: 35 lbs Goal status: INITIAL  5.  Pt will be able to bend and turn the head with mild pain (3-4/10) to facilitate ease in ADLs Baseline: 5/10 Goal status: INITIAL   PLAN:  PT FREQUENCY: 1x/week  PT DURATION: 8 weeks  PLANNED INTERVENTIONS: 97164- PT Re-evaluation, 97110-Therapeutic exercises, 97530- Therapeutic activity, 97112- Neuromuscular re-education, 97535- Self Care, 02859- Manual therapy, 97116- Gait training, 97014- Electrical stimulation (unattended), Patient/Family education, Taping, Dry Needling, Cryotherapy, and Moist heat  PLAN FOR NEXT SESSION: Provide HEP. Begin cervical strengthening and mobility activities. Add nerve glides and upper thoracic mobility.   Vinie CROME. Saylor Sheckler, PT, DPT, OCS Board-Certified Clinical Specialist in Orthopedic PT PT Compact Privilege # ([place]): [id number] T 08/20/2023, 9:10 AM   Managed Medicaid Authorization Request  Visit Dx Codes: M54.2  Functional Tool Score: NDI = 48%  For all possible CPT codes, reference the Planned Interventions line above.     Check all conditions that are expected to impact treatment: {Conditions expected to impact treatment:None of these apply   If treatment provided at initial evaluation, no  treatment charged due to lack of authorization.

## 2023-08-25 ENCOUNTER — Other Ambulatory Visit (INDEPENDENT_AMBULATORY_CARE_PROVIDER_SITE_OTHER): Payer: Self-pay | Admitting: Gastroenterology

## 2023-08-25 NOTE — Telephone Encounter (Signed)
Must keep 09/08/2023 appointment for further refills.

## 2023-08-27 ENCOUNTER — Other Ambulatory Visit: Payer: Self-pay | Admitting: Family Medicine

## 2023-08-27 ENCOUNTER — Ambulatory Visit (HOSPITAL_COMMUNITY): Payer: Medicaid Other | Admitting: Physical Therapy

## 2023-08-27 DIAGNOSIS — I10 Essential (primary) hypertension: Secondary | ICD-10-CM

## 2023-08-27 DIAGNOSIS — M542 Cervicalgia: Secondary | ICD-10-CM

## 2023-08-27 DIAGNOSIS — G8929 Other chronic pain: Secondary | ICD-10-CM | POA: Diagnosis not present

## 2023-08-27 NOTE — Therapy (Addendum)
 OUTPATIENT PHYSICAL THERAPY CERVICAL TREATMENT   Patient Name: [name] MRN: [id number] DOB:Nov 24, 1975, 48 y.o., female Today's Date: 08/27/2023  END OF SESSION:  PT End of Session - 08/27/23 0740     Visit Number 2    Number of Visits 9    Date for PT Re-Evaluation 10/15/23    Authorization Type [place] [employer]    Authorization Time Period 6 visits approved 2/6-10/18/23    Authorization - Visit Number 1    Authorization - Number of Visits 6    Progress Note Due on Visit 6    PT Start Time 0719    PT Stop Time 0757    PT Time Calculation (min) 38 min    Activity Tolerance Patient tolerated treatment well    Behavior During Therapy [facility] for tasks assessed/performed              Past Medical History:  Diagnosis Date   Anxiety    [employer]reesia 12/30/2019   Depression    GERD (gastroesophageal reflux disease)    Hypertension    Past Surgical History:  Procedure Laterality Date   BIOPSY  05/22/2022   Procedure: BIOPSY;  Surgeon: Dolores Frame, MD;  [facility];  Service: Gastroenterology;;   BIOPSY  11/26/2022   Procedure: BIOPSY;  Surgeon: Dolores Frame, MD;  [facility];  Service: Gastroenterology;;   CHOLECYSTECTOMY     COLONOSCOPY WITH PROPOFOL N/A 05/22/2022   Procedure: COLONOSCOPY WITH PROPOFOL;  Surgeon: Dolores Frame, MD;  [facility];  Service: Gastroenterology;  Laterality: N/A;  1215 ASA 2   ESOPHAGOGASTRODUODENOSCOPY (EGD) WITH PROPOFOL N/A 11/26/2022   Procedure: ESOPHAGOGASTRODUODENOSCOPY (EGD) WITH PROPOFOL;  Surgeon: Dolores Frame, MD;  [facility];  Service: Gastroenterology;  Laterality: N/A;  1:00PM;asa 2   POLYPECTOMY  05/22/2022   Procedure: POLYPECTOMY;  Surgeon: Dolores Frame, MD;  [facility]: [facility];  Service: Gastroenterology;;   TUBAL LIGATION     Patient Active Problem List   Diagnosis Date Noted   Hidradenitis suppurativa 08/05/2023    Depression, major, single episode, moderate (HCC) 08/05/2023   Constipation 06/09/2023   Mixed stress and urge urinary incontinence 04/07/2023   Neck pain 04/01/2023   Early satiety 03/01/2023   Overactive bladder 11/05/2022   Diarrhea 05/05/2022   Loss of weight 05/05/2022   Vitamin B12 deficiency 04/12/2020   Vitamin D deficiency 04/12/2020   Dizziness 04/12/2020   HTN (hypertension) 12/30/2019   Tobacco abuse 12/30/2019   GERD (gastroesophageal reflux disease) 12/30/2019   Spondylosis 12/30/2019    PCP: Rica Records FNP  REFERRING PROVIDER: Rica Records, FNP  REFERRING DIAG: [phone] (ICD-10-CM) - Chronic neck pain  THERAPY DIAG:  Cervicalgia  Rationale for Evaluation and Treatment: Rehabilitation  ONSET DATE: 2020  SUBJECTIVE:  SUBJECTIVE STATEMENT: Pt reports her pain is currently a 6/10 but states she hasn't taken her medicine this morning.  Reports she was having trouble sleeping due ot pain until MD put her on 2 new meds.   EVAL: Arrives to the clinic with neck pain (see below). Used to have a tingling sensation on the L hand but now it's gone. Patient states that she cannot notice if the L hand is weaker than the R. Condition started in 2020 when patient fell at work backwards and lost consciousness. Patient was rushed to the ER with the L UE being swollen and unable to move the L UE. Patient was thought to have stroke but was negative for CVA. Since then, the neck started to hurt. Patient had received epidural injections on the neck which only helped a little. Patient had to stop the injections due to insurance/financial issues. Since then, condition has gotten worse. Patient went to to the neurologist yesterday and was given meds. MD also told  her to try outpatient PT evaluation and management. Hand dominance: Right Pt has been out of work since last year due to c-diff issues. PERTINENT HISTORY:    PAIN:  Are you having pain? Yes: NPRS scale: 5/10 Pain location: neck, upper back, up to the back of L hand Pain description: constant, sharp and shooting Aggravating factors: night, bending over, turning the neck R/L Relieving factors: heating pad, hot showers  PRECAUTIONS: None  RED FLAGS: None     WEIGHT BEARING RESTRICTIONS: No  FALLS:  Has patient fallen in last 6 months? No  LIVING ENVIRONMENT: Lives with: lives with their daughter Lives in: House/apartment Stairs: Yes: External: 5 steps; can reach both Has following equipment at home: None  OCCUPATION: unemployed  PLOF: Independent and Independent with basic ADLs  PATIENT GOALS: "to relieve pain and tension on my neck"  NEXT MD VISIT: March 2025  OBJECTIVE:  Note: Objective measures were completed at Evaluation unless otherwise noted.  DIAGNOSTIC FINDINGS:  02/28/2019 MRI CERVICAL SPINE WITHOUT CONTRAST   TECHNIQUE: Multiplanar, multisequence MR imaging of the cervical spine was performed. No intravenous contrast was administered.   COMPARISON:  None.   FINDINGS: Alignment: Maintained with straightening of lordosis.   Vertebrae: No fracture, evidence of discitis, or bone lesion. Degenerative endplate signal change in C5 eccentric to the right noted.   Cord: Normal signal throughout.   Posterior Fossa, vertebral arteries, paraspinal tissues: Negative.   Disc levels:   C2-3: Shallow disc bulge without stenosis.   C3-4: Small central disc protrusion contacts the ventral cord but the central canal and foramina remain open.   C4-5: Broad-based central protrusion mildly indents the ventral cord. Foramina are open.   C5-6: Show broad-based disc bulge mildly flattens the ventral cord. Mild bilateral foraminal narrowing is present.   C6-7:  Diffuse broad-based disc bulge flattens the cord. Thickening of the posterior longitudinal ligament posterior to C6 also contributes to cord flattening. Moderate to moderately severe bilateral foraminal narrowing due to uncovertebral disease is seen.   C7-T1: Shallow disc bulge.   IMPRESSION: Spondylosis appears worst at C6-7 where a broad-based disc bulge flattens the cord and uncovertebral disease causes moderate to moderately severe bilateral foraminal narrowing. Posterior longitudinal ligament posterior to C6 is thickened and contributes to cord flattening.   Shallow disc bulge at C5-6 mildly flattens the ventral cord.   Broad-based central protrusion mildly indents the ventral cord at C4-5.   Small central disc protrusion at C3-4 contacts the cord without central  canal stenosis.    PATIENT SURVEYS:  NDI 24/50 = 48%  COGNITION: Overall cognitive status: Within functional limits for tasks assessed  SENSATION: Light touch: Impaired on the L C6, C8 dermatome, impaired on posterior aspect of L hand  POSTURE: rounded shoulders and forward head  PALPATION: Grade 2 tenderness and moderate spasm on B upper trapezius Moderate restriction on B pectoralis, suboccipitals, upper trapezius, and lev scap   CERVICAL ROM:   Active ROM A/PROM (deg) eval  Flexion 10  Extension 40  Right lateral flexion   Left lateral flexion   Right rotation 40  Left rotation 50   (Blank rows = not tested)  UPPER EXTREMITY ROM:  Active ROM Right eval Left eval  Shoulder flexion [facility]  Shoulder extension    Shoulder abduction [facility]  Shoulder adduction [facility]  Shoulder extension    Shoulder internal rotation    Shoulder external rotation    Elbow flexion WFL WFL  Elbow extension [facility] WFL  Wrist flexion [facility] WFL  Wrist extension [facility] WFL  Wrist ulnar deviation    Wrist radial deviation    Wrist pronation    Wrist supination     (Blank rows = not tested)  UPPER EXTREMITY  MMT:  MMT Right eval Left eval  Shoulder flexion 5 4+  Shoulder extension    Shoulder abduction 5 4+  Shoulder adduction    Shoulder extension    Shoulder internal rotation 5 4+  Shoulder external rotation 5 4+  Middle trapezius 4 4-  Lower trapezius 4 3+  Elbow flexion 5 5  Elbow extension 5 5  Wrist flexion 5 5  Wrist extension 5 4  Wrist ulnar deviation    Wrist radial deviation    Wrist pronation    Wrist supination    Grip strength     (Blank rows = not tested)  Grip (lbs) Right eval Left eval  Trial 1 60 40  Trial 2 70 30  Trial 3 65 35  Average 65 35   (Blank rows = not tested)  CERVICAL SPECIAL TESTS:  Upper limb tension test (ULTT): (+) R radial nerve, Spurling's test: (+) R, and Distraction test: worsened the symptoms on the fingers    TREATMENT DATE:  08/27/23 Seated:  cervical ROM all motions 5X each direction Scapular Retraction 10X each 5" holds Neck Retraction  5X  5 sec hold Shoulder External and Internal Rotation AROM   10X Upper Trap Stretch  3X  20 sec hold Thoracic excursions with UE movements 5 reps each  08/20/23 Evaluation and patient education done                                                                                                                               PATIENT EDUCATION:  Education details: Educated on the pathoanatomy of cervical pain. Educated on the goals and course of rehab.  Person educated: Patient Education method: Explanation Education comprehension:  verbalized understanding  HOME EXERCISE PROGRAM: None provided to date  Access Code: [email or web address] URL: [email or web address] Date: 08/27/2023 Prepared by: Emeline Gins  Exercises - Seated Scapular Retraction with External Rotation  - 2 x daily - 7 x weekly - 10 reps - 5 sec hold - Neck Retraction  - 2 x daily - 7 x weekly - 5 reps - 5 sec hold - Standing Shoulder External and Internal Rotation AROM  - 2 x daily - 7 x weekly - 10 reps - Seated  Upper Trap Stretch  - 2 x daily - 7 x weekly - 3 reps - 20 sec hold Cervical AROM exercises each direction 5 reps each Thoracic excursions with UE movements 5 reps each  ASSESSMENT:  CLINICAL IMPRESSION:  Reviewed goals and POC moving forward. Began with cervical and thoracic AROM as well as postural strengthening and education. Pt remains tense with movement with cues to relax supporting mm.  Pt completed all exercises without symptoms or complaints. Did report some tingling into fingers with upper trap stretch but otherwise no pain.  Established HEP to include exercises instructed today.  Pt will continue to benefit from skilled therapy.  EVAL: Patient is a 48 y.o. female who was seen today for physical therapy evaluation and treatment for chronic neck pain. Patient's condition is further defined by difficulty bending over and turning the head due to pain, weakness, and decreased soft tissue extensibility. Skilled PT is required to address the impairments and functional limitations listed below.    OBJECTIVE IMPAIRMENTS: decreased mobility, decreased ROM, decreased strength, increased muscle spasms, impaired flexibility, impaired sensation, postural dysfunction, and pain.   ACTIVITY LIMITATIONS: carrying, lifting, bending, and sleeping  PARTICIPATION LIMITATIONS: meal prep, cleaning, laundry, driving, shopping, community activity, and yard work  PERSONAL FACTORS: Time since onset of injury/illness/exacerbation are also affecting patient's functional outcome.   REHAB POTENTIAL: Fair    CLINICAL DECISION MAKING: Stable/uncomplicated  EVALUATION COMPLEXITY: Low   GOALS: Goals reviewed with patient? Yes  SHORT TERM GOALS: Target date: 09/17/23  Pt will demonstrate indep in HEP to facilitate carry-over of skilled services and improve functional outcomes Goal status: IN PROGRESS  LONG TERM GOALS: Target date: 10/15/23  Pt will demonstrate a decrease in NDI score by 19 % to demonstrate  significant improvement in ADLs  Baseline: 48% Goal status: IN PROGRESS  2.  Pt will demonstrate increase in cervical flex and rot by 10 deg  to facilitate ease in ADLs Baseline: see above Goal status: IN PROGRESS  3.  Pt will demonstrate increase in UE strength to 4/5 to facilitate ease in ADLs Baseline: 3+/5 Goal status: IN PROGRESS  4.  Pt will demonstrate an increase in L grip strength by 10-15 lbs to facilitate ease in ADLs Baseline: 35 lbs Goal status: IN PROGRESS  5.  Pt will be able to bend and turn the head with mild pain (3-4/10) to facilitate ease in ADLs Baseline: 5/10 Goal status: IN PROGRESS   PLAN:  PT FREQUENCY: 1x/week  PT DURATION: 8 weeks  PLANNED INTERVENTIONS: 97164- PT Re-evaluation, 97110-Therapeutic exercises, 97530- Therapeutic activity, 97112- Neuromuscular re-education, 97535- Self Care, 65784- Manual therapy, 97116- Gait training, 97014- Electrical stimulation (unattended), Patient/Family education, Taping, Dry Needling, Cryotherapy, and Moist heat  PLAN FOR NEXT SESSION: Progress cervical strengthening and mobility activities. Add nerve glides next session.  Lurena Nida, PTA/CLT [facility] Ph: [phone]   Lurena Nida, PTA 08/27/2023, 7:56 AM

## 2023-08-28 ENCOUNTER — Ambulatory Visit
Admission: RE | Admit: 2023-08-28 | Discharge: 2023-08-28 | Disposition: A | Discharge status: Home / Self Care | Payer: Medicaid Other | Source: Ambulatory Visit | Attending: Physician Assistant | Admitting: Physician Assistant

## 2023-08-28 DIAGNOSIS — M5010 Cervical disc disorder with radiculopathy, unspecified cervical region: Secondary | ICD-10-CM | POA: Diagnosis not present

## 2023-08-28 DIAGNOSIS — M4802 Spinal stenosis, cervical region: Secondary | ICD-10-CM | POA: Diagnosis not present

## 2023-08-28 DIAGNOSIS — R531 Weakness: Secondary | ICD-10-CM | POA: Diagnosis not present

## 2023-08-28 DIAGNOSIS — M5011 Cervical disc disorder with radiculopathy,  high cervical region: Secondary | ICD-10-CM | POA: Diagnosis not present

## 2023-08-28 DIAGNOSIS — M4722 Other spondylosis with radiculopathy, cervical region: Secondary | ICD-10-CM | POA: Diagnosis not present

## 2023-09-03 ENCOUNTER — Encounter (HOSPITAL_COMMUNITY): Payer: Medicaid Other

## 2023-09-04 ENCOUNTER — Other Ambulatory Visit (INDEPENDENT_AMBULATORY_CARE_PROVIDER_SITE_OTHER): Payer: Self-pay | Admitting: Gastroenterology

## 2023-09-04 NOTE — Telephone Encounter (Signed)
 Must keep 09/08/2023 appointment for further refills.

## 2023-09-08 ENCOUNTER — Ambulatory Visit (INDEPENDENT_AMBULATORY_CARE_PROVIDER_SITE_OTHER): Payer: Medicaid Other | Admitting: Gastroenterology

## 2023-09-08 ENCOUNTER — Encounter (INDEPENDENT_AMBULATORY_CARE_PROVIDER_SITE_OTHER): Payer: Self-pay | Admitting: Gastroenterology

## 2023-09-08 VITALS — BP 116/77 | HR 85 | Temp 97.5°F | Ht 63.0 in | Wt 211.4 lb

## 2023-09-08 DIAGNOSIS — K581 Irritable bowel syndrome with constipation: Secondary | ICD-10-CM | POA: Diagnosis not present

## 2023-09-08 DIAGNOSIS — Z8719 Personal history of other diseases of the digestive system: Secondary | ICD-10-CM | POA: Diagnosis not present

## 2023-09-08 DIAGNOSIS — K219 Gastro-esophageal reflux disease without esophagitis: Secondary | ICD-10-CM | POA: Diagnosis not present

## 2023-09-08 DIAGNOSIS — R110 Nausea: Secondary | ICD-10-CM

## 2023-09-08 DIAGNOSIS — R112 Nausea with vomiting, unspecified: Secondary | ICD-10-CM | POA: Insufficient documentation

## 2023-09-08 MED ORDER — LUBIPROSTONE 24 MCG PO CAPS: 24.0000 ug | ORAL_CAPSULE | Freq: Two times a day (BID) | ORAL | 1 refills | Status: DC

## 2023-09-08 NOTE — Progress Notes (Addendum)
 Referring Provider: Wylene Men* Primary Care Physician:  Rica Records, FNP Primary GI Physician: Dr. Levon Hedger   Chief Complaint  Patient presents with   Follow-up    Patient here today for a follow up on her constipation. She reports she is doing much better while on lubiprostone (AMITIZA) 8 MCG capsule bid. She is still taking zofran prn, also takes bentyl 10 mg prn.   HPI:   [name] is a 48 y.o. female with past medical history of  anxiety, depression, HTN, GERD, IBS-C  Patient presenting today for follow up of IBS-C and nausea   Last seen November 2024, at that time still having constipation, did bowel prep prescribed to her which helped but constipation recurred thereafter. She reports very hard stools, going every 3-4 days. Having ocasional vomiting. GERD feels well managed on omeprazole 40mg  daily.   Recommended amitiza BID, good water intake, diet high in fruits, veggies, whole grains and continue omeprazole 40mg  daily  Present: States she is feeling better since start amitiza BID. She is having a BM usually daily. Sometimes stools are still harder and hurts some to pass. She is drinking a lot of water. She feels she is able to eat better than she did before. She has some abdominal pain that comes and goes. She notices some intermittent soreness around her umbilicus. No melena or rectal bleeding. Using bentyl for her abdominal discomfort, taking this maybe 5x per week which helps her pain.   Still having some nausea, no vomiting. Feels this is about the same as previous. She has been using zofran, she usually takes it in the mornings and again at night when she eats dinner.   GERD symptoms well managed on omeprazole 40mg  daily. No breakthrough, dysphagia or odynophagia.   Last Colonoscopy: 05/2022 - Two 3 to 5 mm polyps in the descending colon and                            in the ascending colon, removed with a cold snare.                             Resected and retrieved.                           - The rest of the examined colon is normal.                            Biopsied-normal random biopsies                           - Non-bleeding internal hemorrhoids. (1 TA, 1 hyperplastic polyp, random biopsies normal)    Last Endoscopy:-11/2022 2 cm hiatal hernia.                           - Normal stomach. Biopsied.                           - Normal examined duodenum. Biopsied-normal, no celiac or h pylori   Repeat colonoscopy 7 years   Past Medical History:  Diagnosis Date   Anxiety    [employer] 12/30/2019   Depression    GERD (  gastroesophageal reflux disease)    Hypertension     Past Surgical History:  Procedure Laterality Date   BIOPSY  05/22/2022   Procedure: BIOPSY;  Surgeon: Dolores Frame, MD;  [facility]: [facility];  Service: Gastroenterology;;   BIOPSY  11/26/2022   Procedure: BIOPSY;  Surgeon: Dolores Frame, MD;  Location: [facility];  Service: Gastroenterology;;   CHOLECYSTECTOMY     COLONOSCOPY WITH PROPOFOL N/A 05/22/2022   Procedure: COLONOSCOPY WITH PROPOFOL;  Surgeon: Dolores Frame, MD;  Location: [facility];  Service: Gastroenterology;  Laterality: N/A;  1215 ASA 2   ESOPHAGOGASTRODUODENOSCOPY (EGD) WITH PROPOFOL N/A 11/26/2022   Procedure: ESOPHAGOGASTRODUODENOSCOPY (EGD) WITH PROPOFOL;  Surgeon: Dolores Frame, MD;  [facility]: [facility];  Service: Gastroenterology;  Laterality: N/A;  1:00PM;asa 2   POLYPECTOMY  05/22/2022   Procedure: POLYPECTOMY;  Surgeon: Marguerita Merles, Reuel Boom, MD;  Location: [facility];  Service: Gastroenterology;;   TUBAL LIGATION      Current Outpatient Medications  Medication Sig Dispense Refill   albuterol (VENTOLIN HFA) 108 (90 Base) MCG/ACT inhaler Inhale 2 puffs into the lungs every 6 (six) hours as needed for wheezing or shortness of breath. 8 g 2   amLODipine (NORVASC) 10 MG tablet Take 1 tablet (10 mg  total) by mouth daily. 90 tablet 1   aspirin EC 81 MG tablet Take 81 mg by mouth daily as needed for mild pain.     Cholecalciferol (VITAMIN D) 125 MCG (5000 UT) CAPS Take 5,000 Units by mouth daily.     cyclobenzaprine (FLEXERIL) 10 MG tablet Take 1 tablet (10 mg total) by mouth 2 (two) times daily as needed for muscle spasms. 60 tablet 2   dicyclomine (BENTYL) 10 MG capsule Take 1 capsule (10 mg total) by mouth every 12 (twelve) hours as needed for spasms (abdominal pain). 180 capsule 1   escitalopram (LEXAPRO) 10 MG tablet Take 1 tablet (10 mg total) by mouth daily. 30 tablet 3   gabapentin (NEURONTIN) 300 MG capsule Take 1 capsule (300 mg total) by mouth 3 (three) times daily. 90 capsule 1   lubiprostone (AMITIZA) 8 MCG capsule TAKE 1 CAPSULE BY MOUTH TWICE DAILY WITH A MEAL 60 capsule 0   metoprolol tartrate (LOPRESSOR) 50 MG tablet Take 1 tablet by mouth twice daily 90 tablet 0   Omega-3 Fatty Acids (FISH OIL) 1000 MG CAPS Take 2 capsules (2,000 mg total) by mouth in the morning and at bedtime. 90 capsule 3   omeprazole (PRILOSEC) 40 MG capsule Take 1 capsule by mouth once daily 60 capsule 0   ondansetron (ZOFRAN) 4 MG tablet Take 1 tablet (4 mg total) by mouth every 8 (eight) hours as needed for nausea or vomiting. 180 tablet 1   oxybutynin (DITROPAN-XL) 10 MG 24 hr tablet Take 2 tablets (20 mg total) by mouth at bedtime. 180 tablet 3   rosuvastatin (CRESTOR) 5 MG tablet Take 1 tablet (5 mg total) by mouth daily. 90 tablet 3   vitamin B-12 (CYANOCOBALAMIN) 1000 MCG tablet Take 1 tablet (1,000 mcg total) by mouth daily. 60 tablet 1   No current facility-administered medications for this visit.    Allergies as of 09/08/2023 - Review Complete 09/08/2023  Allergen Reaction Noted   Other  09/13/2012    Family History  Problem Relation Age of Onset   Hypertension Father    Hypercholesterolemia Father    COPD Mother    Emphysema Mother     Social History  Socioeconomic History    Marital status: Legally Separated    Spouse name: Not on file   Number of children: Not on file   Years of education: Not on file   Highest education level: GED or equivalent  Occupational History   Not on file  Tobacco Use   Smoking status: Every Day    Current packs/day: 1.50    Average packs/day: 1.5 packs/day for 34.0 years (51.0 ttl pk-yrs)    Types: Cigarettes    Passive exposure: Current   Smokeless tobacco: Never  Vaping Use   Vaping status: Never Used  Substance and Sexual Activity   Alcohol use: Not Currently   Drug use: No   Sexual activity: Yes    Birth control/protection: Surgical  Other Topics Concern   Not on file  Social History Narrative   Not on file   Social Drivers of Health   Financial Resource Strain: Low Risk  (08/04/2023)   Overall Financial Resource Strain (CARDIA)    Difficulty of Paying Living Expenses: Not very hard  Food Insecurity: Food Insecurity Present (08/04/2023)   Hunger Vital Sign    Worried About Running Out of Food in the Last Year: Sometimes true    Ran Out of Food in the Last Year: Sometimes true  Transportation Needs: No Transportation Needs (08/04/2023)   PRAPARE - Administrator, Civil Service (Medical): No    Lack of Transportation (Non-Medical): No  Physical Activity: Insufficiently Active (08/04/2023)   Exercise Vital Sign    Days of Exercise per Week: 3 days    Minutes of Exercise per Session: 30 min  Stress: Stress Concern Present (08/04/2023)   [employer] of Occupational Health - Occupational Stress Questionnaire    Feeling of Stress : Rather much  Social Connections: Unknown (08/04/2023)   Social Connection and Isolation Panel [NHANES]    Frequency of Communication with Friends and Family: Twice a week    Frequency of Social Gatherings with Friends and Family: Patient declined    Attends Religious Services: Never    Database administrator or Organizations: No    Attends Engineer, structural:  Not on file    Marital Status: Separated    Review of systems General: negative for malaise, night sweats, fever, chills, weight los Neck: Negative for lumps, goiter, pain and significant neck swelling Resp: Negative for cough, wheezing, dyspnea at rest CV: Negative for chest pain, leg swelling, palpitations, orthopnea GI: denies melena, hematochezia, nausea, vomiting, diarrhea, constipation, dysphagia, odyonophagia, early satiety or unintentional weight loss.  MSK: Negative for joint pain or swelling, back pain, and muscle pain. Derm: Negative for itching or rash Psych: Denies depression, anxiety, memory loss, confusion. No homicidal or suicidal ideation.  Heme: Negative for prolonged bleeding, bruising easily, and swollen nodes. Endocrine: Negative for cold or heat intolerance, polyuria, polydipsia and goiter. Neuro: negative for tremor, gait imbalance, syncope and seizures. The remainder of the review of systems is noncontributory.  Physical Exam: BP 116/77 (BP Location: Right Arm, Patient Position: Sitting, Cuff Size: Large)   Pulse 85   Temp (!) 97.5 F (36.4 C) (Temporal)   Ht 5\' 3"  (1.6 m)   Wt 211 lb 6.4 oz (95.9 kg)   LMP 08/05/2023   BMI 37.45 kg/m  General:   Alert and oriented. No distress noted. Pleasant and cooperative.  Head:  Normocephalic and atraumatic. Eyes:  Conjuctiva clear without scleral icterus. Mouth:  Oral mucosa pink and moist. Good dentition. No lesions. Heart:  Normal rate and rhythm, s1 and s2 heart sounds present.  Lungs: Clear lung sounds in all lobes. Respirations equal and unlabored. Abdomen:  +BS, soft, non-tender and non-distended. No rebound or guarding. No HSM or masses noted. Derm: No palmar erythema or jaundice Msk:  Symmetrical without gross deformities. Normal posture. Extremities:  Without edema. Neurologic:  Alert and  oriented x4 Psych:  Alert and cooperative. Normal mood and affect.  Invalid input(s): "6 MONTHS"    ASSESSMENT: [name] is a 48 y.o. female presenting today for follow up of IBS-C and nausea   IBS-C: has had significant improvement in constipation and bloating with initiation of amitiza BID. Having a BM almost daily now though still with some harder stools. She feels she is able to eat a bit more than previously though still only eating one meal per day. Will increase amitiza to BID to see if this provides optimal results given she has ongoing hard stools on lower dose. Encouraged to continue with good water intake, diet high in fruits, veggies, whole grains. She should let me know if she has more than 4 episodes of diarrhea per day or if she is not noticing improvement in her symptoms.   Nausea:recent EGD in May 2024 with small hiatal hernia. She continues to have ongoing nausea, controlled with zofran. We have discussed GES in the past but patient wanted to hold off. It was considered her significant constipation may be contributing. She does feel appetite is better with improvement in constipation though continues to have nausea. We again discussed GES, she would like to hold off for now and proceed if nausea persists.   GERD: well controlled on omeprazole 40mg  daily, will continue with current PPI regimen, good reflux precautions.    PLAN:  -increase amitiza to BID -continue zofran 4mg  PRN -continue bentyl 10mg  PRN -consider GES for ongoing nausea, pt to make me aware if she wants to proceed with this -Increase water intake, aim for atleast 64 oz per day -Increase fruits, veggies and whole grains, kiwi and prunes are especially good for constipation -consider anorectal manometry in the future if constipation persists  All questions were answered, patient verbalized understanding and is in agreement with plan as outlined above.    Follow Up: 6 months   Bridget Streng L. Jeanmarie Hubert, MSN, APRN, AGNP-C Adult-Gerontology Nurse Practitioner [facility]  [facility]  I have reviewed the note and agree with the APP's assessment as described in this progress note  Katrinka Blazing, MD Gastroenterology and Hepatology [facility]

## 2023-09-08 NOTE — Patient Instructions (Signed)
-  increase amitiza to to twice daily  -continue zofran 4mg  as needed for nausea -continue bentyl 10mg  as needed for abdominal pain  -consider GES for ongoing nausea, please let me know if you wish to schedule this  -Increase water intake, aim for atleast 64 oz per day -Increase fruits, veggies and whole grains, kiwi and prunes are especially good for constipation  Follow up 6 months  It was a pleasure to see you today. I want to create trusting relationships with patients and provide genuine, compassionate, and quality care. I truly value your feedback! please be on the lookout for a survey regarding your visit with me today. I appreciate your input about our visit and your time in completing this!    Brenlynn Fake L. Jeanmarie Hubert, MSN, APRN, AGNP-C Adult-Gerontology Nurse Practitioner [facility] at [facility]

## 2023-09-10 ENCOUNTER — Ambulatory Visit (HOSPITAL_COMMUNITY): Payer: Medicaid Other | Admitting: Physical Therapy

## 2023-09-10 DIAGNOSIS — M542 Cervicalgia: Secondary | ICD-10-CM

## 2023-09-10 DIAGNOSIS — G8929 Other chronic pain: Secondary | ICD-10-CM | POA: Diagnosis not present

## 2023-09-10 NOTE — Therapy (Addendum)
 OUTPATIENT PHYSICAL THERAPY CERVICAL TREATMENT   Patient Name: [name] MRN: [id number] DOB:May 10, 1976, 48 y.o., female Today's Date: 09/10/2023  END OF SESSION:  PT End of Session - 09/10/23 0728     Visit Number 3    Number of Visits 9    Date for PT Re-Evaluation 10/15/23    Authorization Type [place] [employer]    Authorization Time Period 6 visits approved 2/6-10/18/23    Authorization - Number of Visits 6    Progress Note Due on Visit 6    PT Start Time 0720    PT Stop Time 0800    PT Time Calculation (min) 40 min    Activity Tolerance Patient tolerated treatment well    Behavior During Therapy [facility] for tasks assessed/performed              Past Medical History:  Diagnosis Date   Anxiety    [employer] 12/30/2019   Depression    GERD (gastroesophageal reflux disease)    Hypertension    Past Surgical History:  Procedure Laterality Date   BIOPSY  05/22/2022   Procedure: BIOPSY;  Surgeon: Dolores Frame, MD;  [facility];  Service: Gastroenterology;;   BIOPSY  11/26/2022   Procedure: BIOPSY;  Surgeon: Dolores Frame, MD;  [facility];  Service: Gastroenterology;;   CHOLECYSTECTOMY     COLONOSCOPY WITH PROPOFOL N/A 05/22/2022   Procedure: COLONOSCOPY WITH PROPOFOL;  Surgeon: Dolores Frame, MD;  [facility];  Service: Gastroenterology;  Laterality: N/A;  1215 ASA 2   ESOPHAGOGASTRODUODENOSCOPY (EGD) WITH PROPOFOL N/A 11/26/2022   Procedure: ESOPHAGOGASTRODUODENOSCOPY (EGD) WITH PROPOFOL;  Surgeon: Dolores Frame, MD;  [facility];  Service: Gastroenterology;  Laterality: N/A;  1:00PM;asa 2   POLYPECTOMY  05/22/2022   Procedure: POLYPECTOMY;  Surgeon: Dolores Frame, MD;  [facility];  Service: Gastroenterology;;   TUBAL LIGATION     Patient Active Problem List   Diagnosis Date Noted   Nausea and vomiting 09/08/2023   Hidradenitis suppurativa 08/05/2023    Depression, major, single episode, moderate (HCC) 08/05/2023   Irritable bowel syndrome with constipation 06/09/2023   Mixed stress and urge urinary incontinence 04/07/2023   Neck pain 04/01/2023   Early satiety 03/01/2023   Overactive bladder 11/05/2022   Diarrhea 05/05/2022   Loss of weight 05/05/2022   Vitamin B12 deficiency 04/12/2020   Vitamin D deficiency 04/12/2020   Dizziness 04/12/2020   HTN (hypertension) 12/30/2019   Tobacco abuse 12/30/2019   GERD (gastroesophageal reflux disease) 12/30/2019   Spondylosis 12/30/2019    PCP: Rica Records FNP  REFERRING PROVIDER: Rica Records, FNP  REFERRING DIAG: [phone] (ICD-10-CM) - Chronic neck pain  THERAPY DIAG:  Cervicalgia  Rationale for Evaluation and Treatment: Rehabilitation  ONSET DATE: 2020  SUBJECTIVE:  SUBJECTIVE STATEMENT: Pt states her pain remains high and now having radiating pain into her legs.  Reports upwards of 8/10 pain.  Difficulty sleeping at night because can't get comfortable with her neck.   EVAL: Arrives to the clinic with neck pain (see below). Used to have a tingling sensation on the L hand but now it's gone. Patient states that she cannot notice if the L hand is weaker than the R. Condition started in 2020 when patient fell at work backwards and lost consciousness. Patient was rushed to the ER with the L UE being swollen and unable to move the L UE. Patient was thought to have stroke but was negative for CVA. Since then, the neck started to hurt. Patient had received epidural injections on the neck which only helped a little. Patient had to stop the injections due to insurance/financial issues. Since then, condition has gotten worse. Patient went to to the neurologist yesterday  and was given meds. MD also told her to try outpatient PT evaluation and management. Hand dominance: Right Pt has been out of work since last year due to c-diff issues. PERTINENT HISTORY:    PAIN:  Are you having pain? Yes: NPRS scale: 5/10 Pain location: neck, upper back, up to the back of L hand Pain description: constant, sharp and shooting Aggravating factors: night, bending over, turning the neck R/L Relieving factors: heating pad, hot showers  PRECAUTIONS: None  RED FLAGS: None     WEIGHT BEARING RESTRICTIONS: No  FALLS:  Has patient fallen in last 6 months? No  LIVING ENVIRONMENT: Lives with: lives with their daughter Lives in: House/apartment Stairs: Yes: External: 5 steps; can reach both Has following equipment at home: None  OCCUPATION: unemployed  PLOF: Independent and Independent with basic ADLs  PATIENT GOALS: "to relieve pain and tension on my neck"  NEXT MD VISIT: March 2025  OBJECTIVE:  Note: Objective measures were completed at Evaluation unless otherwise noted.  DIAGNOSTIC FINDINGS:  02/28/2019 MRI CERVICAL SPINE WITHOUT CONTRAST   TECHNIQUE: Multiplanar, multisequence MR imaging of the cervical spine was performed. No intravenous contrast was administered.   COMPARISON:  None.   FINDINGS: Alignment: Maintained with straightening of lordosis.   Vertebrae: No fracture, evidence of discitis, or bone lesion. Degenerative endplate signal change in C5 eccentric to the right noted.   Cord: Normal signal throughout.   Posterior Fossa, vertebral arteries, paraspinal tissues: Negative.   Disc levels:   C2-3: Shallow disc bulge without stenosis.   C3-4: Small central disc protrusion contacts the ventral cord but the central canal and foramina remain open.   C4-5: Broad-based central protrusion mildly indents the ventral cord. Foramina are open.   C5-6: Show broad-based disc bulge mildly flattens the ventral cord. Mild bilateral foraminal  narrowing is present.   C6-7: Diffuse broad-based disc bulge flattens the cord. Thickening of the posterior longitudinal ligament posterior to C6 also contributes to cord flattening. Moderate to moderately severe bilateral foraminal narrowing due to uncovertebral disease is seen.   C7-T1: Shallow disc bulge.   IMPRESSION: Spondylosis appears worst at C6-7 where a broad-based disc bulge flattens the cord and uncovertebral disease causes moderate to moderately severe bilateral foraminal narrowing. Posterior longitudinal ligament posterior to C6 is thickened and contributes to cord flattening.   Shallow disc bulge at C5-6 mildly flattens the ventral cord.   Broad-based central protrusion mildly indents the ventral cord at C4-5.   Small central disc protrusion at C3-4 contacts the cord without central canal stenosis.  PATIENT SURVEYS:  NDI 24/50 = 48%  COGNITION: Overall cognitive status: Within functional limits for tasks assessed  SENSATION: Light touch: Impaired on the L C6, C8 dermatome, impaired on posterior aspect of L hand  POSTURE: rounded shoulders and forward head  PALPATION: Grade 2 tenderness and moderate spasm on B upper trapezius Moderate restriction on B pectoralis, suboccipitals, upper trapezius, and lev scap   CERVICAL ROM:   Active ROM A/PROM (deg) eval  Flexion 10  Extension 40  Right lateral flexion   Left lateral flexion   Right rotation 40  Left rotation 50   (Blank rows = not tested)  UPPER EXTREMITY ROM:  Active ROM Right eval Left eval  Shoulder flexion [facility]  Shoulder extension    Shoulder abduction [facility]  Shoulder adduction [facility]  Shoulder extension    Shoulder internal rotation    Shoulder external rotation    Elbow flexion WFL WFL  Elbow extension [facility] WFL  Wrist flexion [facility] WFL  Wrist extension [facility] WFL  Wrist ulnar deviation    Wrist radial deviation    Wrist pronation    Wrist supination     (Blank rows = not  tested)  UPPER EXTREMITY MMT:  MMT Right eval Left eval  Shoulder flexion 5 4+  Shoulder extension    Shoulder abduction 5 4+  Shoulder adduction    Shoulder extension    Shoulder internal rotation 5 4+  Shoulder external rotation 5 4+  Middle trapezius 4 4-  Lower trapezius 4 3+  Elbow flexion 5 5  Elbow extension 5 5  Wrist flexion 5 5  Wrist extension 5 4  Wrist ulnar deviation    Wrist radial deviation    Wrist pronation    Wrist supination    Grip strength     (Blank rows = not tested)  Grip (lbs) Right eval Left eval  Trial 1 60 40  Trial 2 70 30  Trial 3 65 35  Average 65 35   (Blank rows = not tested)  CERVICAL SPECIAL TESTS:  Upper limb tension test (ULTT): (+) R radial nerve, Spurling's test: (+) R, and Distraction test: worsened the symptoms on the fingers    TREATMENT DATE:  09/10/23  Cervical ROM all motions 5X each direction Scapular Retraction 10X each 5" holds Neck Retraction  10X  5 sec hold Radial Nerve Glide   3 reps 30 sec hold Median Nerve Gliding 3 reps 10 sec hold Ulnar Nerve Glide 3 reps 10 sec hold Doorway Pec Stretch 3X 30 sec hold Shoulder Shrug Circles AROM  10 reps Thoracic excursions with UE movements 5 reps each direction Upper trap stretch 3X20" holds Shoulder IR/ER (flashers) 10X each  08/27/23 Seated:  cervical ROM all motions 5X each direction Scapular Retraction 10X each 5" holds Neck Retraction  5X  5 sec hold Shoulder External and Internal Rotation AROM   10X Upper Trap Stretch  3X  20 sec hold Thoracic excursions with UE movements 5 reps each  08/20/23 Evaluation and patient education done  PATIENT EDUCATION:  Education details: Educated on the pathoanatomy of cervical pain. Educated on the goals and course of rehab.  Person educated: Patient Education method: Explanation Education  comprehension: verbalized understanding  HOME EXERCISE PROGRAM: None provided to date  Access Code: [email or web address] URL: [email or web address] Date: 08/27/2023 Prepared by: Emeline Gins  Exercises - Seated Scapular Retraction with External Rotation  - 2 x daily - 7 x weekly - 10 reps - 5 sec hold - Neck Retraction  - 2 x daily - 7 x weekly - 5 reps - 5 sec hold - Standing Shoulder External and Internal Rotation AROM  - 2 x daily - 7 x weekly - 10 reps - Seated Upper Trap Stretch  - 2 x daily - 7 x weekly - 3 reps - 20 sec hold Cervical AROM exercises each direction 5 reps each Thoracic excursions with UE movements 5 reps each Access Code: [email or web address] URL: [email or web address]  Date: 09/10/2023 Prepared by: Emeline Gins Exercises - Standing Radial Nerve Glide  - 2 x daily - 7 x weekly - 3 reps - 30 sec hold - Median Nerve Gliding: Proximal  - 2 x daily - 7 x weekly - 3 reps - 10 sec hold - Ulnar Nerve Glide- Full Arm  - 2 x daily - 7 x weekly - 3 reps - 10 sec hold - Doorway Pec Stretch at 60 Elevation  - 2 x daily - 7 x weekly - 3 reps - 30 sec hold - Standing Shoulder Shrug Circles AROM Backward  - 2 x daily - 7 x weekly - 10 reps  ASSESSMENT:  CLINICAL IMPRESSION: PT missed several weeks due to inclement weather.  Reviewed established HEP and progressed therex to include ROM and nerve glides. Pt able to complete all these with cues for increasing hold times and relaxing accessory mm.  Pt with noted weakness in UE's with inability to hold up greater than 10 seconds with therex. Continues to maintain upper chain stiffness.  Improved motion with verbal cues while completing thoracic excursions today.  Updated HEP to include exercises added.  Suggested to try a contour pillow to sleep with at night to see if it decreases waking with pain.  Pt will continue to benefit from skilled therapy.  EVAL: Patient is a 48 y.o. female who was seen today for physical therapy  evaluation and treatment for chronic neck pain. Patient's condition is further defined by difficulty bending over and turning the head due to pain, weakness, and decreased soft tissue extensibility. Skilled PT is required to address the impairments and functional limitations listed below.    OBJECTIVE IMPAIRMENTS: decreased mobility, decreased ROM, decreased strength, increased muscle spasms, impaired flexibility, impaired sensation, postural dysfunction, and pain.   ACTIVITY LIMITATIONS: carrying, lifting, bending, and sleeping  PARTICIPATION LIMITATIONS: meal prep, cleaning, laundry, driving, shopping, community activity, and yard work  PERSONAL FACTORS: Time since onset of injury/illness/exacerbation are also affecting patient's functional outcome.   REHAB POTENTIAL: Fair    CLINICAL DECISION MAKING: Stable/uncomplicated  EVALUATION COMPLEXITY: Low   GOALS: Goals reviewed with patient? Yes  SHORT TERM GOALS: Target date: 09/17/23  Pt will demonstrate indep in HEP to facilitate carry-over of skilled services and improve functional outcomes Goal status: IN PROGRESS  LONG TERM GOALS: Target date: 10/15/23  Pt will demonstrate a decrease in NDI score by 19 % to demonstrate significant improvement in ADLs  Baseline: 48% Goal status: IN PROGRESS  2.  Pt will demonstrate increase in cervical flex and rot by 10  deg  to facilitate ease in ADLs Baseline: see above Goal status: IN PROGRESS  3.  Pt will demonstrate increase in UE strength to 4/5 to facilitate ease in ADLs Baseline: 3+/5 Goal status: IN PROGRESS  4.  Pt will demonstrate an increase in L grip strength by 10-15 lbs to facilitate ease in ADLs Baseline: 35 lbs Goal status: IN PROGRESS  5.  Pt will be able to bend and turn the head with mild pain (3-4/10) to facilitate ease in ADLs Baseline: 5/10 Goal status: IN PROGRESS   PLAN:  PT FREQUENCY: 1x/week  PT DURATION: 8 weeks  PLANNED INTERVENTIONS: 97164- PT  Re-evaluation, 97110-Therapeutic exercises, 97530- Therapeutic activity, 97112- Neuromuscular re-education, 97535- Self Care, 32440- Manual therapy, 97116- Gait training, 97014- Electrical stimulation (unattended), Patient/Family education, Taping, Dry Needling, Cryotherapy, and Moist heat  PLAN FOR NEXT SESSION: Progress cervical strengthening and mobility activities. Begin postural strengthening and education next session.  Lurena Nida, PTA/CLT [facility] Ph: [phone]   Lurena Nida, PTA 09/10/2023, 8:40 AM

## 2023-09-17 ENCOUNTER — Ambulatory Visit (HOSPITAL_COMMUNITY): Payer: Medicaid Other | Attending: Family Medicine

## 2023-09-17 DIAGNOSIS — M542 Cervicalgia: Secondary | ICD-10-CM | POA: Insufficient documentation

## 2023-09-17 NOTE — Therapy (Addendum)
 OUTPATIENT PHYSICAL THERAPY CERVICAL TREATMENT   Patient Name: [name] MRN: [id number] DOB:06/09/1976, 48 y.o., female Today's Date: 09/18/2023  END OF SESSION:  PT End of Session - 09/17/23 0729     Visit Number 4    Number of Visits 9    Date for PT Re-Evaluation 10/15/23    Authorization Type [place] Medicaid    Authorization Time Period 6 visits approved 2/6-10/18/23    Authorization - Visit Number 3    Authorization - Number of Visits 6    Progress Note Due on Visit 6    PT Start Time 0723    PT Stop Time 0801    PT Time Calculation (min) 38 min    Activity Tolerance Patient tolerated treatment well    Behavior During Therapy [facility] for tasks assessed/performed               Past Medical History:  Diagnosis Date   Anxiety    Phreesia 12/30/2019   Depression    GERD (gastroesophageal reflux disease)    Hypertension    Past Surgical History:  Procedure Laterality Date   BIOPSY  05/22/2022   Procedure: BIOPSY;  Surgeon: Dolores Frame, MD;  [facility];  Service: Gastroenterology;;   BIOPSY  11/26/2022   Procedure: BIOPSY;  Surgeon: Dolores Frame, MD;  [facility];  Service: Gastroenterology;;   CHOLECYSTECTOMY     COLONOSCOPY WITH PROPOFOL N/A 05/22/2022   Procedure: COLONOSCOPY WITH PROPOFOL;  Surgeon: Dolores Frame, MD;  [facility];  Service: Gastroenterology;  Laterality: N/A;  1215 ASA 2   ESOPHAGOGASTRODUODENOSCOPY (EGD) WITH PROPOFOL N/A 11/26/2022   Procedure: ESOPHAGOGASTRODUODENOSCOPY (EGD) WITH PROPOFOL;  Surgeon: Dolores Frame, MD;  [facility];  Service: Gastroenterology;  Laterality: N/A;  1:00PM;asa 2   POLYPECTOMY  05/22/2022   Procedure: POLYPECTOMY;  Surgeon: Dolores Frame, MD;  [facility];  Service: Gastroenterology;;   TUBAL LIGATION     Patient Active Problem List   Diagnosis Date Noted   Nausea and vomiting 09/08/2023    Hidradenitis suppurativa 08/05/2023   Depression, major, single episode, moderate (HCC) 08/05/2023   Irritable bowel syndrome with constipation 06/09/2023   Mixed stress and urge urinary incontinence 04/07/2023   Neck pain 04/01/2023   Early satiety 03/01/2023   Overactive bladder 11/05/2022   Diarrhea 05/05/2022   Loss of weight 05/05/2022   Vitamin B12 deficiency 04/12/2020   Vitamin D deficiency 04/12/2020   Dizziness 04/12/2020   HTN (hypertension) 12/30/2019   Tobacco abuse 12/30/2019   GERD (gastroesophageal reflux disease) 12/30/2019   Spondylosis 12/30/2019    PCP: Rica Records FNP  REFERRING PROVIDER: Rica Records, FNP  REFERRING DIAG: [phone] (ICD-10-CM) - Chronic neck pain  THERAPY DIAG:  Cervicalgia  Rationale for Evaluation and Treatment: Rehabilitation  ONSET DATE: 2020  SUBJECTIVE:  SUBJECTIVE STATEMENT: Pt reports that she's doing okay. Was able to sleep through the night and did not have any neck pain or UE pain.   EVAL: Arrives to the clinic with neck pain (see below). Used to have a tingling sensation on the L hand but now it's gone. Patient states that she cannot notice if the L hand is weaker than the R. Condition started in 2020 when patient fell at work backwards and lost consciousness. Patient was rushed to the ER with the L UE being swollen and unable to move the L UE. Patient was thought to have stroke but was negative for CVA. Since then, the neck started to hurt. Patient had received epidural injections on the neck which only helped a little. Patient had to stop the injections due to insurance/financial issues. Since then, condition has gotten worse. Patient went to to the neurologist yesterday and was given meds. MD also told  her to try outpatient PT evaluation and management. Hand dominance: Right Pt has been out of work since last year due to c-diff issues. PERTINENT HISTORY:    PAIN:  Are you having pain? Yes: NPRS scale: 5/10 Pain location: neck, upper back, up to the back of L hand Pain description: constant, sharp and shooting Aggravating factors: night, bending over, turning the neck R/L Relieving factors: heating pad, hot showers  PRECAUTIONS: None  RED FLAGS: None     WEIGHT BEARING RESTRICTIONS: No  FALLS:  Has patient fallen in last 6 months? No  LIVING ENVIRONMENT: Lives with: lives with their daughter Lives in: House/apartment Stairs: Yes: External: 5 steps; can reach both Has following equipment at home: None  OCCUPATION: unemployed  PLOF: Independent and Independent with basic ADLs  PATIENT GOALS: "to relieve pain and tension on my neck"  NEXT MD VISIT: March 2025  OBJECTIVE:  Note: Objective measures were completed at Evaluation unless otherwise noted.  DIAGNOSTIC FINDINGS:  02/28/2019 MRI CERVICAL SPINE WITHOUT CONTRAST   TECHNIQUE: Multiplanar, multisequence MR imaging of the cervical spine was performed. No intravenous contrast was administered.   COMPARISON:  None.   FINDINGS: Alignment: Maintained with straightening of lordosis.   Vertebrae: No fracture, evidence of discitis, or bone lesion. Degenerative endplate signal change in C5 eccentric to the right noted.   Cord: Normal signal throughout.   Posterior Fossa, vertebral arteries, paraspinal tissues: Negative.   Disc levels:   C2-3: Shallow disc bulge without stenosis.   C3-4: Small central disc protrusion contacts the ventral cord but the central canal and foramina remain open.   C4-5: Broad-based central protrusion mildly indents the ventral cord. Foramina are open.   C5-6: Show broad-based disc bulge mildly flattens the ventral cord. Mild bilateral foraminal narrowing is present.   C6-7:  Diffuse broad-based disc bulge flattens the cord. Thickening of the posterior longitudinal ligament posterior to C6 also contributes to cord flattening. Moderate to moderately severe bilateral foraminal narrowing due to uncovertebral disease is seen.   C7-T1: Shallow disc bulge.   IMPRESSION: Spondylosis appears worst at C6-7 where a broad-based disc bulge flattens the cord and uncovertebral disease causes moderate to moderately severe bilateral foraminal narrowing. Posterior longitudinal ligament posterior to C6 is thickened and contributes to cord flattening.   Shallow disc bulge at C5-6 mildly flattens the ventral cord.   Broad-based central protrusion mildly indents the ventral cord at C4-5.   Small central disc protrusion at C3-4 contacts the cord without central canal stenosis.    PATIENT SURVEYS:  NDI 24/50 = 48%  COGNITION: Overall cognitive status: Within functional limits for tasks assessed  SENSATION: Light touch: Impaired on the L C6, C8 dermatome, impaired on posterior aspect of L hand  POSTURE: rounded shoulders and forward head  PALPATION: Grade 2 tenderness and moderate spasm on B upper trapezius Moderate restriction on B pectoralis, suboccipitals, upper trapezius, and lev scap   CERVICAL ROM:   Active ROM A/PROM (deg) eval  Flexion 10  Extension 40  Right lateral flexion   Left lateral flexion   Right rotation 40  Left rotation 50   (Blank rows = not tested)  UPPER EXTREMITY ROM:  Active ROM Right eval Left eval  Shoulder flexion [facility]  Shoulder extension    Shoulder abduction [facility]  Shoulder adduction [facility]  Shoulder extension    Shoulder internal rotation    Shoulder external rotation    Elbow flexion WFL WFL  Elbow extension [facility] WFL  Wrist flexion [facility] WFL  Wrist extension [facility] WFL  Wrist ulnar deviation    Wrist radial deviation    Wrist pronation    Wrist supination     (Blank rows = not tested)  UPPER EXTREMITY  MMT:  MMT Right eval Left eval  Shoulder flexion 5 4+  Shoulder extension    Shoulder abduction 5 4+  Shoulder adduction    Shoulder extension    Shoulder internal rotation 5 4+  Shoulder external rotation 5 4+  Middle trapezius 4 4-  Lower trapezius 4 3+  Elbow flexion 5 5  Elbow extension 5 5  Wrist flexion 5 5  Wrist extension 5 4  Wrist ulnar deviation    Wrist radial deviation    Wrist pronation    Wrist supination    Grip strength     (Blank rows = not tested)  Grip (lbs) Right eval Left eval  Trial 1 60 40  Trial 2 70 30  Trial 3 65 35  Average 65 35   (Blank rows = not tested)  CERVICAL SPECIAL TESTS:  Upper limb tension test (ULTT): (+) R radial nerve, Spurling's test: (+) R, and Distraction test: worsened the symptoms on the fingers    TREATMENT DATE:  09/17/23 UBE, forward, level 1, 5' Upper trapezius stretch x 30" x 2 Self-SNAGs rotation to the L with a towel x 3" x 10 Supine chin tuck x 3" x 10 x 2 Supine cervical rotation x 3" x 10 on each Standing rows, RTB x 3" x 10 Standing I, RTB x 3" x 10  09/10/23  Cervical ROM all motions 5X each direction Scapular Retraction 10X each 5" holds Neck Retraction  10X  5 sec hold Radial Nerve Glide   3 reps 30 sec hold Median Nerve Gliding 3 reps 10 sec hold Ulnar Nerve Glide 3 reps 10 sec hold Doorway Pec Stretch 3X 30 sec hold Shoulder Shrug Circles AROM  10 reps Thoracic excursions with UE movements 5 reps each direction Upper trap stretch 3X20" holds Shoulder IR/ER (flashers) 10X each  08/27/23 Seated:  cervical ROM all motions 5X each direction Scapular Retraction 10X each 5" holds Neck Retraction  5X  5 sec hold Shoulder External and Internal Rotation AROM   10X Upper Trap Stretch  3X  20 sec hold Thoracic excursions with UE movements 5 reps each  08/20/23 Evaluation and patient education done  PATIENT EDUCATION:  Education details: Educated on the pathoanatomy of cervical pain. Educated on the goals and course of rehab.  Person educated: Patient Education method: Explanation Education comprehension: verbalized understanding  HOME EXERCISE PROGRAM: Access Code: [email or web address] URL: [email or web address] 09/17/2023 - Supine Chin Tuck  - 1 x daily - 7 x weekly - 2 sets - 10 reps - 3 hold - Seated Assisted Cervical Rotation with Towel  - 1-2 x daily - 7 x weekly - 1 sets - 10 reps - 3 hold - Supine Cervical Rotation AROM on Pillow  - 1-2 x daily - 7 x weekly - 1 sets - 10 reps - 3 hold - Standing Row with Anchored Resistance  - 2 x daily - 7 x weekly - 1 sets - 10 reps - 3 hold - Shoulder Extension with Resistance  - 2 x daily - 7 x weekly - 1 sets - 10 reps - 3 hold  Date: 08/27/2023 Prepared by: Emeline Gins  Exercises - Seated Scapular Retraction with External Rotation  - 2 x daily - 7 x weekly - 10 reps - 5 sec hold - Neck Retraction  - 2 x daily - 7 x weekly - 5 reps - 5 sec hold - Standing Shoulder External and Internal Rotation AROM  - 2 x daily - 7 x weekly - 10 reps - Seated Upper Trap Stretch  - 2 x daily - 7 x weekly - 3 reps - 20 sec hold Cervical AROM exercises each direction 5 reps each Thoracic excursions with UE movements 5 reps each Access Code: [email or web address] URL: [email or web address]  Date: 09/10/2023 Prepared by: Emeline Gins Exercises - Standing Radial Nerve Glide  - 2 x daily - 7 x weekly - 3 reps - 30 sec hold - Median Nerve Gliding: Proximal  - 2 x daily - 7 x weekly - 3 reps - 10 sec hold - Ulnar Nerve Glide- Full Arm  - 2 x daily - 7 x weekly - 3 reps - 10 sec hold - Doorway Pec Stretch at 60 Elevation  - 2 x daily - 7 x weekly - 3 reps - 30 sec hold - Standing Shoulder Shrug Circles AROM Backward  - 2 x daily - 7 x weekly - 10 reps  ASSESSMENT:  CLINICAL IMPRESSION: Interventions today were geared towards  cervical mobility and strengthening. Tolerated all activities without eliciting/worsening of radicular symptoms. Demonstrated appropriate levels of fatigue. Provided slight amount of cueing to ensure correct execution of activity with good carry-over. Pacing of activities was slightly slow. To date, skilled PT is required to address the impairments and improve function.   EVAL: Patient is a 48 y.o. female who was seen today for physical therapy evaluation and treatment for chronic neck pain. Patient's condition is further defined by difficulty bending over and turning the head due to pain, weakness, and decreased soft tissue extensibility. Skilled PT is required to address the impairments and functional limitations listed below.    OBJECTIVE IMPAIRMENTS: decreased mobility, decreased ROM, decreased strength, increased muscle spasms, impaired flexibility, impaired sensation, postural dysfunction, and pain.   ACTIVITY LIMITATIONS: carrying, lifting, bending, and sleeping  PARTICIPATION LIMITATIONS: meal prep, cleaning, laundry, driving, shopping, community activity, and yard work  PERSONAL FACTORS: Time since onset of injury/illness/exacerbation are also affecting patient's functional outcome.   REHAB POTENTIAL: Fair    CLINICAL DECISION MAKING: Stable/uncomplicated  EVALUATION COMPLEXITY: Low   GOALS: Goals reviewed with patient? Yes  SHORT TERM GOALS: Target date: 09/17/23  Pt will demonstrate  indep in HEP to facilitate carry-over of skilled services and improve functional outcomes Goal status: IN PROGRESS  LONG TERM GOALS: Target date: 10/15/23  Pt will demonstrate a decrease in NDI score by 19 % to demonstrate significant improvement in ADLs  Baseline: 48% Goal status: IN PROGRESS  2.  Pt will demonstrate increase in cervical flex and rot by 10 deg  to facilitate ease in ADLs Baseline: see above Goal status: IN PROGRESS  3.  Pt will demonstrate increase in UE strength to 4/5 to  facilitate ease in ADLs Baseline: 3+/5 Goal status: IN PROGRESS  4.  Pt will demonstrate an increase in L grip strength by 10-15 lbs to facilitate ease in ADLs Baseline: 35 lbs Goal status: IN PROGRESS  5.  Pt will be able to bend and turn the head with mild pain (3-4/10) to facilitate ease in ADLs Baseline: 5/10 Goal status: IN PROGRESS   PLAN:  PT FREQUENCY: 1x/week  PT DURATION: 8 weeks  PLANNED INTERVENTIONS: 97164- PT Re-evaluation, 97110-Therapeutic exercises, 97530- Therapeutic activity, 97112- Neuromuscular re-education, 97535- Self Care, 91478- Manual therapy, 97116- Gait training, 97014- Electrical stimulation (unattended), Patient/Family education, Taping, Dry Needling, Cryotherapy, and Moist heat  PLAN FOR NEXT SESSION: Progress cervical strengthening and mobility activities. Begin postural strengthening and education next session.  Tish Frederickson. Huxley Vanwagoner, PT, DPT, OCS Board-Certified Clinical Specialist in Orthopedic PT PT Compact Privilege # ([place]): [id number] T 09/18/2023, 8:07 AM

## 2023-09-24 ENCOUNTER — Encounter (HOSPITAL_COMMUNITY): Payer: Medicaid Other | Admitting: Physical Therapy

## 2023-09-30 ENCOUNTER — Ambulatory Visit: Payer: Medicaid Other | Admitting: Physician Assistant

## 2023-09-30 ENCOUNTER — Encounter (HOSPITAL_COMMUNITY): Admitting: Physical Therapy

## 2023-10-01 ENCOUNTER — Ambulatory Visit (INDEPENDENT_AMBULATORY_CARE_PROVIDER_SITE_OTHER): Payer: Medicaid Other

## 2023-10-01 ENCOUNTER — Encounter (HOSPITAL_COMMUNITY): Payer: Self-pay

## 2023-10-01 DIAGNOSIS — M542 Cervicalgia: Secondary | ICD-10-CM

## 2023-10-01 NOTE — Therapy (Signed)
 OUTPATIENT PHYSICAL THERAPY CERVICAL TREATMENT   Patient Name: [name] MRN: [id number] DOB:1975/09/19, 48 y.o., female Today's Date: 10/01/2023  END OF SESSION:  PT End of Session - 10/01/23 0720     Visit Number 5    Number of Visits 9    Date for PT Re-Evaluation 10/15/23    Authorization Type [place] Medicaid    Authorization Time Period 6 visits approved 2/6-10/18/23    Authorization - Number of Visits 6    Progress Note Due on Visit 6    PT Start Time 0721    PT Stop Time 0803    PT Time Calculation (min) 42 min    Activity Tolerance Patient tolerated treatment well    Behavior During Therapy [facility] for tasks assessed/performed               Past Medical History:  Diagnosis Date   Anxiety    Phreesia 12/30/2019   Depression    GERD (gastroesophageal reflux disease)    Hypertension    Past Surgical History:  Procedure Laterality Date   BIOPSY  05/22/2022   Procedure: BIOPSY;  Surgeon: Dolores Frame, MD;  [facility];  Service: Gastroenterology;;   BIOPSY  11/26/2022   Procedure: BIOPSY;  Surgeon: Dolores Frame, MD;  [facility];  Service: Gastroenterology;;   CHOLECYSTECTOMY     COLONOSCOPY WITH PROPOFOL N/A 05/22/2022   Procedure: COLONOSCOPY WITH PROPOFOL;  Surgeon: Dolores Frame, MD;  [facility];  Service: Gastroenterology;  Laterality: N/A;  1215 ASA 2   ESOPHAGOGASTRODUODENOSCOPY (EGD) WITH PROPOFOL N/A 11/26/2022   Procedure: ESOPHAGOGASTRODUODENOSCOPY (EGD) WITH PROPOFOL;  Surgeon: Dolores Frame, MD;  [facility];  Service: Gastroenterology;  Laterality: N/A;  1:00PM;asa 2   POLYPECTOMY  05/22/2022   Procedure: POLYPECTOMY;  Surgeon: Dolores Frame, MD;  [facility];  Service: Gastroenterology;;   TUBAL LIGATION     Patient Active Problem List   Diagnosis Date Noted   Nausea and vomiting 09/08/2023   Hidradenitis suppurativa 08/05/2023    Depression, major, single episode, moderate (HCC) 08/05/2023   Irritable bowel syndrome with constipation 06/09/2023   Mixed stress and urge urinary incontinence 04/07/2023   Neck pain 04/01/2023   Early satiety 03/01/2023   Overactive bladder 11/05/2022   Diarrhea 05/05/2022   Loss of weight 05/05/2022   Vitamin B12 deficiency 04/12/2020   Vitamin D deficiency 04/12/2020   Dizziness 04/12/2020   HTN (hypertension) 12/30/2019   Tobacco abuse 12/30/2019   GERD (gastroesophageal reflux disease) 12/30/2019   Spondylosis 12/30/2019    PCP: Rica Records FNP  REFERRING PROVIDER: Rica Records, FNP  REFERRING DIAG: [phone] (ICD-10-CM) - Chronic neck pain  THERAPY DIAG:  Cervicalgia  Rationale for Evaluation and Treatment: Rehabilitation  ONSET DATE: 2020  SUBJECTIVE:  SUBJECTIVE STATEMENT: Pt reports she has some stiffness in her neck and tingly sometimes in all her right fingers. Reports her dad fell yesterday and that's why she missed her appointment.Reports she hasn't been sleeping at night anymore. No pain right now but daily pain gets up to an 8/10. Returns to doctor on 10/16/23  EVAL: Arrives to the clinic with neck pain (see below). Used to have a tingling sensation on the L hand but now it's gone. Patient states that she cannot notice if the L hand is weaker than the R. Condition started in 2020 when patient fell at work backwards and lost consciousness. Patient was rushed to the ER with the L UE being swollen and unable to move the L UE. Patient was thought to have stroke but was negative for CVA. Since then, the neck started to hurt. Patient had received epidural injections on the neck which only helped a little. Patient had to stop the injections due to  insurance/financial issues. Since then, condition has gotten worse. Patient went to to the neurologist yesterday and was given meds. MD also told her to try outpatient PT evaluation and management. Hand dominance: Right Pt has been out of work since last year due to c-diff issues. PERTINENT HISTORY:    PAIN:  Are you having pain? Yes: NPRS scale: 5/10 Pain location: neck, upper back, up to the back of L hand Pain description: constant, sharp and shooting Aggravating factors: night, bending over, turning the neck R/L Relieving factors: heating pad, hot showers  PRECAUTIONS: None  RED FLAGS: None     WEIGHT BEARING RESTRICTIONS: No  FALLS:  Has patient fallen in last 6 months? No  LIVING ENVIRONMENT: Lives with: lives with their daughter Lives in: House/apartment Stairs: Yes: External: 5 steps; can reach both Has following equipment at home: None  OCCUPATION: unemployed  PLOF: Independent and Independent with basic ADLs  PATIENT GOALS: "to relieve pain and tension on my neck"  NEXT MD VISIT: March 2025  OBJECTIVE:  Note: Objective measures were completed at Evaluation unless otherwise noted.  DIAGNOSTIC FINDINGS:  02/28/2019 MRI CERVICAL SPINE WITHOUT CONTRAST   TECHNIQUE: Multiplanar, multisequence MR imaging of the cervical spine was performed. No intravenous contrast was administered.   COMPARISON:  None.   FINDINGS: Alignment: Maintained with straightening of lordosis.   Vertebrae: No fracture, evidence of discitis, or bone lesion. Degenerative endplate signal change in C5 eccentric to the right noted.   Cord: Normal signal throughout.   Posterior Fossa, vertebral arteries, paraspinal tissues: Negative.   Disc levels:   C2-3: Shallow disc bulge without stenosis.   C3-4: Small central disc protrusion contacts the ventral cord but the central canal and foramina remain open.   C4-5: Broad-based central protrusion mildly indents the ventral cord.  Foramina are open.   C5-6: Show broad-based disc bulge mildly flattens the ventral cord. Mild bilateral foraminal narrowing is present.   C6-7: Diffuse broad-based disc bulge flattens the cord. Thickening of the posterior longitudinal ligament posterior to C6 also contributes to cord flattening. Moderate to moderately severe bilateral foraminal narrowing due to uncovertebral disease is seen.   C7-T1: Shallow disc bulge.   IMPRESSION: Spondylosis appears worst at C6-7 where a broad-based disc bulge flattens the cord and uncovertebral disease causes moderate to moderately severe bilateral foraminal narrowing. Posterior longitudinal ligament posterior to C6 is thickened and contributes to cord flattening.   Shallow disc bulge at C5-6 mildly flattens the ventral cord.   Broad-based central protrusion mildly indents the  ventral cord at C4-5.   Small central disc protrusion at C3-4 contacts the cord without central canal stenosis.    PATIENT SURVEYS:  NDI 24/50 = 48%  COGNITION: Overall cognitive status: Within functional limits for tasks assessed  SENSATION: Light touch: Impaired on the L C6, C8 dermatome, impaired on posterior aspect of L hand  POSTURE: rounded shoulders and forward head  PALPATION: Grade 2 tenderness and moderate spasm on B upper trapezius Moderate restriction on B pectoralis, suboccipitals, upper trapezius, and lev scap   CERVICAL ROM:   Active ROM A/PROM (deg) eval  Flexion 10  Extension 40  Right lateral flexion   Left lateral flexion   Right rotation 40  Left rotation 50   (Blank rows = not tested)  UPPER EXTREMITY ROM:  Active ROM Right eval Left eval  Shoulder flexion [facility]  Shoulder extension    Shoulder abduction [facility]  Shoulder adduction [facility]  Shoulder extension    Shoulder internal rotation    Shoulder external rotation    Elbow flexion WFL WFL  Elbow extension [facility] WFL  Wrist flexion [facility] WFL  Wrist extension [facility] WFL   Wrist ulnar deviation    Wrist radial deviation    Wrist pronation    Wrist supination     (Blank rows = not tested)  UPPER EXTREMITY MMT:  MMT Right eval Left eval  Shoulder flexion 5 4+  Shoulder extension    Shoulder abduction 5 4+  Shoulder adduction    Shoulder extension    Shoulder internal rotation 5 4+  Shoulder external rotation 5 4+  Middle trapezius 4 4-  Lower trapezius 4 3+  Elbow flexion 5 5  Elbow extension 5 5  Wrist flexion 5 5  Wrist extension 5 4  Wrist ulnar deviation    Wrist radial deviation    Wrist pronation    Wrist supination    Grip strength     (Blank rows = not tested)  Grip (lbs) Right eval Left eval  Trial 1 60 40  Trial 2 70 30  Trial 3 65 35  Average 65 35   (Blank rows = not tested)  CERVICAL SPECIAL TESTS:  Upper limb tension test (ULTT): (+) R radial nerve, Spurling's test: (+) R, and Distraction test: worsened the symptoms on the fingers    TREATMENT DATE:  10/01/2023 UBE, forward, level 1, 5'  Median Nerve Gliding 10 rep, 3"  Radial Nerve Glide 10 reps, 3" Upper Trap stretch, bilateral, 1x30" Levator Scap stretch, bilateral, 1x30"  Scapular Retraction , 10x5" holds Posterior Shoulder Rolls, 10x, slow  Manual distraction: negative, does not improve symptoms Manual Upper Trap stretch, pt supine, inc numbness/heaviness bilaterally Grade 1-2 mobs of cervical spine, inc numbness/heaviness bilaterally Trigger point massage of upper traps bilat to pt tolerance, reports feeling some relief in neck musculature  09/17/23 UBE, forward, level 1, 5' Upper trapezius stretch x 30" x 2 Self-SNAGs rotation to the L with a towel x 3" x 10 Supine chin tuck x 3" x 10 x 2 Supine cervical rotation x 3" x 10 on each Standing rows, RTB x 3" x 10 Standing I, RTB x 3" x 10  09/10/23  Cervical ROM all motions 5X each direction Scapular Retraction 10X each 5" holds Neck Retraction  10X  5 sec hold Radial Nerve Glide   3 reps 30 sec  hold Median Nerve Gliding 3 reps 10 sec hold Ulnar Nerve Glide 3 reps 10 sec hold Doorway Pec  Stretch 3X 30 sec hold Shoulder Shrug Circles AROM  10 reps Thoracic excursions with UE movements 5 reps each direction Upper trap stretch 3X20" holds Shoulder IR/ER (flashers) 10X each  08/27/23 Seated:  cervical ROM all motions 5X each direction Scapular Retraction 10X each 5" holds Neck Retraction  5X  5 sec hold Shoulder External and Internal Rotation AROM   10X Upper Trap Stretch  3X  20 sec hold Thoracic excursions with UE movements 5 reps each  08/20/23 Evaluation and patient education done                                                                                                                               PATIENT EDUCATION:  Education details: Educated on the pathoanatomy of cervical pain. Educated on the goals and course of rehab.  Person educated: Patient Education method: Explanation Education comprehension: verbalized understanding  HOME EXERCISE PROGRAM: Access Code: [email or web address] URL: [email or web address] 09/17/2023 - Supine Chin Tuck  - 1 x daily - 7 x weekly - 2 sets - 10 reps - 3 hold - Seated Assisted Cervical Rotation with Towel  - 1-2 x daily - 7 x weekly - 1 sets - 10 reps - 3 hold - Supine Cervical Rotation AROM on Pillow  - 1-2 x daily - 7 x weekly - 1 sets - 10 reps - 3 hold - Standing Row with Anchored Resistance  - 2 x daily - 7 x weekly - 1 sets - 10 reps - 3 hold - Shoulder Extension with Resistance  - 2 x daily - 7 x weekly - 1 sets - 10 reps - 3 hold  Date: 08/27/2023 Prepared by: Emeline Gins  Exercises - Seated Scapular Retraction with External Rotation  - 2 x daily - 7 x weekly - 10 reps - 5 sec hold - Neck Retraction  - 2 x daily - 7 x weekly - 5 reps - 5 sec hold - Standing Shoulder External and Internal Rotation AROM  - 2 x daily - 7 x weekly - 10 reps - Seated Upper Trap Stretch  - 2 x daily - 7 x weekly - 3 reps - 20 sec  hold Cervical AROM exercises each direction 5 reps each Thoracic excursions with UE movements 5 reps each Access Code: [email or web address] URL: [email or web address]  Date: 09/10/2023 Prepared by: Emeline Gins Exercises - Standing Radial Nerve Glide  - 2 x daily - 7 x weekly - 3 reps - 30 sec hold - Median Nerve Gliding: Proximal  - 2 x daily - 7 x weekly - 3 reps - 10 sec hold - Ulnar Nerve Glide- Full Arm  - 2 x daily - 7 x weekly - 3 reps - 10 sec hold - Doorway Pec Stretch at 60 Elevation  - 2 x daily - 7 x weekly - 3 reps - 30 sec hold - Standing Shoulder Shrug Circles AROM Backward  - 2 x daily - 7 x weekly -  10 reps  ASSESSMENT:  CLINICAL IMPRESSION: Patient arrives to session with reports of inc numbness and heaviness in LUE and starting in RUE. Session began with review of HEP, pt requiring some verbal cueing for posture and performance. Patient with good carryover of addition of levator scapulae stretch and posterior shoulder rolls. Can see trial of manual therapy above. Patient benefits most from manual trigger point massage with reports of decreased tension in neck musculature. Patient continues to be limited in her daily activities and hobbies because of this pain. Patient will continue to benefit from skilled PT to address the impairments and improve function.   EVAL: Patient is a 48 y.o. female who was seen today for physical therapy evaluation and treatment for chronic neck pain. Patient's condition is further defined by difficulty bending over and turning the head due to pain, weakness, and decreased soft tissue extensibility. Skilled PT is required to address the impairments and functional limitations listed below.    OBJECTIVE IMPAIRMENTS: decreased mobility, decreased ROM, decreased strength, increased muscle spasms, impaired flexibility, impaired sensation, postural dysfunction, and pain.   ACTIVITY LIMITATIONS: carrying, lifting, bending, and  sleeping  PARTICIPATION LIMITATIONS: meal prep, cleaning, laundry, driving, shopping, community activity, and yard work  PERSONAL FACTORS: Time since onset of injury/illness/exacerbation are also affecting patient's functional outcome.   REHAB POTENTIAL: Fair    CLINICAL DECISION MAKING: Stable/uncomplicated  EVALUATION COMPLEXITY: Low   GOALS: Goals reviewed with patient? Yes  SHORT TERM GOALS: Target date: 09/17/23  Pt will demonstrate indep in HEP to facilitate carry-over of skilled services and improve functional outcomes Goal status: IN PROGRESS  LONG TERM GOALS: Target date: 10/15/23  Pt will demonstrate a decrease in NDI score by 19 % to demonstrate significant improvement in ADLs  Baseline: 48% Goal status: IN PROGRESS  2.  Pt will demonstrate increase in cervical flex and rot by 10 deg  to facilitate ease in ADLs Baseline: see above Goal status: IN PROGRESS  3.  Pt will demonstrate increase in UE strength to 4/5 to facilitate ease in ADLs Baseline: 3+/5 Goal status: IN PROGRESS  4.  Pt will demonstrate an increase in L grip strength by 10-15 lbs to facilitate ease in ADLs Baseline: 35 lbs Goal status: IN PROGRESS  5.  Pt will be able to bend and turn the head with mild pain (3-4/10) to facilitate ease in ADLs Baseline: 5/10 Goal status: IN PROGRESS   PLAN:  PT FREQUENCY: 1x/week  PT DURATION: 8 weeks  PLANNED INTERVENTIONS: 97164- PT Re-evaluation, 97110-Therapeutic exercises, 97530- Therapeutic activity, 97112- Neuromuscular re-education, 97535- Self Care, 91478- Manual therapy, 97116- Gait training, 97014- Electrical stimulation (unattended), Patient/Family education, Taping, Dry Needling, Cryotherapy, and Moist heat  PLAN FOR NEXT SESSION: Progress cervical strengthening and mobility activities. Begin postural strengthening and education next session.  8:30 AM, 10/01/23 Chryl Heck, PT, DPT [facility]

## 2023-10-08 ENCOUNTER — Encounter (HOSPITAL_COMMUNITY): Payer: Self-pay

## 2023-10-08 ENCOUNTER — Other Ambulatory Visit (INDEPENDENT_AMBULATORY_CARE_PROVIDER_SITE_OTHER): Payer: Self-pay | Admitting: Gastroenterology

## 2023-10-08 ENCOUNTER — Ambulatory Visit (HOSPITAL_COMMUNITY): Payer: Medicaid Other

## 2023-10-08 DIAGNOSIS — M542 Cervicalgia: Secondary | ICD-10-CM | POA: Diagnosis not present

## 2023-10-08 NOTE — Telephone Encounter (Signed)
 Patient on Amitiza 24 mcg.

## 2023-10-08 NOTE — Therapy (Addendum)
 OUTPATIENT PHYSICAL THERAPY CERVICAL TREATMENT   Patient Name: [name] MRN: [id number] DOB:1976/01/26, 48 y.o., female Today's Date: 10/08/2023  END OF SESSION:  PT End of Session - 10/08/23 0722     Visit Number 6    Number of Visits 9    Date for PT Re-Evaluation 10/15/23    Authorization Type [place] [employer]    Authorization Time Period 6 visits approved 2/6-10/18/23    Authorization - Visit Number 5    Authorization - Number of Visits 6    Progress Note Due on Visit 6    PT Start Time 0720    PT Stop Time 0756    PT Time Calculation (min) 36 min    Activity Tolerance Patient tolerated treatment well    Behavior During Therapy [facility] for tasks assessed/performed                Past Medical History:  Diagnosis Date   Anxiety    [employer] 12/30/2019   Depression    GERD (gastroesophageal reflux disease)    Hypertension    Past Surgical History:  Procedure Laterality Date   BIOPSY  05/22/2022   Procedure: BIOPSY;  Surgeon: Dolores Frame, MD;  [facility];  Service: Gastroenterology;;   BIOPSY  11/26/2022   Procedure: BIOPSY;  Surgeon: Dolores Frame, MD;  [facility];  Service: Gastroenterology;;   CHOLECYSTECTOMY     COLONOSCOPY WITH PROPOFOL N/A 05/22/2022   Procedure: COLONOSCOPY WITH PROPOFOL;  Surgeon: Dolores Frame, MD;  [facility];  Service: Gastroenterology;  Laterality: N/A;  1215 ASA 2   ESOPHAGOGASTRODUODENOSCOPY (EGD) WITH PROPOFOL N/A 11/26/2022   Procedure: ESOPHAGOGASTRODUODENOSCOPY (EGD) WITH PROPOFOL;  Surgeon: Dolores Frame, MD;  [facility];  Service: Gastroenterology;  Laterality: N/A;  1:00PM;asa 2   POLYPECTOMY  05/22/2022   Procedure: POLYPECTOMY;  Surgeon: Dolores Frame, MD;  [facility];  Service: Gastroenterology;;   TUBAL LIGATION     Patient Active Problem List   Diagnosis Date Noted   Nausea and vomiting 09/08/2023    Hidradenitis suppurativa 08/05/2023   Depression, major, single episode, moderate (HCC) 08/05/2023   Irritable bowel syndrome with constipation 06/09/2023   Mixed stress and urge urinary incontinence 04/07/2023   Neck pain 04/01/2023   Early satiety 03/01/2023   Overactive bladder 11/05/2022   Diarrhea 05/05/2022   Loss of weight 05/05/2022   Vitamin B12 deficiency 04/12/2020   Vitamin D deficiency 04/12/2020   Dizziness 04/12/2020   HTN (hypertension) 12/30/2019   Tobacco abuse 12/30/2019   GERD (gastroesophageal reflux disease) 12/30/2019   Spondylosis 12/30/2019    PCP: Rica Records FNP  REFERRING PROVIDER: Rica Records, FNP  REFERRING DIAG: [phone] (ICD-10-CM) - Chronic neck pain  THERAPY DIAG:  Cervicalgia  Rationale for Evaluation and Treatment: Rehabilitation  ONSET DATE: 2020  SUBJECTIVE:  SUBJECTIVE STATEMENT: Pt reports after last session she felt okay until the next day her pain increased. Reports yesterday that she felt bad. Thinks she slept wrong. But this morning it feels somewhat better than that. Reports 4/10 pain this morning.    EVAL: Arrives to the clinic with neck pain (see below). Used to have a tingling sensation on the L hand but now it's gone. Patient states that she cannot notice if the L hand is weaker than the R. Condition started in 2020 when patient fell at work backwards and lost consciousness. Patient was rushed to the ER with the L UE being swollen and unable to move the L UE. Patient was thought to have stroke but was negative for CVA. Since then, the neck started to hurt. Patient had received epidural injections on the neck which only helped a little. Patient had to stop the injections due to insurance/financial issues.  Since then, condition has gotten worse. Patient went to to the neurologist yesterday and was given meds. MD also told her to try outpatient PT evaluation and management. Hand dominance: Right Pt has been out of work since last year due to c-diff issues. PERTINENT HISTORY:    PAIN:  Are you having pain? Yes: NPRS scale: 5/10 Pain location: neck, upper back, up to the back of L hand Pain description: constant, sharp and shooting Aggravating factors: night, bending over, turning the neck R/L Relieving factors: heating pad, hot showers  PRECAUTIONS: None  RED FLAGS: None     WEIGHT BEARING RESTRICTIONS: No  FALLS:  Has patient fallen in last 6 months? No  LIVING ENVIRONMENT: Lives with: lives with their daughter Lives in: House/apartment Stairs: Yes: External: 5 steps; can reach both Has following equipment at home: None  OCCUPATION: unemployed  PLOF: Independent and Independent with basic ADLs  PATIENT GOALS: "to relieve pain and tension on my neck"  NEXT MD VISIT: March 2025  OBJECTIVE:  Note: Objective measures were completed at Evaluation unless otherwise noted.  DIAGNOSTIC FINDINGS:  02/28/2019 MRI CERVICAL SPINE WITHOUT CONTRAST   TECHNIQUE: Multiplanar, multisequence MR imaging of the cervical spine was performed. No intravenous contrast was administered.   COMPARISON:  None.   FINDINGS: Alignment: Maintained with straightening of lordosis.   Vertebrae: No fracture, evidence of discitis, or bone lesion. Degenerative endplate signal change in C5 eccentric to the right noted.   Cord: Normal signal throughout.   Posterior Fossa, vertebral arteries, paraspinal tissues: Negative.   Disc levels:   C2-3: Shallow disc bulge without stenosis.   C3-4: Small central disc protrusion contacts the ventral cord but the central canal and foramina remain open.   C4-5: Broad-based central protrusion mildly indents the ventral cord. Foramina are open.   C5-6:  Show broad-based disc bulge mildly flattens the ventral cord. Mild bilateral foraminal narrowing is present.   C6-7: Diffuse broad-based disc bulge flattens the cord. Thickening of the posterior longitudinal ligament posterior to C6 also contributes to cord flattening. Moderate to moderately severe bilateral foraminal narrowing due to uncovertebral disease is seen.   C7-T1: Shallow disc bulge.   IMPRESSION: Spondylosis appears worst at C6-7 where a broad-based disc bulge flattens the cord and uncovertebral disease causes moderate to moderately severe bilateral foraminal narrowing. Posterior longitudinal ligament posterior to C6 is thickened and contributes to cord flattening.   Shallow disc bulge at C5-6 mildly flattens the ventral cord.   Broad-based central protrusion mildly indents the ventral cord at C4-5.   Small central disc protrusion at C3-4  contacts the cord without central canal stenosis.    PATIENT SURVEYS:  NDI 24/50 = 48%  COGNITION: Overall cognitive status: Within functional limits for tasks assessed  SENSATION: Light touch: Impaired on the L C6, C8 dermatome, impaired on posterior aspect of L hand  POSTURE: rounded shoulders and forward head  PALPATION: Grade 2 tenderness and moderate spasm on B upper trapezius Moderate restriction on B pectoralis, suboccipitals, upper trapezius, and lev scap   CERVICAL ROM:   Active ROM A/PROM (deg) eval  Flexion 10  Extension 40  Right lateral flexion   Left lateral flexion   Right rotation 40  Left rotation 50   (Blank rows = not tested)  UPPER EXTREMITY ROM:  Active ROM Right eval Left eval  Shoulder flexion [facility]  Shoulder extension    Shoulder abduction [facility]  Shoulder adduction [facility]  Shoulder extension    Shoulder internal rotation    Shoulder external rotation    Elbow flexion WFL WFL  Elbow extension [facility] WFL  Wrist flexion [facility] WFL  Wrist extension [facility] WFL  Wrist ulnar deviation     Wrist radial deviation    Wrist pronation    Wrist supination     (Blank rows = not tested)  UPPER EXTREMITY MMT:  MMT Right eval Left eval  Shoulder flexion 5 4+  Shoulder extension    Shoulder abduction 5 4+  Shoulder adduction    Shoulder extension    Shoulder internal rotation 5 4+  Shoulder external rotation 5 4+  Middle trapezius 4 4-  Lower trapezius 4 3+  Elbow flexion 5 5  Elbow extension 5 5  Wrist flexion 5 5  Wrist extension 5 4  Wrist ulnar deviation    Wrist radial deviation    Wrist pronation    Wrist supination    Grip strength     (Blank rows = not tested)  Grip (lbs) Right eval Left eval  Trial 1 60 40  Trial 2 70 30  Trial 3 65 35  Average 65 35   (Blank rows = not tested)  CERVICAL SPECIAL TESTS:  Upper limb tension test (ULTT): (+) R radial nerve, Spurling's test: (+) R, and Distraction test: worsened the symptoms on the fingers    TREATMENT DATE:  10/08/23 UBE, forward 3' and backward 3', level 1 Median Nerve Gliding 10 rep, bilat 3" holds Cervical ROM all motions, 3x each direction, terminated due to n/t Upper Trap stretch, bilateral, 3x30" Self Trigger point massage with SPC, in bilat upper trap,  Self SNAGs rotation, 10x R and L  10/01/2023 UBE, forward, level 1, 5'  Median Nerve Gliding 10 rep, 3"  Radial Nerve Glide 10 reps, 3" Upper Trap stretch, bilateral, 1x30" Levator Scap stretch, bilateral, 1x30"  Scapular Retraction , 10x5" holds Posterior Shoulder Rolls, 10x, slow  Manual distraction: negative, does not improve symptoms Manual Upper Trap stretch, pt supine, inc numbness/heaviness bilaterally Grade 1-2 mobs of cervical spine, inc numbness/heaviness bilaterally Trigger point massage of upper traps bilat to pt tolerance, reports feeling some relief in neck musculature  09/17/23 UBE, forward, level 1, 5' Upper trapezius stretch x 30" x 2 Self-SNAGs rotation to the L with a towel x 3" x 10 Supine chin tuck x 3" x 10  x 2 Supine cervical rotation x 3" x 10 on each Standing rows, RTB x 3" x 10 Standing I, RTB x 3" x 10  PATIENT EDUCATION:  Education details: Educated on the pathoanatomy of cervical pain. Educated on the goals and course of rehab.  Person educated: Patient Education method: Explanation Education comprehension: verbalized understanding  HOME EXERCISE PROGRAM: Access Code: [email or web address] URL: [email or web address] 09/17/2023 - Supine Chin Tuck  - 1 x daily - 7 x weekly - 2 sets - 10 reps - 3 hold - Seated Assisted Cervical Rotation with Towel  - 1-2 x daily - 7 x weekly - 1 sets - 10 reps - 3 hold - Supine Cervical Rotation AROM on Pillow  - 1-2 x daily - 7 x weekly - 1 sets - 10 reps - 3 hold - Standing Row with Anchored Resistance  - 2 x daily - 7 x weekly - 1 sets - 10 reps - 3 hold - Shoulder Extension with Resistance  - 2 x daily - 7 x weekly - 1 sets - 10 reps - 3 hold  Date: 08/27/2023 Prepared by: Emeline Gins  Exercises - Seated Scapular Retraction with External Rotation  - 2 x daily - 7 x weekly - 10 reps - 5 sec hold - Neck Retraction  - 2 x daily - 7 x weekly - 5 reps - 5 sec hold - Standing Shoulder External and Internal Rotation AROM  - 2 x daily - 7 x weekly - 10 reps - Seated Upper Trap Stretch  - 2 x daily - 7 x weekly - 3 reps - 20 sec hold Cervical AROM exercises each direction 5 reps each Thoracic excursions with UE movements 5 reps each Access Code: [email or web address] URL: [email or web address]  Date: 09/10/2023 Prepared by: Emeline Gins Exercises - Standing Radial Nerve Glide  - 2 x daily - 7 x weekly - 3 reps - 30 sec hold - Median Nerve Gliding: Proximal  - 2 x daily - 7 x weekly - 3 reps - 10 sec hold - Ulnar Nerve Glide- Full Arm  - 2 x daily - 7 x weekly - 3 reps - 10 sec hold - Doorway Pec Stretch at 60 Elevation  - 2 x daily - 7 x weekly - 3 reps - 30 sec hold - Standing Shoulder Shrug Circles  AROM Backward  - 2 x daily - 7 x weekly - 10 reps  ASSESSMENT:  CLINICAL IMPRESSION: Patient tolerates session well. On this date, she reports with overall decreased pain than previous session. Following time spent on UBE, patient experiences numbness /tingling down both UE to fingers. Patient reports transient dizziness and lightheadedness following gentle cervical ROM in all directions. BP reading 140/80 with pt reporting that she hasn't taken her BP medicine yet this am and history of vertigo. Remainder of session focused on gentle ROM to patient tolerance and self trigger point massage which patient tolerates well, reports it feels "good". Added to HEP. Patient will continue to benefit from skilled PT to address the impairments and improve function.   EVAL: Patient is a 48 y.o. female who was seen today for physical therapy evaluation and treatment for chronic neck pain. Patient's condition is further defined by difficulty bending over and turning the head due to pain, weakness, and decreased soft tissue extensibility. Skilled PT is required to address the impairments and functional limitations listed below.    OBJECTIVE IMPAIRMENTS: decreased mobility, decreased ROM, decreased strength, increased muscle spasms, impaired flexibility, impaired sensation, postural dysfunction, and pain.   ACTIVITY LIMITATIONS: carrying, lifting, bending, and sleeping  PARTICIPATION LIMITATIONS: meal prep, cleaning, laundry, driving, shopping, community activity, and yard work  PERSONAL FACTORS: Time since onset of injury/illness/exacerbation are also affecting patient's functional outcome.   REHAB POTENTIAL: Fair    CLINICAL DECISION MAKING: Stable/uncomplicated  EVALUATION COMPLEXITY: Low   GOALS: Goals reviewed with patient? Yes  SHORT TERM GOALS: Target date: 09/17/23  Pt will demonstrate indep in HEP to facilitate carry-over of skilled services and improve functional outcomes Goal status: IN  PROGRESS  LONG TERM GOALS: Target date: 10/15/23  Pt will demonstrate a decrease in NDI score by 19 % to demonstrate significant improvement in ADLs  Baseline: 48% Goal status: IN PROGRESS  2.  Pt will demonstrate increase in cervical flex and rot by 10 deg  to facilitate ease in ADLs Baseline: see above Goal status: IN PROGRESS  3.  Pt will demonstrate increase in UE strength to 4/5 to facilitate ease in ADLs Baseline: 3+/5 Goal status: IN PROGRESS  4.  Pt will demonstrate an increase in L grip strength by 10-15 lbs to facilitate ease in ADLs Baseline: 35 lbs Goal status: IN PROGRESS  5.  Pt will be able to bend and turn the head with mild pain (3-4/10) to facilitate ease in ADLs Baseline: 5/10 Goal status: IN PROGRESS   PLAN:  PT FREQUENCY: 1x/week  PT DURATION: 8 weeks  PLANNED INTERVENTIONS: 97164- PT Re-evaluation, 97110-Therapeutic exercises, 97530- Therapeutic activity, 97112- Neuromuscular re-education, 97535- Self Care, 16109- Manual therapy, 97116- Gait training, 97014- Electrical stimulation (unattended), Patient/Family education, Taping, Dry Needling, Cryotherapy, and Moist heat  PLAN FOR NEXT SESSION: Progress cervical strengthening and mobility activities. Begin postural strengthening and education next session.    8:27 AM, 10/08/23 Chryl Heck, PT, DPT [facility] [place]

## 2023-10-10 ENCOUNTER — Other Ambulatory Visit: Payer: Self-pay | Admitting: Family Medicine

## 2023-10-10 DIAGNOSIS — I10 Essential (primary) hypertension: Secondary | ICD-10-CM

## 2023-10-14 ENCOUNTER — Other Ambulatory Visit (INDEPENDENT_AMBULATORY_CARE_PROVIDER_SITE_OTHER): Payer: Self-pay | Admitting: Gastroenterology

## 2023-10-14 ENCOUNTER — Telehealth (INDEPENDENT_AMBULATORY_CARE_PROVIDER_SITE_OTHER): Payer: Self-pay

## 2023-10-14 MED ORDER — LINACLOTIDE 145 MCG PO CAPS
145.0000 ug | ORAL_CAPSULE | Freq: Every day | ORAL | 5 refills | Status: AC
Start: 2023-10-14 — End: ?

## 2023-10-14 NOTE — Telephone Encounter (Signed)
 Patient calling today says the Amitiza 24 mcg bid is not helping her issues with constipation. She says her medication was increased from Amitzia 8 mcg bid to Amitiza 24 mcg bid. This was helping for a while,but she says she had not had a bm since last Saturday, and she has felt the need to go,but when she goes nothing comes out. She reports she was at her daughters house today and she had given her one of her Linzess 145 mcg and with in an hour she had a large bm. Please advise, if you want to change her to a different medications she uses [facility] [place]. Thanks,

## 2023-10-14 NOTE — Telephone Encounter (Signed)
 Ok, so do I need to tell her to take the Amitiza 24 mcg bid and the Linzess 145 mg daily?

## 2023-10-14 NOTE — Telephone Encounter (Signed)
 I spoke with the patient and she is aware per [facility],  She can try just doing the linzess daily by itself, however, if this does not provide results, will need to take both this and the amitiza  Patient states understanding.

## 2023-10-15 ENCOUNTER — Ambulatory Visit (HOSPITAL_COMMUNITY): Payer: Medicaid Other | Attending: Family Medicine

## 2023-10-15 DIAGNOSIS — M542 Cervicalgia: Secondary | ICD-10-CM | POA: Diagnosis not present

## 2023-10-15 NOTE — Therapy (Signed)
 OUTPATIENT PHYSICAL THERAPY CERVICAL TREATMENT/PROGRESS NOTE/DISCHARGE Progress Note Reporting Period 08/20/23 to 10/15/2023  See note below for Objective Data and Assessment of Progress/Goals.   PHYSICAL THERAPY DISCHARGE SUMMARY  Visits from Start of Care: 7  Current functional level related to goals / functional outcomes: See below   Remaining deficits: See below   Education / Equipment: HEP   Patient agrees to discharge. Patient goals were partially met. Patient is being discharged due to being pleased with the current functional level.       Patient Name: [name] MRN: [id number] DOB:1975/09/14, 48 y.o., female Today's Date: 10/15/2023  END OF SESSION:  PT End of Session - 10/15/23 0716     Visit Number 7    Number of Visits 9    Date for PT Re-Evaluation 10/15/23    Authorization Type [place] Medicaid    Authorization Time Period 6 visits approved 2/6-10/18/23    Authorization - Visit Number 6    Authorization - Number of Visits 6    Progress Note Due on Visit 6    PT Start Time 0716    PT Stop Time 0758    PT Time Calculation (min) 42 min    Activity Tolerance Patient tolerated treatment well    Behavior During Therapy [facility] for tasks assessed/performed                Past Medical History:  Diagnosis Date   Anxiety    Phreesia 12/30/2019   Depression    GERD (gastroesophageal reflux disease)    Hypertension    Past Surgical History:  Procedure Laterality Date   BIOPSY  05/22/2022   Procedure: BIOPSY;  Surgeon: Dolores Frame, MD;  [facility];  Service: Gastroenterology;;   BIOPSY  11/26/2022   Procedure: BIOPSY;  Surgeon: Dolores Frame, MD;  [facility];  Service: Gastroenterology;;   CHOLECYSTECTOMY     COLONOSCOPY WITH PROPOFOL N/A 05/22/2022   Procedure: COLONOSCOPY WITH PROPOFOL;  Surgeon: Dolores Frame, MD;  [facility];  Service: Gastroenterology;  Laterality: N/A;   1215 ASA 2   ESOPHAGOGASTRODUODENOSCOPY (EGD) WITH PROPOFOL N/A 11/26/2022   Procedure: ESOPHAGOGASTRODUODENOSCOPY (EGD) WITH PROPOFOL;  Surgeon: Dolores Frame, MD;  [facility];  Service: Gastroenterology;  Laterality: N/A;  1:00PM;asa 2   POLYPECTOMY  05/22/2022   Procedure: POLYPECTOMY;  Surgeon: Dolores Frame, MD;  [facility];  Service: Gastroenterology;;   TUBAL LIGATION     Patient Active Problem List   Diagnosis Date Noted   Nausea and vomiting 09/08/2023   Hidradenitis suppurativa 08/05/2023   Depression, major, single episode, moderate (HCC) 08/05/2023   Irritable bowel syndrome with constipation 06/09/2023   Mixed stress and urge urinary incontinence 04/07/2023   Neck pain 04/01/2023   Early satiety 03/01/2023   Overactive bladder 11/05/2022   Diarrhea 05/05/2022   Loss of weight 05/05/2022   Vitamin B12 deficiency 04/12/2020   Vitamin D deficiency 04/12/2020   Dizziness 04/12/2020   HTN (hypertension) 12/30/2019   Tobacco abuse 12/30/2019   GERD (gastroesophageal reflux disease) 12/30/2019   Spondylosis 12/30/2019    PCP: Rica Records FNP  REFERRING PROVIDER: Rica Records, FNP  REFERRING DIAG: [phone] (ICD-10-CM) - Chronic neck pain  THERAPY DIAG:  Cervicalgia  Rationale for Evaluation and Treatment: Rehabilitation  ONSET DATE: 2020  SUBJECTIVE:  SUBJECTIVE STATEMENT: Minimal change overall; can maybe move a little bit better.  Still has trouble sleeping at night.  Pain is not quite as intense; maybe 40% better.  She has been noticing some increased weakness left hand; dropping things.  She sees her neurologist tomorrow.    EVAL: Arrives to the clinic with neck pain (see below). Used to  have a tingling sensation on the L hand but now it's gone. Patient states that she cannot notice if the L hand is weaker than the R. Condition started in 2020 when patient fell at work backwards and lost consciousness. Patient was rushed to the ER with the L UE being swollen and unable to move the L UE. Patient was thought to have stroke but was negative for CVA. Since then, the neck started to hurt. Patient had received epidural injections on the neck which only helped a little. Patient had to stop the injections due to insurance/financial issues. Since then, condition has gotten worse. Patient went to to the neurologist yesterday and was given meds. MD also told her to try outpatient PT evaluation and management. Hand dominance: Right Pt has been out of work since last year due to c-diff issues. PERTINENT HISTORY:    PAIN:  Are you having pain? Yes: NPRS scale: 5/10 Pain location: neck, upper back, up to the back of L hand Pain description: constant, sharp and shooting Aggravating factors: night, bending over, turning the neck R/L Relieving factors: heating pad, hot showers  PRECAUTIONS: None  RED FLAGS: None     WEIGHT BEARING RESTRICTIONS: No  FALLS:  Has patient fallen in last 6 months? No  LIVING ENVIRONMENT: Lives with: lives with their daughter Lives in: House/apartment Stairs: Yes: External: 5 steps; can reach both Has following equipment at home: None  OCCUPATION: unemployed  PLOF: Independent and Independent with basic ADLs  PATIENT GOALS: "to relieve pain and tension on my neck"  NEXT MD VISIT: March 2025  OBJECTIVE:  Note: Objective measures were completed at Evaluation unless otherwise noted.  DIAGNOSTIC FINDINGS:  02/28/2019 MRI CERVICAL SPINE WITHOUT CONTRAST   TECHNIQUE: Multiplanar, multisequence MR imaging of the cervical spine was performed. No intravenous contrast was administered.   COMPARISON:  None.   FINDINGS: Alignment: Maintained with  straightening of lordosis.   Vertebrae: No fracture, evidence of discitis, or bone lesion. Degenerative endplate signal change in C5 eccentric to the right noted.   Cord: Normal signal throughout.   Posterior Fossa, vertebral arteries, paraspinal tissues: Negative.   Disc levels:   C2-3: Shallow disc bulge without stenosis.   C3-4: Small central disc protrusion contacts the ventral cord but the central canal and foramina remain open.   C4-5: Broad-based central protrusion mildly indents the ventral cord. Foramina are open.   C5-6: Show broad-based disc bulge mildly flattens the ventral cord. Mild bilateral foraminal narrowing is present.   C6-7: Diffuse broad-based disc bulge flattens the cord. Thickening of the posterior longitudinal ligament posterior to C6 also contributes to cord flattening. Moderate to moderately severe bilateral foraminal narrowing due to uncovertebral disease is seen.   C7-T1: Shallow disc bulge.   IMPRESSION: Spondylosis appears worst at C6-7 where a broad-based disc bulge flattens the cord and uncovertebral disease causes moderate to moderately severe bilateral foraminal narrowing. Posterior longitudinal ligament posterior to C6 is thickened and contributes to cord flattening.   Shallow disc bulge at C5-6 mildly flattens the ventral cord.   Broad-based central protrusion mildly indents the ventral cord at C4-5.  Small central disc protrusion at C3-4 contacts the cord without central canal stenosis.    PATIENT SURVEYS:  NDI 24/50 = 48%  COGNITION: Overall cognitive status: Within functional limits for tasks assessed  SENSATION: Light touch: Impaired on the L C6, C8 dermatome, impaired on posterior aspect of L hand  POSTURE: rounded shoulders and forward head  PALPATION: Grade 2 tenderness and moderate spasm on B upper trapezius Moderate restriction on B pectoralis, suboccipitals, upper trapezius, and lev scap   CERVICAL ROM:    Active ROM A/PROM (deg) eval AROM 10/15/23  Flexion 10 32  Extension 40 37  Right lateral flexion    Left lateral flexion    Right rotation 40 52  Left rotation 50 51   (Blank rows = not tested)  UPPER EXTREMITY ROM:  Active ROM Right eval Left eval  Shoulder flexion [facility]  Shoulder extension    Shoulder abduction [facility]  Shoulder adduction [facility]  Shoulder extension    Shoulder internal rotation    Shoulder external rotation    Elbow flexion [facility] WFL  Elbow extension [facility]  Wrist flexion [facility] WFL  Wrist extension [facility] WFL  Wrist ulnar deviation    Wrist radial deviation    Wrist pronation    Wrist supination     (Blank rows = not tested)  UPPER EXTREMITY MMT:  MMT Right eval Left eval Left 10/15/23  Shoulder flexion 5 4+ 4+  Shoulder extension     Shoulder abduction 5 4+ 4+  Shoulder adduction     Shoulder extension     Shoulder internal rotation 5 4+ 4+  Shoulder external rotation 5 4+ 4+  Middle trapezius 4 4-   Lower trapezius 4 3+   Elbow flexion 5 5   Elbow extension 5 5   Wrist flexion 5 5   Wrist extension 5 4 4   Wrist ulnar deviation     Wrist radial deviation     Wrist pronation     Wrist supination     Grip strength      (Blank rows = not tested)  Grip (lbs) Right eval Left eval Left 10/15/23  Trial 1 60 40 22  Trial 2 70 30 20  Trial 3 65 35 24  Average 65 35 22   (Blank rows = not tested)  CERVICAL SPECIAL TESTS:  Upper limb tension test (ULTT): (+) R radial nerve, Spurling's test: (+) R, and Distraction test: worsened the symptoms on the fingers    TREATMENT DATE:  10/15/23 UBE dynamic warm up 2' forward and 2' backwards Progress note NDI 33/50 66% AROM of cervical spine see above MMTs upper extremity see above Grip strength testing see above   10/08/23 UBE, forward 3' and backward 3', level 1 Median Nerve Gliding 10 rep, bilat 3" holds Cervical ROM all motions, 3x each direction, terminated due to n/t Upper Trap  stretch, bilateral, 3x30" Self Trigger point massage with SPC, in bilat upper trap,  Self SNAGs rotation, 10x R and L  10/01/2023 UBE, forward, level 1, 5'  Median Nerve Gliding 10 rep, 3"  Radial Nerve Glide 10 reps, 3" Upper Trap stretch, bilateral, 1x30" Levator Scap stretch, bilateral, 1x30"  Scapular Retraction , 10x5" holds Posterior Shoulder Rolls, 10x, slow  Manual distraction: negative, does not improve symptoms Manual Upper Trap stretch, pt supine, inc numbness/heaviness bilaterally Grade 1-2 mobs of cervical spine, inc numbness/heaviness bilaterally Trigger point massage of upper traps bilat to pt tolerance, reports feeling some relief  in neck musculature  09/17/23 UBE, forward, level 1, 5' Upper trapezius stretch x 30" x 2 Self-SNAGs rotation to the L with a towel x 3" x 10 Supine chin tuck x 3" x 10 x 2 Supine cervical rotation x 3" x 10 on each Standing rows, RTB x 3" x 10 Standing I, RTB x 3" x 10                                           PATIENT EDUCATION:  Education details: Educated on the pathoanatomy of cervical pain. Educated on the goals and course of rehab.  Person educated: Patient Education method: Explanation Education comprehension: verbalized understanding  HOME EXERCISE PROGRAM: Access Code: [email or web address] URL: [email or web address] 09/17/2023 - Supine Chin Tuck  - 1 x daily - 7 x weekly - 2 sets - 10 reps - 3 hold - Seated Assisted Cervical Rotation with Towel  - 1-2 x daily - 7 x weekly - 1 sets - 10 reps - 3 hold - Supine Cervical Rotation AROM on Pillow  - 1-2 x daily - 7 x weekly - 1 sets - 10 reps - 3 hold - Standing Row with Anchored Resistance  - 2 x daily - 7 x weekly - 1 sets - 10 reps - 3 hold - Shoulder Extension with Resistance  - 2 x daily - 7 x weekly - 1 sets - 10 reps - 3 hold  Date: 08/27/2023 Prepared by: Emeline Gins  Exercises - Seated Scapular Retraction with External Rotation  - 2 x daily - 7 x weekly - 10 reps  - 5 sec hold - Neck Retraction  - 2 x daily - 7 x weekly - 5 reps - 5 sec hold - Standing Shoulder External and Internal Rotation AROM  - 2 x daily - 7 x weekly - 10 reps - Seated Upper Trap Stretch  - 2 x daily - 7 x weekly - 3 reps - 20 sec hold Cervical AROM exercises each direction 5 reps each Thoracic excursions with UE movements 5 reps each Access Code: [email or web address] URL: [email or web address]  Date: 09/10/2023 Prepared by: Emeline Gins Exercises - Standing Radial Nerve Glide  - 2 x daily - 7 x weekly - 3 reps - 30 sec hold - Median Nerve Gliding: Proximal  - 2 x daily - 7 x weekly - 3 reps - 10 sec hold - Ulnar Nerve Glide- Full Arm  - 2 x daily - 7 x weekly - 3 reps - 10 sec hold - Doorway Pec Stretch at 60 Elevation  - 2 x daily - 7 x weekly - 3 reps - 30 sec hold - Standing Shoulder Shrug Circles AROM Backward  - 2 x daily - 7 x weekly - 10 reps  ASSESSMENT:  CLINICAL IMPRESSION: Progress note today.  Good improvement with cervical mobility but noted weakness and decline in left hand grip strength.  Patient continues with significant pain levels although lessened.  Patient met 3/6 set rehab goals and is agreeable to discharge at this time.     EVAL: Patient is a 48 y.o. female who was seen today for physical therapy evaluation and treatment for chronic neck pain. Patient's condition is further defined by difficulty bending over and turning the head due to pain, weakness, and decreased soft tissue extensibility. Skilled PT is required to address the impairments and functional limitations listed below.  OBJECTIVE IMPAIRMENTS: decreased mobility, decreased ROM, decreased strength, increased muscle spasms, impaired flexibility, impaired sensation, postural dysfunction, and pain.   ACTIVITY LIMITATIONS: carrying, lifting, bending, and sleeping  PARTICIPATION LIMITATIONS: meal prep, cleaning, laundry, driving, shopping, community activity, and yard work  PERSONAL  FACTORS: Time since onset of injury/illness/exacerbation are also affecting patient's functional outcome.   REHAB POTENTIAL: Fair    CLINICAL DECISION MAKING: Stable/uncomplicated  EVALUATION COMPLEXITY: Low   GOALS: Goals reviewed with patient? Yes  SHORT TERM GOALS: Target date: 09/17/23  Pt will demonstrate indep in HEP to facilitate carry-over of skilled services and improve functional outcomes Goal status: met  LONG TERM GOALS: Target date: 10/15/23  Pt will demonstrate a decrease in NDI score by 19 % to demonstrate significant improvement in ADLs  Baseline: 48%; 66% 10/15/23 Goal status: IN PROGRESS  2.  Pt will demonstrate increase in cervical flex and rot by 10 deg  to facilitate ease in ADLs Baseline: see above Goal status: met  3.  Pt will demonstrate increase in UE strength to 4/5 to facilitate ease in ADLs Baseline: 3+/5 Goal status: met  4.  Pt will demonstrate an increase in L grip strength by 10-15 lbs to facilitate ease in ADLs Baseline: 35 lbs; 22lbs 10/15/23 Goal status: IN PROGRESS  5.  Pt will be able to bend and turn the head with mild pain (3-4/10) to facilitate ease in ADLs Baseline: 5/10; still moderate 10/15/23 Goal status: IN PROGRESS   PLAN:  PT FREQUENCY: 1x/week  PT DURATION: 8 weeks  PLANNED INTERVENTIONS: 97164- PT Re-evaluation, 97110-Therapeutic exercises, 97530- Therapeutic activity, 97112- Neuromuscular re-education, 97535- Self Care, 40981- Manual therapy, 97116- Gait training, 97014- Electrical stimulation (unattended), Patient/Family education, Taping, Dry Needling, Cryotherapy, and Moist heat  PLAN FOR NEXT SESSION: discharge  7:21 AM, 10/15/23 Juel Bellerose Small Sirenia Whitis MPT [place] [facility] [place] [phone] [phone]

## 2023-10-16 ENCOUNTER — Encounter: Payer: Self-pay | Admitting: Physician Assistant

## 2023-10-16 ENCOUNTER — Ambulatory Visit: Admitting: Physician Assistant

## 2023-10-16 VITALS — BP 102/74 | Ht 63.0 in | Wt 211.0 lb

## 2023-10-16 DIAGNOSIS — M5010 Cervical disc disorder with radiculopathy, unspecified cervical region: Secondary | ICD-10-CM

## 2023-10-16 DIAGNOSIS — M4802 Spinal stenosis, cervical region: Secondary | ICD-10-CM | POA: Diagnosis not present

## 2023-10-16 DIAGNOSIS — M50123 Cervical disc disorder at C6-C7 level with radiculopathy: Secondary | ICD-10-CM

## 2023-10-16 MED ORDER — GABAPENTIN 300 MG PO CAPS: 600.0000 mg | ORAL_CAPSULE | Freq: Three times a day (TID) | ORAL | 1 refills | Status: DC

## 2023-10-16 MED ORDER — METHYLPREDNISOLONE 4 MG PO TBPK: ORAL_TABLET | ORAL | 0 refills | Status: DC

## 2023-10-16 NOTE — Progress Notes (Signed)
 Progress Note: Referring Physician:  No referring provider defined for this encounter.  Primary Physician:  Rica Records, FNP  Chief Complaint:   Neck pain radiates down to the middle of her back and between her shoulder blades as well as down her left arm and into her hand.  She states this is a burning, sharp pain.  She states it is greatly affecting her sleep as also had some difficulty walking.  She adds that her pain is worse when she turns her head and she will get pain as a result.  She feels as though her left hand is weak.  She has been taking Flexeril as well as over-the-counter pain medication which she feels as though has provided little relief.    History of Present Illness: [name] is a 48 y.o. female who returns today for continued neck pain.  She states at this point she has been discharged from physical therapy, but is still experiencing neck pain that radiates down bilateral upper extremities.  She adds that she feels as though she has needles at the top of her left hand and tingling in her right hand.  She has neck pain anytime she turns her head and she feels that she is not sleeping well.  Her pain is exacerbated the most when she is up doing dishes and gets burning pain in the back of her neck and down her arms.  She also feels as though her hands " fall asleep" while she is sleeping.  The steroid Dosepak that she was given did improve her symptoms.  However, she feels as though her hands are getting weaker including her grip which was also noted by her physical therapist.  She is not able to turn lids or take the Off of the jar any longer.  She also feels as though her gait is changing, but denies any recent falls.  Of note she does have a pack-year history in excess of 60 years.  She continues to smoke a pack a day.  For pain she has been using a muscle relaxer as needed.    Exam: Today's Vitals   10/16/23 1000  BP: 102/74  Weight: 211 lb (95.7  kg)  Height: 5\' 3"  (1.6 m)  PainSc: 9   PainLoc: Hand   Body mass index is 37.38 kg/m.    NEUROLOGICAL:  General: In no acute distress.   Awake, alert, oriented to person, place, and time.  Pupils equal round and reactive to light.  Facial tone is symmetric.   ROM of spine: Some decreased range of motion of her cervical spine, she has some mild tenderness palpation.    +spurlings  Positive Wartenberg's sign on the left hand. Positive Tinel of left ulnar nerve.   Strength: Side Biceps Triceps Deltoid Interossei Grip Wrist Ext. Wrist Flex.  R 5 5 5 5 5 5 5   L 4+ 4+ 4+ 3 4-  4 4     Imaging: MRI Cervical Spine (08/2023):   Disc levels:   C2-C3:  No stenosis.   C3-C4: Trace central disc protrusion. No significant stenosis. Appearance is similar.   C4-C5: Small central disc protrusion. No significant stenosis. Appearance is similar.   C5-C6: Disc bulge with endplate osteophytes and superimposed new shallow left central protrusion. Uncovertebral hypertrophy. Mild to moderate canal stenosis. Mild foraminal stenosis.   C6-C7: Disc bulge with endplate osteophytes with superimposed shallow central extrusion extending below disc level. Uncovertebral hypertrophy. Moderate to marked canal and  foraminal stenosis. Appearance is similar.   C7-T1:  Minimal disc bulge.  No significant stenosis.   IMPRESSION: Multilevel degenerative changes as detailed above are similar to the 2020 study. As before, canal and foraminal stenosis are greatest at C6-C7.  Cervical spine 4 view 08/2023: FINDINGS: No fracture, dislocation or subluxation. No spondylolisthesis. No osteolytic or osteoblastic changes. Prevertebral and cervical cranial soft tissues are unremarkable. No motion with flexion and extension to suggest instability.   Degenerative disc disease noted with disc space narrowing and marginal osteophytes at C6-7.   IMPRESSION: Degenerative changes. No acute osseous  abnormalities.        I have personally reviewed the images and agree with the above interpretation.  Assessment and Plan: [name] is a pleasant 48 y.o. female who returns today for continued neck pain.  She states at this point she has been discharged from physical therapy, but is still experiencing neck pain that radiates down bilateral upper extremities.  She adds that she feels as though she has needles at the top of her left hand and tingling in her right hand.  She has neck pain anytime she turns her head and she feels that she is not sleeping well.  Her pain is exacerbated the most when she is up doing dishes and gets burning pain in the back of her neck and down her arms.  She also feels as though her hands " fall asleep" while she is sleeping.  The steroid Dosepak that she was given did improve her symptoms.  However, she feels as though her hands are getting weaker including her grip which was also noted by her physical therapist.  She is not able to turn lids or take the Off of the jar any longer.  She also feels as though her gait is changing, but denies any recent falls.  On examination her motor function, specifically in her left upper extremity has worsened most notably in her interosseous function.  Positive Spurling's,positive Wartenberg's sign and Tinel of ulnar nerve on the left upper extremity.  Most recent MRI was reviewed most notable for disc bulge at C6-7 with marked central canal and foraminal stenosis.  It was a pleasure to see this patient in clinic today.  I am concerned that despite physical therapy, patient's function is becoming worse.  Potential candidate for a cervical arthroplasty.  Would like her to discuss this further with surgeon, Dr. Katrinka Blazing.  Will arrange this follow-up as soon as possible.  Advised patient to continue conservative management and use steroid Dosepak as well as increase gabapentin dosage was sent to her pharmacy of choice.  Red flag symptoms were  discussed.   I spent a total of 45 minutes in both face-to-face and non-face-to-face activities for this visit on the date of this encounter including preparing to see the patient and reviewing her most recent imaging, performing appropriate new examination, counseling the patient, documenting, and care coordination, as well as medication changes.  Joan Flores PA-C Neurosurgery

## 2023-10-19 NOTE — Progress Notes (Unsigned)
 Referring Physician:  Rica Records, FNP [phone] [address] [place],  [place] [address]  Primary Physician:  Rica Records, FNP  History of Present Illness: 10/21/2023 [name] is here today with a chief complaint of cervical myeloradiculopathy.  She has been seen and discharged from physical therapy on/09/2023.  Continues to have bilateral neck and arm pain.  Pins and needle sensation in her bilateral hands and feet.  Her hands continue to weaken.  She also feels unsteady on her feet while walking.  She states that she does smoke but she has been cutting down  Past Surgery: no spinal surgeries  The symptoms are causing a significant impact on the patient's life.   I have utilized the care everywhere function in epic to review the outside records available from external health systems.  Review of Systems:  A 10 point review of systems is negative, except for the pertinent positives and negatives detailed in the HPI.  Past Medical History: Past Medical History:  Diagnosis Date   Anxiety    Phreesia 12/30/2019   Depression    GERD (gastroesophageal reflux disease)    Hypertension     Past Surgical History: Past Surgical History:  Procedure Laterality Date   BIOPSY  05/22/2022   Procedure: BIOPSY;  Surgeon: Dolores Frame, MD;  Location: [facility];  Service: Gastroenterology;;   BIOPSY  11/26/2022   Procedure: BIOPSY;  Surgeon: Dolores Frame, MD;  Location: [facility];  Service: Gastroenterology;;   CHOLECYSTECTOMY     COLONOSCOPY WITH PROPOFOL N/A 05/22/2022   Procedure: COLONOSCOPY WITH PROPOFOL;  Surgeon: Dolores Frame, MD;  Location: [facility];  Service: Gastroenterology;  Laterality: N/A;  1215 ASA 2   ESOPHAGOGASTRODUODENOSCOPY (EGD) WITH PROPOFOL N/A 11/26/2022   Procedure: ESOPHAGOGASTRODUODENOSCOPY (EGD) WITH PROPOFOL;  Surgeon: Dolores Frame, MD;  [facility]: [facility];   Service: Gastroenterology;  Laterality: N/A;  1:00PM;asa 2   POLYPECTOMY  05/22/2022   Procedure: POLYPECTOMY;  Surgeon: Dolores Frame, MD;  Location: [facility];  Service: Gastroenterology;;   TUBAL LIGATION      Allergies: Allergies as of 10/21/2023 - Review Complete 10/21/2023  Allergen Reaction Noted   Other  09/13/2012    Medications:  Current Outpatient Medications:    albuterol (VENTOLIN HFA) 108 (90 Base) MCG/ACT inhaler, Inhale 2 puffs into the lungs every 6 (six) hours as needed for wheezing or shortness of breath., Disp: 8 g, Rfl: 2   amLODipine (NORVASC) 10 MG tablet, Take 1 tablet (10 mg total) by mouth daily., Disp: 90 tablet, Rfl: 1   Cholecalciferol (VITAMIN D) 125 MCG (5000 UT) CAPS, Take 5,000 Units by mouth daily., Disp: , Rfl:    cyclobenzaprine (FLEXERIL) 10 MG tablet, Take 1 tablet (10 mg total) by mouth 2 (two) times daily as needed for muscle spasms., Disp: 60 tablet, Rfl: 2   dicyclomine (BENTYL) 10 MG capsule, Take 1 capsule (10 mg total) by mouth every 12 (twelve) hours as needed for spasms (abdominal pain)., Disp: 180 capsule, Rfl: 1   escitalopram (LEXAPRO) 10 MG tablet, Take 1 tablet (10 mg total) by mouth daily., Disp: 30 tablet, Rfl: 3   gabapentin (NEURONTIN) 300 MG capsule, Take 2 capsules (600 mg total) by mouth 3 (three) times daily., Disp: 180 capsule, Rfl: 1   linaclotide (LINZESS) 145 MCG CAPS capsule, Take 1 capsule (145 mcg total) by mouth daily., Disp: 30 capsule, Rfl: 5   methylPREDNISolone (  MEDROL DOSEPAK) 4 MG TBPK tablet, Take by mouth daily, taper daily dose per package instructions., Disp: 21 tablet, Rfl: 0   metoprolol tartrate (LOPRESSOR) 50 MG tablet, Take 1 tablet by mouth twice daily, Disp: 90 tablet, Rfl: 0   Omega-3 Fatty Acids (FISH OIL) 1000 MG CAPS, Take 2 capsules (2,000 mg total) by mouth in the morning and at bedtime., Disp: 90 capsule, Rfl: 3   omeprazole (PRILOSEC) 40 MG capsule, Take 1 capsule by mouth once daily,  Disp: 60 capsule, Rfl: 0   ondansetron (ZOFRAN) 4 MG tablet, Take 1 tablet (4 mg total) by mouth every 8 (eight) hours as needed for nausea or vomiting., Disp: 180 tablet, Rfl: 1   oxybutynin (DITROPAN-XL) 10 MG 24 hr tablet, Take 2 tablets (20 mg total) by mouth at bedtime., Disp: 180 tablet, Rfl: 3   rosuvastatin (CRESTOR) 5 MG tablet, Take 1 tablet (5 mg total) by mouth daily., Disp: 90 tablet, Rfl: 3   vitamin B-12 (CYANOCOBALAMIN) 1000 MCG tablet, Take 1 tablet (1,000 mcg total) by mouth daily., Disp: 60 tablet, Rfl: 1  Social History: Social History   Tobacco Use   Smoking status: Every Day    Current packs/day: 1.50    Average packs/day: 1.5 packs/day for 34.0 years (51.0 ttl pk-yrs)    Types: Cigarettes    Passive exposure: Current   Smokeless tobacco: Never  Vaping Use   Vaping status: Never Used  Substance Use Topics   Alcohol use: Not Currently   Drug use: No    Family Medical History: Family History  Problem Relation Age of Onset   Hypertension Father    Hypercholesterolemia Father    COPD Mother    Emphysema Mother     Physical Examination: Vitals:   10/21/23 1412  BP: 122/82    General: Patient is in no apparent distress. Attention to examination is appropriate.  Neck:   Supple.  Somewhat limited range of motion, becomes symptomatic with excess extension or flexion.  Respiratory: Patient is breathing without any difficulty.   NEUROLOGICAL:     Awake, alert, oriented to person, place, and time.  Speech is clear and fluent.   Cranial Nerves: Pupils equal round and reactive to light.  Facial tone is symmetric.  Facial sensation is symmetric. Shoulder shrug is symmetric. Tongue protrusion is midline.    Strength:  Side Biceps Triceps Deltoid Interossei Grip Wrist Ext. Wrist Flex.  R 5 5 5 5 5 5 5   L 4+ 4+ 4+ 3 4-  4 4    Side Iliopsoas Quads Hamstring PF DF EHL  R 5 5 5 5 5 5   L 4 4+ 4 4+ 3 4-   Reflexes are hyperreflexic with spreading reflexes  bilaterally.  Hoffmann sign positive, Tromner sign positive.   She has decreased sensation in the bilateral upper and lower extremities both, with a distal predominance     Does appear slightly unsteady with walking.  Imaging: Narrative & Impression  CLINICAL DATA:  Cervical radiculopathy, no red flags   EXAM: MRI CERVICAL SPINE WITHOUT CONTRAST   TECHNIQUE: Multiplanar, multisequence MR imaging of the cervical spine was performed. No intravenous contrast was administered.   COMPARISON:  2020   FINDINGS: Alignment: Stable.   Vertebrae: Stable vertebral body heights. Degenerative endplate irregularity at C5-C6 and C6-C7. There is mild associated marrow edema. No suspicious lesion.   Cord: No abnormal signal.   Posterior Fossa, vertebral arteries, paraspinal tissues: Unremarkable.   Disc levels:   C2-C3:  No stenosis.   C3-C4: Trace central disc protrusion. No significant stenosis. Appearance is similar.   C4-C5: Small central disc protrusion. No significant stenosis. Appearance is similar.   C5-C6: Disc bulge with endplate osteophytes and superimposed new shallow left central protrusion. Uncovertebral hypertrophy. Mild to moderate canal stenosis. Mild foraminal stenosis.   C6-C7: Disc bulge with endplate osteophytes with superimposed shallow central extrusion extending below disc level. Uncovertebral hypertrophy. Moderate to marked canal and foraminal stenosis. Appearance is similar.   C7-T1:  Minimal disc bulge.  No significant stenosis.   IMPRESSION: Multilevel degenerative changes as detailed above are similar to the 2020 study. As before, canal and foraminal stenosis are greatest at C6-C7.     Electronically Signed   By: Guadlupe Spanish M.D.   On: 09/10/2023 20:59   Narrative & Impression  CLINICAL DATA:  new numbness, previous ACDF   EXAM: CERVICAL SPINE - COMPLETE 4+ VIEW   COMPARISON:  None Available.   FINDINGS: No fracture, dislocation or  subluxation. No spondylolisthesis. No osteolytic or osteoblastic changes. Prevertebral and cervical cranial soft tissues are unremarkable. No motion with flexion and extension to suggest instability.   Degenerative disc disease noted with disc space narrowing and marginal osteophytes at C6-7.   IMPRESSION: Degenerative changes. No acute osseous abnormalities.     Electronically Signed   By: Layla Maw M.D.   On: 08/22/2023 16:34     Her disc height is adequate at both levels for a cervical disc arthroplasty, she appears to have significant stenosis at C5-6 and C6-7, the C6-7 level is the most severe.  Will plan for decompression at this level initially followed by the 5 6 level intraoperatively.  I have personally reviewed the images and agree with the above interpretation.  Outside note review from/09/2023, physical therapy  CLINICAL IMPRESSION: Progress note today.  Good improvement with cervical mobility but noted weakness and decline in left hand grip strength.  Patient continues with significant pain levels although lessened.  Patient met 3/6 set rehab goals and is agreeable to discharge at this time.   Medical Decision Making/Assessment and Plan: [name] is a pleasant 48 y.o. female with progressive cervical myeloradiculopathy.  She has had worsened weakness in her left upper and lower extremity predominantly with significant weakness noted in her hands and wrists on the left worse than right.  She also has weakness in her left lower extremity as noted above.  She shows hyperreflexia and signs of cord compression on examination.  Her MRI demonstrates C5-6 and C6-7 stenosis causing anterior compression of her cervical spinal cord, with her neurologic examination and presentation consistent with cervical myelopathy with deficit.  She has less than 11 degrees of kyphosis.  She does smoke but states that she has cut down significantly.  We discussed two-level cervical disc  arthroplasty at C5-6 and C6-7 to decompress her spinal cord in the setting of progressive myelopathy with deficits.  She has participated in physical therapy and has been discharged.  We discussed risks and benefits of surgery not limited to but including weakness numbness tingling, failure to improve, need for further surgery, C5 palsy.  Thank you for involving me in the care of this patient.    Lovenia Kim MD/MSCR Neurosurgery   MDM high: Discussion of major surgery with identified risks, reviewed results and outside records/notes, personally interpreted imaging for specific factors regarding surgical decision making.

## 2023-10-19 NOTE — H&P (View-Only) (Signed)
 Referring Physician:  Rica Records, FNP [phone] [address] [place],  [place] [address]  Primary Physician:  Rica Records, FNP  History of Present Illness: 10/21/2023 Ms. Ramani Riva is here today with a chief complaint of cervical myeloradiculopathy.  She has been seen and discharged from physical therapy on/09/2023.  Continues to have bilateral neck and arm pain.  Pins and needle sensation in her bilateral hands and feet.  Her hands continue to weaken.  She also feels unsteady on her feet while walking.  She states that she does smoke but she has been cutting down  Past Surgery: no spinal surgeries  The symptoms are causing a significant impact on the patient's life.   I have utilized the care everywhere function in epic to review the outside records available from external health systems.  Review of Systems:  A 10 point review of systems is negative, except for the pertinent positives and negatives detailed in the HPI.  Past Medical History: Past Medical History:  Diagnosis Date   Anxiety    Phreesia 12/30/2019   Depression    GERD (gastroesophageal reflux disease)    Hypertension     Past Surgical History: Past Surgical History:  Procedure Laterality Date   BIOPSY  05/22/2022   Procedure: BIOPSY;  Surgeon: Dolores Frame, MD;  Location: [facility];  Service: Gastroenterology;;   BIOPSY  11/26/2022   Procedure: BIOPSY;  Surgeon: Dolores Frame, MD;  [facility]: [facility];  Service: Gastroenterology;;   CHOLECYSTECTOMY     COLONOSCOPY WITH PROPOFOL N/A 05/22/2022   Procedure: COLONOSCOPY WITH PROPOFOL;  Surgeon: Dolores Frame, MD;  [facility];  Service: Gastroenterology;  Laterality: N/A;  1215 ASA 2   ESOPHAGOGASTRODUODENOSCOPY (EGD) WITH PROPOFOL N/A 11/26/2022   Procedure: ESOPHAGOGASTRODUODENOSCOPY (EGD) WITH PROPOFOL;  Surgeon: Dolores Frame, MD;  [facility];   Service: Gastroenterology;  Laterality: N/A;  1:00PM;asa 2   POLYPECTOMY  05/22/2022   Procedure: POLYPECTOMY;  Surgeon: Dolores Frame, MD;  [facility];  Service: Gastroenterology;;   TUBAL LIGATION      Allergies: Allergies as of 10/21/2023 - Review Complete 10/21/2023  Allergen Reaction Noted   Other  09/13/2012    Medications:  Current Outpatient Medications:    albuterol (VENTOLIN HFA) 108 (90 Base) MCG/ACT inhaler, Inhale 2 puffs into the lungs every 6 (six) hours as needed for wheezing or shortness of breath., Disp: 8 g, Rfl: 2   amLODipine (NORVASC) 10 MG tablet, Take 1 tablet (10 mg total) by mouth daily., Disp: 90 tablet, Rfl: 1   Cholecalciferol (VITAMIN D) 125 MCG (5000 UT) CAPS, Take 5,000 Units by mouth daily., Disp: , Rfl:    cyclobenzaprine (FLEXERIL) 10 MG tablet, Take 1 tablet (10 mg total) by mouth 2 (two) times daily as needed for muscle spasms., Disp: 60 tablet, Rfl: 2   dicyclomine (BENTYL) 10 MG capsule, Take 1 capsule (10 mg total) by mouth every 12 (twelve) hours as needed for spasms (abdominal pain)., Disp: 180 capsule, Rfl: 1   escitalopram (LEXAPRO) 10 MG tablet, Take 1 tablet (10 mg total) by mouth daily., Disp: 30 tablet, Rfl: 3   gabapentin (NEURONTIN) 300 MG capsule, Take 2 capsules (600 mg total) by mouth 3 (three) times daily., Disp: 180 capsule, Rfl: 1   linaclotide (LINZESS) 145 MCG CAPS capsule, Take 1 capsule (145 mcg total) by mouth daily., Disp: 30 capsule, Rfl: 5   methylPREDNISolone (  MEDROL DOSEPAK) 4 MG TBPK tablet, Take by mouth daily, taper daily dose per package instructions., Disp: 21 tablet, Rfl: 0   metoprolol tartrate (LOPRESSOR) 50 MG tablet, Take 1 tablet by mouth twice daily, Disp: 90 tablet, Rfl: 0   Omega-3 Fatty Acids (FISH OIL) 1000 MG CAPS, Take 2 capsules (2,000 mg total) by mouth in the morning and at bedtime., Disp: 90 capsule, Rfl: 3   omeprazole (PRILOSEC) 40 MG capsule, Take 1 capsule by mouth once daily,  Disp: 60 capsule, Rfl: 0   ondansetron (ZOFRAN) 4 MG tablet, Take 1 tablet (4 mg total) by mouth every 8 (eight) hours as needed for nausea or vomiting., Disp: 180 tablet, Rfl: 1   oxybutynin (DITROPAN-XL) 10 MG 24 hr tablet, Take 2 tablets (20 mg total) by mouth at bedtime., Disp: 180 tablet, Rfl: 3   rosuvastatin (CRESTOR) 5 MG tablet, Take 1 tablet (5 mg total) by mouth daily., Disp: 90 tablet, Rfl: 3   vitamin B-12 (CYANOCOBALAMIN) 1000 MCG tablet, Take 1 tablet (1,000 mcg total) by mouth daily., Disp: 60 tablet, Rfl: 1  Social History: Social History   Tobacco Use   Smoking status: Every Day    Current packs/day: 1.50    Average packs/day: 1.5 packs/day for 34.0 years (51.0 ttl pk-yrs)    Types: Cigarettes    Passive exposure: Current   Smokeless tobacco: Never  Vaping Use   Vaping status: Never Used  Substance Use Topics   Alcohol use: Not Currently   Drug use: No    Family Medical History: Family History  Problem Relation Age of Onset   Hypertension Father    Hypercholesterolemia Father    COPD Mother    Emphysema Mother     Physical Examination: Vitals:   10/21/23 1412  BP: 122/82    General: Patient is in no apparent distress. Attention to examination is appropriate.  Neck:   Supple.  Somewhat limited range of motion, becomes symptomatic with excess extension or flexion.  Respiratory: Patient is breathing without any difficulty.   NEUROLOGICAL:     Awake, alert, oriented to person, place, and time.  Speech is clear and fluent.   Cranial Nerves: Pupils equal round and reactive to light.  Facial tone is symmetric.  Facial sensation is symmetric. Shoulder shrug is symmetric. Tongue protrusion is midline.    Strength:  Side Biceps Triceps Deltoid Interossei Grip Wrist Ext. Wrist Flex.  R 5 5 5 5 5 5 5   L 4+ 4+ 4+ 3 4-  4 4    Side Iliopsoas Quads Hamstring PF DF EHL  R 5 5 5 5 5 5   L 4 4+ 4 4+ 3 4-   Reflexes are hyperreflexic with spreading reflexes  bilaterally.  Hoffmann sign positive, Tromner sign positive.   She has decreased sensation in the bilateral upper and lower extremities both, with a distal predominance     Does appear slightly unsteady with walking.  Imaging: Narrative & Impression  CLINICAL DATA:  Cervical radiculopathy, no red flags   EXAM: MRI CERVICAL SPINE WITHOUT CONTRAST   TECHNIQUE: Multiplanar, multisequence MR imaging of the cervical spine was performed. No intravenous contrast was administered.   COMPARISON:  2020   FINDINGS: Alignment: Stable.   Vertebrae: Stable vertebral body heights. Degenerative endplate irregularity at C5-C6 and C6-C7. There is mild associated marrow edema. No suspicious lesion.   Cord: No abnormal signal.   Posterior Fossa, vertebral arteries, paraspinal tissues: Unremarkable.   Disc levels:   C2-C3:  No stenosis.   C3-C4: Trace central disc protrusion. No significant stenosis. Appearance is similar.   C4-C5: Small central disc protrusion. No significant stenosis. Appearance is similar.   C5-C6: Disc bulge with endplate osteophytes and superimposed new shallow left central protrusion. Uncovertebral hypertrophy. Mild to moderate canal stenosis. Mild foraminal stenosis.   C6-C7: Disc bulge with endplate osteophytes with superimposed shallow central extrusion extending below disc level. Uncovertebral hypertrophy. Moderate to marked canal and foraminal stenosis. Appearance is similar.   C7-T1:  Minimal disc bulge.  No significant stenosis.   IMPRESSION: Multilevel degenerative changes as detailed above are similar to the 2020 study. As before, canal and foraminal stenosis are greatest at C6-C7.     Electronically Signed   By: Guadlupe Spanish M.D.   On: 09/10/2023 20:59   Narrative & Impression  CLINICAL DATA:  new numbness, previous ACDF   EXAM: CERVICAL SPINE - COMPLETE 4+ VIEW   COMPARISON:  None Available.   FINDINGS: No fracture, dislocation or  subluxation. No spondylolisthesis. No osteolytic or osteoblastic changes. Prevertebral and cervical cranial soft tissues are unremarkable. No motion with flexion and extension to suggest instability.   Degenerative disc disease noted with disc space narrowing and marginal osteophytes at C6-7.   IMPRESSION: Degenerative changes. No acute osseous abnormalities.     Electronically Signed   By: Layla Maw M.D.   On: 08/22/2023 16:34     Her disc height is adequate at both levels for a cervical disc arthroplasty, she appears to have significant stenosis at C5-6 and C6-7, the C6-7 level is the most severe.  Will plan for decompression at this level initially followed by the 5 6 level intraoperatively.  I have personally reviewed the images and agree with the above interpretation.  Outside note review from/09/2023, physical therapy  CLINICAL IMPRESSION: Progress note today.  Good improvement with cervical mobility but noted weakness and decline in left hand grip strength.  Patient continues with significant pain levels although lessened.  Patient met 3/6 set rehab goals and is agreeable to discharge at this time.   Medical Decision Making/Assessment and Plan: Ms. Kilburg is a pleasant 48 y.o. female with progressive cervical myeloradiculopathy.  She has had worsened weakness in her left upper and lower extremity predominantly with significant weakness noted in her hands and wrists on the left worse than right.  She also has weakness in her left lower extremity as noted above.  She shows hyperreflexia and signs of cord compression on examination.  Her MRI demonstrates C5-6 and C6-7 stenosis causing anterior compression of her cervical spinal cord, with her neurologic examination and presentation consistent with cervical myelopathy with deficit.  She has less than 11 degrees of kyphosis.  She does smoke but states that she has cut down significantly.  We discussed two-level cervical disc  arthroplasty at C5-6 and C6-7 to decompress her spinal cord in the setting of progressive myelopathy with deficits.  She has participated in physical therapy and has been discharged.  We discussed risks and benefits of surgery not limited to but including weakness numbness tingling, failure to improve, need for further surgery, C5 palsy.  Thank you for involving me in the care of this patient.    Lovenia Kim MD/MSCR Neurosurgery   MDM high: Discussion of major surgery with identified risks, reviewed results and outside records/notes, personally interpreted imaging for specific factors regarding surgical decision making.

## 2023-10-20 ENCOUNTER — Telehealth (INDEPENDENT_AMBULATORY_CARE_PROVIDER_SITE_OTHER): Payer: Self-pay

## 2023-10-20 DIAGNOSIS — R112 Nausea with vomiting, unspecified: Secondary | ICD-10-CM

## 2023-10-20 NOTE — Telephone Encounter (Signed)
 Patient called back today wanting to proceed with the study that was preformed while she eats. The last office visit states consider GES for ongoing Nausea.    Per 09/08/2023 office visit. Please advise.  PLAN:  -increase amitiza to BID -continue zofran 4mg  PRN -continue bentyl 10mg  PRN -consider GES for ongoing nausea, pt to make me aware if she wants to proceed with this -Increase water intake, aim for atleast 64 oz per day -Increase fruits, veggies and whole grains, kiwi and prunes are especially good for constipation -consider anorectal manometry in the future if constipation persists   All questions were answered, patient verbalized understanding and is in agreement with plan as outlined above.      Follow Up: 6 months

## 2023-10-21 ENCOUNTER — Ambulatory Visit: Admitting: Neurosurgery

## 2023-10-21 ENCOUNTER — Other Ambulatory Visit (INDEPENDENT_AMBULATORY_CARE_PROVIDER_SITE_OTHER): Payer: Self-pay | Admitting: Gastroenterology

## 2023-10-21 ENCOUNTER — Other Ambulatory Visit: Payer: Self-pay

## 2023-10-21 VITALS — BP 122/82 | Ht 63.0 in | Wt 211.0 lb

## 2023-10-21 DIAGNOSIS — M4802 Spinal stenosis, cervical region: Secondary | ICD-10-CM

## 2023-10-21 DIAGNOSIS — R29898 Other symptoms and signs involving the musculoskeletal system: Secondary | ICD-10-CM | POA: Diagnosis not present

## 2023-10-21 DIAGNOSIS — M40202 Unspecified kyphosis, cervical region: Secondary | ICD-10-CM | POA: Diagnosis not present

## 2023-10-21 DIAGNOSIS — Z01818 Encounter for other preprocedural examination: Secondary | ICD-10-CM

## 2023-10-21 DIAGNOSIS — M5001 Cervical disc disorder with myelopathy,  high cervical region: Secondary | ICD-10-CM | POA: Diagnosis not present

## 2023-10-21 DIAGNOSIS — M5000 Cervical disc disorder with myelopathy, unspecified cervical region: Secondary | ICD-10-CM

## 2023-10-21 DIAGNOSIS — M479 Spondylosis, unspecified: Secondary | ICD-10-CM

## 2023-10-21 DIAGNOSIS — M542 Cervicalgia: Secondary | ICD-10-CM

## 2023-10-21 NOTE — Patient Instructions (Addendum)
 Please see below for information in regards to your upcoming surgery:  Planned surgery: C5-7 Cervical Disc Arthoplasty   Surgery date: 11/10/2023  at [facility] (Medical Mall: [address], [place], [place] [address]) - you will find out your arrival time the business day before your surgery.   Pre-op appointment at [facility] Pre-admit Testing: you will receive a call with a date/time for this appointment. If you are scheduled for an in person appointment, Pre-admit Testing is located on the first floor of the [facility] Arts building, [address] [facility] [address] [facility], [address]. During this appointment, they will advise you which medications you can take the morning of surgery, and which medications you will need to hold for surgery. Labs (such as blood work, EKG) may be done at your pre-op appointment. You are not required to fast for these labs. Should you need to change your pre-op appointment, please call Pre-admit testing at [phone].   Common restrictions after surgery: No bending, lifting, or twisting ("BLT"). Avoid lifting objects heavier than 10 pounds for the first 6 weeks after surgery. Where possible, avoid household activities that involve lifting, bending, reaching, pushing, or pulling such as laundry, vacuuming, grocery shopping, and childcare. Try to arrange for help from friends and family for these activities while you heal. Do not drive while taking prescription pain medication. Weeks 6 through 12 after surgery: avoid lifting more than 25 pounds.    X-rays after surgery: Because you are having a fusion or arthroplasty: for appointments after your 2 week follow-up: please arrive at the [facility] outpatient imaging center ([address], [address], [place]) or [facility] one hour prior to your appointment for x-rays. This applies to every appointment after your 2 week follow-up. Failure to do so may result in your appointment  being rescheduled.  How to contact us:  If you have any questions/concerns before or after surgery, you can reach [place] at [phone], or you can send a mychart message. We can be reached by phone or mychart 8am-4pm, Monday-Friday.  *Please note: Calls after 4pm are forwarded to a third party answering service. Mychart messages are not routinely monitored during evenings, weekends, and holidays. Please call our office to contact the answering service for urgent concerns during non-business hours.  Appointments/FMLA & disability paperwork: Joycelyn Rua, & Flonnie Hailstone Registered Nurses/Surgery schedulers: Ilsa Iha Medical Assistants: Nash Mantis Physician Assistants: Joan Flores, PA-C, Manning Charity, PA-C & Drake Leach, PA-C Surgeons: Venetia Night, MD & Ernestine Mcmurray, MD   [facility] PREADMIT TESTING VISIT and SURGERY INFORMATION SHEET   Now that surgery has been scheduled you can anticipate several phone calls from [facility] services. A pharmacy technician will call you to verify your current list of medications taken at home.               The Pre-Service Center will call to verify your insurance information and to give you billing estimates and information.             The Preadmit Testing Office will be calling to schedule a visit to obtain information for the anesthesia team and provide instructions on preparation for surgery.  What can you expect for the Preadmit Testing Visit: Appointments may be scheduled in-person or by telephone.  If a telephone visit is scheduled, you may be asked to come into the office to have lab tests or other studies performed.   This visit will not be completed any  greater than 14 days prior to your surgery.  If your surgery has been scheduled for a future date, please do not be alarmed if we have not contacted you to schedule an appointment more than a month prior to the surgery date.    Please be prepared to provide the  following information during this appointment:            -Personal medical history                                               -Medication and allergy list            -Any history of problems with anesthesia              -Recent lab work or diagnostic studies            -Please notify us of any needs we should be aware of to provide the best care possible           -You will be provided with instructions on how to prepare for your surgery.    On The Day of Surgery:  You must have a driver to take you home after surgery, you will be asked not to drive for 24 hours following surgery.  Taxi, Benedetto Goad and non-medical transport will not be acceptable means of transportation unless you have a responsible individual who will be traveling with you.  Visitors in the surgical area:   2 people will be able to visit you in your room once your preparation for surgery has been completed. During surgery, your visitors will be asked to wait in the Surgery Waiting Area.  It is not a requirement for them to stay, if they prefer to leave and come back.  Your visitor(s) will be given an update once the surgery has been completed.  No visitors are allowed in the initial recovery room to respect patient privacy and safety.  Once you are more awake and transfer to the secondary recovery area, or are transferred to an inpatient room, visitors will again be able to see you.  To respect and protect your privacy: We will ask on the day of surgery who your driver will be and what the contact number for that individual will be. We will ask if it is okay to share information with this individual, or if there is an alternative individual that we, or the surgeon, should contact to provide updates and information. If family or friends come to the surgical information desk requesting information about you, who you have not listed with [place], no information will be given.   It may be helpful to designate someone as the main contact  who will be responsible for updating your other friends and family.    PREADMIT TESTING OFFICE: [phone] SAME DAY SURGERY: [phone] We look forward to caring for you before and throughout the process of your surgery.

## 2023-10-21 NOTE — Telephone Encounter (Signed)
 Per [employer]-No authorization is required for this code unless this request is related to services over the maximum benefit. If this auth request is related to services over benefit limits, please submit your request directly to your health plan.  Attempted to reach [facility] to get pt scheduled but no answer; will try again later.

## 2023-10-22 NOTE — Telephone Encounter (Signed)
 Contacted [facility] and pt is scheduled for 11/11/23 at 8am. Left message to return call and also sent message via my chart.

## 2023-10-22 NOTE — Telephone Encounter (Signed)
 Pt left message returning call. Returned call to pt. Pt did see message on my chart but had to call to reschedule due to having neck surgery on 11/10/23. Pt now scheduled for 11/02/23.

## 2023-10-28 ENCOUNTER — Encounter
Admission: RE | Admit: 2023-10-28 | Discharge: 2023-10-28 | Disposition: A | Discharge status: Home / Self Care | Source: Ambulatory Visit | Attending: Neurosurgery | Admitting: Neurosurgery

## 2023-10-28 ENCOUNTER — Other Ambulatory Visit: Payer: Self-pay

## 2023-10-28 VITALS — BP 100/76 | HR 64 | Resp 14 | Ht 63.0 in | Wt 215.6 lb

## 2023-10-28 DIAGNOSIS — Z01812 Encounter for preprocedural laboratory examination: Secondary | ICD-10-CM

## 2023-10-28 DIAGNOSIS — Z01810 Encounter for preprocedural cardiovascular examination: Secondary | ICD-10-CM

## 2023-10-28 DIAGNOSIS — Z01818 Encounter for other preprocedural examination: Secondary | ICD-10-CM | POA: Insufficient documentation

## 2023-10-28 DIAGNOSIS — I10 Essential (primary) hypertension: Secondary | ICD-10-CM | POA: Diagnosis not present

## 2023-10-28 DIAGNOSIS — E785 Hyperlipidemia, unspecified: Secondary | ICD-10-CM | POA: Diagnosis not present

## 2023-10-28 HISTORY — DX: Tobacco use: Z72.0

## 2023-10-28 HISTORY — DX: Cervical disc disorder with myelopathy, unspecified cervical region: M50.00

## 2023-10-28 HISTORY — DX: Personal history of other diseases of the digestive system: Z87.19

## 2023-10-28 HISTORY — DX: Dyspnea, unspecified: R06.00

## 2023-10-28 HISTORY — DX: Vitamin D deficiency, unspecified: E55.9

## 2023-10-28 HISTORY — DX: Irritable bowel syndrome, unspecified: K58.9

## 2023-10-28 HISTORY — DX: Deficiency of other specified B group vitamins: E53.8

## 2023-10-28 HISTORY — DX: Hidradenitis suppurativa: L73.2

## 2023-10-28 HISTORY — DX: Overactive bladder: N32.81

## 2023-10-28 HISTORY — DX: Hyperlipidemia, unspecified: E78.5

## 2023-10-28 LAB — TYPE AND SCREEN
ABO/RH(D): O POS
Antibody Screen: NEGATIVE

## 2023-10-28 LAB — BASIC METABOLIC PANEL WITH GFR
Anion gap: 5 (ref 5–15)
BUN: 9 mg/dL (ref 6–20)
CO2: 25 mmol/L (ref 22–32)
Calcium: 9.2 mg/dL (ref 8.9–10.3)
Chloride: 107 mmol/L (ref 98–111)
Creatinine, Ser: 0.84 mg/dL (ref 0.44–1.00)
GFR, Estimated: >60 mL/min (ref >60)
Glucose, Bld: 70 mg/dL (ref 70–99)
Potassium: 4.1 mmol/L (ref 3.5–5.1)
Sodium: 137 mmol/L (ref 135–145)

## 2023-10-28 LAB — CBC
HCT: 44.8 % (ref 36.0–46.0)
Hemoglobin: 15.6 g/dL — ABNORMAL HIGH (ref 12.0–15.0)
MCH: 33.5 pg (ref 26.0–34.0)
MCHC: 34.8 g/dL (ref 30.0–36.0)
MCV: 96.1 fL (ref 80.0–100.0)
Platelets: 287 10*3/uL (ref 150–400)
RBC: 4.66 MIL/uL (ref 3.87–5.11)
RDW: 13.4 % (ref 11.5–15.5)
WBC: 10.5 10*3/uL (ref 4.0–10.5)
nRBC: 0 % (ref 0.0–0.2)

## 2023-10-28 LAB — SURGICAL PCR SCREEN
MRSA, PCR: NEGATIVE
Staphylococcus aureus: NEGATIVE

## 2023-10-28 NOTE — Patient Instructions (Addendum)
 Your procedure is scheduled on:11-10-23 Tuesday Report to the Registration Desk on the 1st floor of the Medical Mall.Then proceed to the 2nd floor Surgery Desk To find out your arrival time, please call [phone] between 1PM - 3PM on:11-09-23 Monday If your arrival time is 6:00 am, do not arrive before that time as the Medical Mall entrance doors do not open until 6:00 am.  REMEMBER: Instructions that are not followed completely may result in serious medical risk, up to and including death; or upon the discretion of your surgeon and anesthesiologist your surgery may need to be rescheduled.  Do not eat food after midnight the night before surgery.  No gum chewing or hard candies.  You may however, drink CLEAR liquids up to 2 hours before you are scheduled to arrive for your surgery. Do not drink anything within 2 hours of your scheduled arrival time.  Clear liquids include: - water  - apple juice without pulp - gatorade (not RED colors) - black coffee or tea (Do NOT add milk or creamers to the coffee or tea) Do NOT drink anything that is not on this list.  One week prior to surgery:Last dose will be on 11-02-23 Stop ANY OVER THE COUNTER supplements until after surgery (Vitamin D, Fish Oil, Vitamin B12)  Continue taking all of your other prescription medications up until the day of surgery.  ON THE DAY OF SURGERY ONLY TAKE THESE MEDICATIONS WITH SIPS OF WATER: -amLODipine (NORVASC)  -gabapentin (NEURONTIN)  -metoprolol tartrate (LOPRESSOR)  -omeprazole (PRILOSEC)  -rosuvastatin (CRESTOR)   Use your Albuterol Inhaler the day of surgery and bring your Inhaler to the hospital  No Alcohol for 24 hours before or after surgery.  No Smoking including e-cigarettes for 24 hours before surgery.  No chewable tobacco products for at least 6 hours before surgery.  No nicotine patches on the day of surgery.  Do not use any "recreational" drugs for at least a week (preferably 2 weeks) before  your surgery.  Please be advised that the combination of cocaine and anesthesia may have negative outcomes, up to and including death. If you test positive for cocaine, your surgery will be cancelled.  On the morning of surgery brush your teeth with toothpaste and water, you may rinse your mouth with mouthwash if you wish. Do not swallow any toothpaste or mouthwash.  Use CHG Soap as directed on instruction sheet.  Do not wear jewelry, make-up, hairpins, clips or nail polish.  For welded (permanent) jewelry: bracelets, anklets, waist bands, etc.  Please have this removed prior to surgery.  If it is not removed, there is a chance that hospital personnel will need to cut it off on the day of surgery.  Do not wear lotions, powders, or perfumes.   Do not shave body hair from the neck down 48 hours before surgery.  Contact lenses, hearing aids and dentures may not be worn into surgery.  Do not bring valuables to the hospital. [facility] is not responsible for any missing/lost belongings or valuables.   Notify your doctor if there is any change in your medical condition (cold, fever, infection).  Wear comfortable clothing (specific to your surgery type) to the hospital.  After surgery, you can help prevent lung complications by doing breathing exercises.  Take deep breaths and cough every 1-2 hours. Your doctor may order a device called an Incentive Spirometer to help you take deep breaths. When coughing or sneezing, hold a pillow firmly against your incision with both  hands. This is called "splinting." Doing this helps protect your incision. It also decreases belly discomfort.  If you are being admitted to the hospital overnight, leave your suitcase in the car. After surgery it may be brought to your room.  In case of increased patient census, it may be necessary for you, the patient, to continue your postoperative care in the Same Day Surgery department.  If you are being discharged the  day of surgery, you will not be allowed to drive home. You will need a responsible individual to drive you home and stay with you for 24 hours after surgery.   If you are taking public transportation, you will need to have a responsible individual with you.  Please call the Pre-admissions Testing Dept. at [phone] if you have any questions about these instructions.  Surgery Visitation Policy:  Patients having surgery or a procedure may have two visitors.  Children under the age of 58 must have an adult with them who is not the patient.  Inpatient Visitation:    Visiting hours are 7 a.m. to 8 p.m. Up to four visitors are allowed at one time in a patient room. The visitors may rotate out with other people during the day.  One visitor age 74 or older may stay with the patient overnight and must be in the room by 8 p.m.    Pre-operative 5 CHG Bath Instructions   You can play a key role in reducing the risk of infection after surgery. Your skin needs to be as free of germs as possible. You can reduce the number of germs on your skin by washing with CHG (chlorhexidine gluconate) soap before surgery. CHG is an antiseptic soap that kills germs and continues to kill germs even after washing.   DO NOT use if you have an allergy to chlorhexidine/CHG or antibacterial soaps. If your skin becomes reddened or irritated, stop using the CHG and notify one of our RNs at [phone].   Please shower with the CHG soap starting 4 days before surgery using the following schedule:     Please keep in mind the following:  DO NOT shave, including legs and underarms, starting the day of your first shower.   You may shave your face at any point before/day of surgery.  Place clean sheets on your bed the day you start using CHG soap. Use a clean washcloth (not used since being washed) for each shower. DO NOT sleep with pets once you start using the CHG.   CHG Shower Instructions:  If you choose to  wash your hair and private area, wash first with your normal shampoo/soap.  After you use shampoo/soap, rinse your hair and body thoroughly to remove shampoo/soap residue.  Turn the water OFF and apply about 3 tablespoons (45 ml) of CHG soap to a CLEAN washcloth.  Apply CHG soap ONLY FROM YOUR NECK DOWN TO YOUR TOES (washing for 3-5 minutes)  DO NOT use CHG soap on face, private areas, open wounds, or sores.  Pay special attention to the area where your surgery is being performed.  If you are having back surgery, having someone wash your back for you may be helpful. Wait 2 minutes after CHG soap is applied, then you may rinse off the CHG soap.  Pat dry with a clean towel  Put on clean clothes/pajamas   If you choose to wear lotion, please use ONLY the CHG-compatible lotions on the back of this paper.     Additional  instructions for the day of surgery: DO NOT APPLY any lotions, deodorants, cologne, or perfumes.   Put on clean/comfortable clothes.  Brush your teeth.  Ask your nurse before applying any prescription medications to the skin.      CHG Compatible Lotions   Aveeno Moisturizing lotion  Cetaphil Moisturizing Cream  Cetaphil Moisturizing Lotion  Clairol Herbal Essence Moisturizing Lotion, Dry Skin  Clairol Herbal Essence Moisturizing Lotion, Extra Dry Skin  Clairol Herbal Essence Moisturizing Lotion, Normal Skin  Curel Age Defying Therapeutic Moisturizing Lotion with Alpha Hydroxy  Curel Extreme Care Body Lotion  Curel Soothing Hands Moisturizing Hand Lotion  Curel Therapeutic Moisturizing Cream, Fragrance-Free  Curel Therapeutic Moisturizing Lotion, Fragrance-Free  Curel Therapeutic Moisturizing Lotion, Original Formula  Eucerin Daily Replenishing Lotion  Eucerin Dry Skin Therapy Plus Alpha Hydroxy Crme  Eucerin Dry Skin Therapy Plus Alpha Hydroxy Lotion  Eucerin Original Crme  Eucerin Original Lotion  Eucerin Plus Crme Eucerin Plus Lotion  Eucerin TriLipid  Replenishing Lotion  Keri Anti-Bacterial Hand Lotion  Keri Deep Conditioning Original Lotion Dry Skin Formula Softly Scented  Keri Deep Conditioning Original Lotion, Fragrance Free Sensitive Skin Formula  Keri Lotion Fast Absorbing Fragrance Free Sensitive Skin Formula  Keri Lotion Fast Absorbing Softly Scented Dry Skin Formula  Keri Original Lotion  Keri Skin Renewal Lotion Keri Silky Smooth Lotion  Keri Silky Smooth Sensitive Skin Lotion  Nivea Body Creamy Conditioning Oil  Nivea Body Extra Enriched Teacher, adult education Moisturizing Lotion Nivea Crme  Nivea Skin Firming Lotion  NutraDerm 30 Skin Lotion  NutraDerm Skin Lotion  NutraDerm Therapeutic Skin Cream  NutraDerm Therapeutic Skin Lotion  ProShield Protective Hand Cream  Provon moisturizing lotion

## 2023-10-28 NOTE — Addendum Note (Signed)
 Addended by: Jerzie Bieri on: 10/28/2023 09:00 AM   Modules accepted: Orders

## 2023-11-02 ENCOUNTER — Encounter (HOSPITAL_COMMUNITY): Admission: RE | Admit: 2023-11-02 | Source: Ambulatory Visit

## 2023-11-04 ENCOUNTER — Encounter (HOSPITAL_COMMUNITY)
Admission: RE | Admit: 2023-11-04 | Discharge: 2023-11-04 | Disposition: A | Discharge status: Home / Self Care | Source: Ambulatory Visit | Attending: Gastroenterology | Admitting: Gastroenterology

## 2023-11-04 ENCOUNTER — Encounter (HOSPITAL_COMMUNITY): Payer: Self-pay

## 2023-11-04 ENCOUNTER — Encounter (INDEPENDENT_AMBULATORY_CARE_PROVIDER_SITE_OTHER): Payer: Self-pay

## 2023-11-04 DIAGNOSIS — R112 Nausea with vomiting, unspecified: Secondary | ICD-10-CM | POA: Insufficient documentation

## 2023-11-04 DIAGNOSIS — R140 Abdominal distension (gaseous): Secondary | ICD-10-CM | POA: Diagnosis not present

## 2023-11-04 DIAGNOSIS — R6881 Early satiety: Secondary | ICD-10-CM | POA: Diagnosis not present

## 2023-11-04 DIAGNOSIS — K219 Gastro-esophageal reflux disease without esophagitis: Secondary | ICD-10-CM | POA: Diagnosis not present

## 2023-11-04 HISTORY — DX: Unspecified asthma, uncomplicated: J45.909

## 2023-11-04 MED ORDER — TECHNETIUM TC 99M SULFUR COLLOID
2.0000 | Freq: Once | INTRAVENOUS | Status: AC | PRN
  Administered 2023-11-04: 2 via ORAL

## 2023-11-10 ENCOUNTER — Ambulatory Visit: Payer: Self-pay | Admitting: Certified Registered"

## 2023-11-10 ENCOUNTER — Ambulatory Visit
Admission: RE | Admit: 2023-11-10 | Discharge: 2023-11-11 | Disposition: A | Discharge status: Home / Self Care | Attending: Neurosurgery | Admitting: Neurosurgery

## 2023-11-10 ENCOUNTER — Other Ambulatory Visit: Payer: Self-pay

## 2023-11-10 ENCOUNTER — Ambulatory Visit: Payer: Self-pay | Admitting: Urgent Care

## 2023-11-10 ENCOUNTER — Encounter
Admission: RE | Disposition: A | Discharge status: Home / Self Care | Payer: Self-pay | Source: Home / Self Care | Attending: Neurosurgery

## 2023-11-10 ENCOUNTER — Encounter: Payer: Self-pay | Admitting: Neurosurgery

## 2023-11-10 ENCOUNTER — Ambulatory Visit

## 2023-11-10 DIAGNOSIS — I10 Essential (primary) hypertension: Secondary | ICD-10-CM | POA: Insufficient documentation

## 2023-11-10 DIAGNOSIS — J449 Chronic obstructive pulmonary disease, unspecified: Secondary | ICD-10-CM | POA: Insufficient documentation

## 2023-11-10 DIAGNOSIS — M50022 Cervical disc disorder at C5-C6 level with myelopathy: Secondary | ICD-10-CM

## 2023-11-10 DIAGNOSIS — M50023 Cervical disc disorder at C6-C7 level with myelopathy: Secondary | ICD-10-CM

## 2023-11-10 DIAGNOSIS — F17200 Nicotine dependence, unspecified, uncomplicated: Secondary | ICD-10-CM | POA: Diagnosis not present

## 2023-11-10 DIAGNOSIS — K219 Gastro-esophageal reflux disease without esophagitis: Secondary | ICD-10-CM | POA: Diagnosis not present

## 2023-11-10 DIAGNOSIS — R29898 Other symptoms and signs involving the musculoskeletal system: Secondary | ICD-10-CM | POA: Diagnosis not present

## 2023-11-10 DIAGNOSIS — M5000 Cervical disc disorder with myelopathy, unspecified cervical region: Secondary | ICD-10-CM | POA: Diagnosis not present

## 2023-11-10 DIAGNOSIS — M5412 Radiculopathy, cervical region: Secondary | ICD-10-CM | POA: Diagnosis present

## 2023-11-10 DIAGNOSIS — M4802 Spinal stenosis, cervical region: Secondary | ICD-10-CM | POA: Diagnosis not present

## 2023-11-10 DIAGNOSIS — F17210 Nicotine dependence, cigarettes, uncomplicated: Secondary | ICD-10-CM | POA: Diagnosis not present

## 2023-11-10 DIAGNOSIS — Z0189 Encounter for other specified special examinations: Secondary | ICD-10-CM | POA: Diagnosis not present

## 2023-11-10 DIAGNOSIS — E785 Hyperlipidemia, unspecified: Secondary | ICD-10-CM

## 2023-11-10 DIAGNOSIS — Z01810 Encounter for preprocedural cardiovascular examination: Secondary | ICD-10-CM

## 2023-11-10 DIAGNOSIS — Z01818 Encounter for other preprocedural examination: Secondary | ICD-10-CM

## 2023-11-10 DIAGNOSIS — M542 Cervicalgia: Secondary | ICD-10-CM | POA: Diagnosis present

## 2023-11-10 DIAGNOSIS — Z01812 Encounter for preprocedural laboratory examination: Secondary | ICD-10-CM

## 2023-11-10 HISTORY — PX: CERVICAL DISC ARTHROPLASTY: SHX587

## 2023-11-10 HISTORY — PX: CERVICAL ANTERIOR DISC ARTHROPLASTY, 2 LEVEL: SHX7538

## 2023-11-10 LAB — ABO/RH: ABO/RH(D): O POS

## 2023-11-10 LAB — POCT PREGNANCY, URINE: Preg Test, Ur: NEGATIVE

## 2023-11-10 SURGERY — CERVICAL ANTERIOR DISC ARTHROPLASTY
Anesthesia: General | Site: Spine Cervical

## 2023-11-10 MED ORDER — BISACODYL 5 MG PO TBEC: 5.0000 mg | DELAYED_RELEASE_TABLET | Freq: Every day | ORAL | Status: DC | PRN

## 2023-11-10 MED ORDER — ALUM & MAG HYDROXIDE-SIMETH 200-200-20 MG/5ML PO SUSP: 30.0000 mL | Freq: Four times a day (QID) | ORAL | Status: DC | PRN

## 2023-11-10 MED ORDER — PROPOFOL 1000 MG/100ML IV EMUL
INTRAVENOUS | Status: AC
  Filled 2023-11-10: qty 100

## 2023-11-10 MED ORDER — OXYCODONE HCL 5 MG PO TABS: 10.0000 mg | ORAL_TABLET | ORAL | Status: DC | PRN

## 2023-11-10 MED ORDER — CEFAZOLIN SODIUM-DEXTROSE 2-4 GM/100ML-% IV SOLN
2.0000 g | Freq: Four times a day (QID) | INTRAVENOUS | Status: AC
  Administered 2023-11-10 (×2): 2 g via INTRAVENOUS
  Filled 2023-11-10: qty 100

## 2023-11-10 MED ORDER — BUPIVACAINE-EPINEPHRINE (PF) 0.5% -1:200000 IJ SOLN
INTRAMUSCULAR | Status: AC
  Filled 2023-11-10: qty 10

## 2023-11-10 MED ORDER — FENTANYL CITRATE (PF) 100 MCG/2ML IJ SOLN
INTRAMUSCULAR | Status: DC | PRN
  Administered 2023-11-10 (×2): 50 ug via INTRAVENOUS

## 2023-11-10 MED ORDER — CHLORHEXIDINE GLUCONATE 0.12 % MT SOLN
OROMUCOSAL | Status: AC
  Filled 2023-11-10: qty 15

## 2023-11-10 MED ORDER — SUCCINYLCHOLINE CHLORIDE 200 MG/10ML IV SOSY
PREFILLED_SYRINGE | INTRAVENOUS | Status: AC
  Filled 2023-11-10: qty 10

## 2023-11-10 MED ORDER — CELECOXIB 200 MG PO CAPS
ORAL_CAPSULE | ORAL | Status: AC
  Filled 2023-11-10: qty 1

## 2023-11-10 MED ORDER — MIDAZOLAM HCL 2 MG/2ML IJ SOLN
INTRAMUSCULAR | Status: AC
  Filled 2023-11-10: qty 2

## 2023-11-10 MED ORDER — CHLORHEXIDINE GLUCONATE 0.12 % MT SOLN
15.0000 mL | Freq: Once | OROMUCOSAL | Status: AC
  Administered 2023-11-10: 15 mL via OROMUCOSAL

## 2023-11-10 MED ORDER — BUPIVACAINE-EPINEPHRINE (PF) 0.5% -1:200000 IJ SOLN
INTRAMUSCULAR | Status: DC | PRN
  Administered 2023-11-10: 8 mL

## 2023-11-10 MED ORDER — GABAPENTIN 400 MG PO CAPS
600.0000 mg | ORAL_CAPSULE | Freq: Three times a day (TID) | ORAL | Status: DC
  Administered 2023-11-10 – 2023-11-11 (×3): 600 mg via ORAL
  Filled 2023-11-10 (×3): qty 2

## 2023-11-10 MED ORDER — DEXMEDETOMIDINE HCL IN NACL 80 MCG/20ML IV SOLN
INTRAVENOUS | Status: AC
  Filled 2023-11-10: qty 20

## 2023-11-10 MED ORDER — ENOXAPARIN SODIUM 40 MG/0.4ML IJ SOSY
40.0000 mg | PREFILLED_SYRINGE | INTRAMUSCULAR | Status: DC
  Administered 2023-11-11: 40 mg via SUBCUTANEOUS
  Filled 2023-11-10: qty 0.4

## 2023-11-10 MED ORDER — FENTANYL CITRATE (PF) 100 MCG/2ML IJ SOLN
INTRAMUSCULAR | Status: AC
  Filled 2023-11-10: qty 2

## 2023-11-10 MED ORDER — PROPOFOL 1000 MG/100ML IV EMUL
INTRAVENOUS | Status: AC
  Filled 2023-11-10: qty 200

## 2023-11-10 MED ORDER — CELECOXIB 200 MG PO CAPS
200.0000 mg | ORAL_CAPSULE | Freq: Two times a day (BID) | ORAL | Status: DC
  Administered 2023-11-10 – 2023-11-11 (×3): 200 mg via ORAL
  Filled 2023-11-10 (×2): qty 1

## 2023-11-10 MED ORDER — LINACLOTIDE 145 MCG PO CAPS
145.0000 ug | ORAL_CAPSULE | Freq: Every morning | ORAL | Status: DC
  Administered 2023-11-11: 145 ug via ORAL
  Filled 2023-11-10: qty 1

## 2023-11-10 MED ORDER — SODIUM CHLORIDE 0.9% FLUSH
3.0000 mL | Freq: Two times a day (BID) | INTRAVENOUS | Status: DC
  Administered 2023-11-10 – 2023-11-11 (×2): 3 mL via INTRAVENOUS

## 2023-11-10 MED ORDER — IPRATROPIUM-ALBUTEROL 0.5-2.5 (3) MG/3ML IN SOLN
3.0000 mL | RESPIRATORY_TRACT | Status: DC
  Administered 2023-11-10: 3 mL via RESPIRATORY_TRACT

## 2023-11-10 MED ORDER — ROSUVASTATIN CALCIUM 5 MG PO TABS
5.0000 mg | ORAL_TABLET | ORAL | Status: DC
Start: 2023-11-11 — End: 2023-11-11
  Administered 2023-11-11: 5 mg via ORAL
  Filled 2023-11-10: qty 1

## 2023-11-10 MED ORDER — SURGIFLO WITH THROMBIN (HEMOSTATIC MATRIX KIT) OPTIME
TOPICAL | Status: DC | PRN
  Administered 2023-11-10: 1 via TOPICAL

## 2023-11-10 MED ORDER — DEXAMETHASONE SODIUM PHOSPHATE 10 MG/ML IJ SOLN
INTRAMUSCULAR | Status: AC
  Filled 2023-11-10: qty 1

## 2023-11-10 MED ORDER — PANTOPRAZOLE SODIUM 40 MG IV SOLR
40.0000 mg | Freq: Every day | INTRAVENOUS | Status: DC
  Administered 2023-11-10: 40 mg via INTRAVENOUS
  Filled 2023-11-10: qty 10

## 2023-11-10 MED ORDER — SODIUM CHLORIDE 0.9 % IV SOLN: 250.0000 mL | INTRAVENOUS | Status: DC

## 2023-11-10 MED ORDER — SUCCINYLCHOLINE CHLORIDE 200 MG/10ML IV SOSY
PREFILLED_SYRINGE | INTRAVENOUS | Status: DC | PRN
  Administered 2023-11-10: 100 mg via INTRAVENOUS

## 2023-11-10 MED ORDER — MIDAZOLAM HCL 2 MG/2ML IJ SOLN
INTRAMUSCULAR | Status: DC | PRN
  Administered 2023-11-10: 2 mg via INTRAVENOUS

## 2023-11-10 MED ORDER — HYDROMORPHONE HCL 1 MG/ML IJ SOLN: 1.0000 mg | INTRAMUSCULAR | Status: DC | PRN

## 2023-11-10 MED ORDER — OXYBUTYNIN CHLORIDE ER 5 MG PO TB24
20.0000 mg | ORAL_TABLET | Freq: Every day | ORAL | Status: DC
  Administered 2023-11-10: 20 mg via ORAL
  Filled 2023-11-10: qty 4

## 2023-11-10 MED ORDER — ACETAMINOPHEN 650 MG RE SUPP: 650.0000 mg | RECTAL | Status: DC | PRN

## 2023-11-10 MED ORDER — ONDANSETRON HCL 4 MG PO TABS: 4.0000 mg | ORAL_TABLET | Freq: Four times a day (QID) | ORAL | Status: DC | PRN

## 2023-11-10 MED ORDER — 0.9 % SODIUM CHLORIDE (POUR BTL) OPTIME
TOPICAL | Status: DC | PRN
  Administered 2023-11-10: 500 mL

## 2023-11-10 MED ORDER — DICYCLOMINE HCL 10 MG PO CAPS: 10.0000 mg | ORAL_CAPSULE | Freq: Two times a day (BID) | ORAL | Status: DC | PRN

## 2023-11-10 MED ORDER — PROPOFOL 10 MG/ML IV BOLUS
INTRAVENOUS | Status: AC
  Filled 2023-11-10: qty 20

## 2023-11-10 MED ORDER — METHOCARBAMOL 500 MG PO TABS: 500.0000 mg | ORAL_TABLET | Freq: Once | ORAL | Status: DC

## 2023-11-10 MED ORDER — KETAMINE HCL 50 MG/5ML IJ SOSY
PREFILLED_SYRINGE | INTRAMUSCULAR | Status: DC | PRN
  Administered 2023-11-10: 30 mg via INTRAVENOUS
  Administered 2023-11-10: 20 mg via INTRAVENOUS

## 2023-11-10 MED ORDER — ACETAMINOPHEN 10 MG/ML IV SOLN
INTRAVENOUS | Status: DC | PRN
  Administered 2023-11-10: 1000 mg via INTRAVENOUS

## 2023-11-10 MED ORDER — PROPOFOL 10 MG/ML IV BOLUS
INTRAVENOUS | Status: DC | PRN
  Administered 2023-11-10: 50 mg via INTRAVENOUS
  Administered 2023-11-10: 125 ug/kg/min via INTRAVENOUS
  Administered 2023-11-10: 150 mg via INTRAVENOUS

## 2023-11-10 MED ORDER — ACETAMINOPHEN 325 MG PO TABS: 650.0000 mg | ORAL_TABLET | ORAL | Status: DC | PRN

## 2023-11-10 MED ORDER — METHOCARBAMOL 500 MG PO TABS
ORAL_TABLET | ORAL | Status: AC
  Filled 2023-11-10: qty 1

## 2023-11-10 MED ORDER — ONDANSETRON HCL 4 MG/2ML IJ SOLN
INTRAMUSCULAR | Status: DC | PRN
  Administered 2023-11-10: 4 mg via INTRAVENOUS

## 2023-11-10 MED ORDER — PHENYLEPHRINE HCL-NACL 20-0.9 MG/250ML-% IV SOLN
INTRAVENOUS | Status: DC | PRN
  Administered 2023-11-10: 40 ug/min via INTRAVENOUS

## 2023-11-10 MED ORDER — ORAL CARE MOUTH RINSE: 15.0000 mL | Freq: Once | OROMUCOSAL | Status: AC

## 2023-11-10 MED ORDER — CYCLOBENZAPRINE HCL 10 MG PO TABS: 10.0000 mg | ORAL_TABLET | Freq: Three times a day (TID) | ORAL | Status: DC | PRN

## 2023-11-10 MED ORDER — CEFAZOLIN SODIUM-DEXTROSE 2-4 GM/100ML-% IV SOLN
INTRAVENOUS | Status: AC
  Filled 2023-11-10: qty 100

## 2023-11-10 MED ORDER — PHENYLEPHRINE HCL-NACL 20-0.9 MG/250ML-% IV SOLN
INTRAVENOUS | Status: AC
  Filled 2023-11-10: qty 250

## 2023-11-10 MED ORDER — PANTOPRAZOLE SODIUM 40 MG PO TBEC: 80.0000 mg | DELAYED_RELEASE_TABLET | Freq: Every day | ORAL | Status: DC

## 2023-11-10 MED ORDER — ACETAMINOPHEN 500 MG PO TABS
1000.0000 mg | ORAL_TABLET | Freq: Four times a day (QID) | ORAL | Status: DC
  Administered 2023-11-10 – 2023-11-11 (×3): 1000 mg via ORAL
  Filled 2023-11-10 (×3): qty 2

## 2023-11-10 MED ORDER — DOCUSATE SODIUM 100 MG PO CAPS
100.0000 mg | ORAL_CAPSULE | Freq: Two times a day (BID) | ORAL | Status: DC
  Administered 2023-11-10: 100 mg via ORAL
  Filled 2023-11-10 (×2): qty 1

## 2023-11-10 MED ORDER — METOPROLOL TARTRATE 50 MG PO TABS
50.0000 mg | ORAL_TABLET | Freq: Two times a day (BID) | ORAL | Status: DC
  Administered 2023-11-10: 50 mg via ORAL
  Filled 2023-11-10 (×2): qty 1

## 2023-11-10 MED ORDER — LIDOCAINE HCL (CARDIAC) PF 100 MG/5ML IV SOSY
PREFILLED_SYRINGE | INTRAVENOUS | Status: DC | PRN
  Administered 2023-11-10: 20 mg via INTRAVENOUS
  Administered 2023-11-10: 150 mg via INTRAVENOUS
  Administered 2023-11-10: 80 mg via INTRAVENOUS

## 2023-11-10 MED ORDER — OXYCODONE HCL 5 MG PO TABS
5.0000 mg | ORAL_TABLET | Freq: Once | ORAL | Status: AC | PRN
  Administered 2023-11-10: 5 mg via ORAL

## 2023-11-10 MED ORDER — VITAMIN B-12 1000 MCG PO TABS
1000.0000 ug | ORAL_TABLET | Freq: Every day | ORAL | Status: DC
  Administered 2023-11-11: 1000 ug via ORAL
  Filled 2023-11-10: qty 1

## 2023-11-10 MED ORDER — MAGNESIUM CITRATE PO SOLN: 1.0000 | Freq: Once | ORAL | Status: DC | PRN

## 2023-11-10 MED ORDER — VITAMIN D3 25 MCG (1000 UNIT) PO TABS
1000.0000 [IU] | ORAL_TABLET | Freq: Every day | ORAL | Status: DC
  Administered 2023-11-11: 1000 [IU] via ORAL
  Filled 2023-11-10 (×2): qty 1

## 2023-11-10 MED ORDER — SODIUM CHLORIDE (PF) 0.9 % IJ SOLN
INTRAMUSCULAR | Status: AC
  Filled 2023-11-10: qty 40

## 2023-11-10 MED ORDER — OXYCODONE HCL 5 MG PO TABS
ORAL_TABLET | ORAL | Status: AC
  Filled 2023-11-10: qty 1

## 2023-11-10 MED ORDER — DEXAMETHASONE SODIUM PHOSPHATE 10 MG/ML IJ SOLN
INTRAMUSCULAR | Status: DC | PRN
  Administered 2023-11-10: 10 mg via INTRAVENOUS

## 2023-11-10 MED ORDER — ALBUTEROL SULFATE (2.5 MG/3ML) 0.083% IN NEBU
2.5000 mg | INHALATION_SOLUTION | Freq: Four times a day (QID) | RESPIRATORY_TRACT | Status: DC | PRN
  Administered 2023-11-11: 2.5 mg via RESPIRATORY_TRACT
  Filled 2023-11-10: qty 3

## 2023-11-10 MED ORDER — ALBUTEROL SULFATE HFA 108 (90 BASE) MCG/ACT IN AERS
INHALATION_SPRAY | RESPIRATORY_TRACT | Status: DC | PRN
Start: 2023-11-10 — End: 2023-11-10
  Administered 2023-11-10: 4 via RESPIRATORY_TRACT

## 2023-11-10 MED ORDER — SENNA 8.6 MG PO TABS
1.0000 | ORAL_TABLET | Freq: Two times a day (BID) | ORAL | Status: DC
  Administered 2023-11-10 – 2023-11-11 (×2): 8.6 mg via ORAL
  Filled 2023-11-10 (×2): qty 1

## 2023-11-10 MED ORDER — CEFAZOLIN IN SODIUM CHLORIDE 2-0.9 GM/100ML-% IV SOLN
2.0000 g | Freq: Once | INTRAVENOUS | Status: AC
  Administered 2023-11-10: 2 g via INTRAVENOUS
  Filled 2023-11-10: qty 100

## 2023-11-10 MED ORDER — SODIUM CHLORIDE 0.9 % IV SOLN
INTRAVENOUS | Status: AC
  Filled 2023-11-10: qty 2

## 2023-11-10 MED ORDER — REMIFENTANIL HCL 1 MG IV SOLR
INTRAVENOUS | Status: AC
  Filled 2023-11-10: qty 1000

## 2023-11-10 MED ORDER — FENTANYL CITRATE (PF) 100 MCG/2ML IJ SOLN
25.0000 ug | INTRAMUSCULAR | Status: DC | PRN
  Administered 2023-11-10 (×4): 25 ug via INTRAVENOUS

## 2023-11-10 MED ORDER — KETAMINE HCL 50 MG/5ML IJ SOSY
PREFILLED_SYRINGE | INTRAMUSCULAR | Status: AC
  Filled 2023-11-10: qty 5

## 2023-11-10 MED ORDER — REMIFENTANIL HCL 1 MG IV SOLR
INTRAVENOUS | Status: AC
Start: 2023-11-10 — End: ?
  Filled 2023-11-10: qty 1000

## 2023-11-10 MED ORDER — AMLODIPINE BESYLATE 10 MG PO TABS
10.0000 mg | ORAL_TABLET | Freq: Every day | ORAL | Status: DC
  Filled 2023-11-10: qty 1

## 2023-11-10 MED ORDER — OXYCODONE HCL 5 MG PO TABS
5.0000 mg | ORAL_TABLET | ORAL | Status: DC | PRN
  Administered 2023-11-10 – 2023-11-11 (×3): 5 mg via ORAL
  Filled 2023-11-10 (×3): qty 1

## 2023-11-10 MED ORDER — ONDANSETRON HCL 4 MG/2ML IJ SOLN
INTRAMUSCULAR | Status: AC
  Filled 2023-11-10: qty 2

## 2023-11-10 MED ORDER — OXYCODONE HCL 5 MG/5ML PO SOLN: 5.0000 mg | Freq: Once | ORAL | Status: AC | PRN

## 2023-11-10 MED ORDER — IPRATROPIUM-ALBUTEROL 0.5-2.5 (3) MG/3ML IN SOLN
RESPIRATORY_TRACT | Status: AC
  Filled 2023-11-10: qty 3

## 2023-11-10 MED ORDER — SENNOSIDES-DOCUSATE SODIUM 8.6-50 MG PO TABS: 1.0000 | ORAL_TABLET | Freq: Every evening | ORAL | Status: DC | PRN

## 2023-11-10 MED ORDER — ROCURONIUM BROMIDE 10 MG/ML (PF) SYRINGE
PREFILLED_SYRINGE | INTRAVENOUS | Status: AC
  Filled 2023-11-10: qty 10

## 2023-11-10 MED ORDER — DEXMEDETOMIDINE HCL IN NACL 80 MCG/20ML IV SOLN
INTRAVENOUS | Status: DC | PRN
  Administered 2023-11-10: 4 ug via INTRAVENOUS
  Administered 2023-11-10 (×3): 8 ug via INTRAVENOUS

## 2023-11-10 MED ORDER — ESCITALOPRAM OXALATE 10 MG PO TABS
10.0000 mg | ORAL_TABLET | Freq: Every day | ORAL | Status: DC
  Administered 2023-11-10: 10 mg via ORAL
  Filled 2023-11-10 (×2): qty 1

## 2023-11-10 MED ORDER — REMIFENTANIL HCL 1 MG IV SOLR
INTRAVENOUS | Status: DC | PRN
  Administered 2023-11-10: .2 ug/kg/min via INTRAVENOUS
  Administered 2023-11-10: .15 ug/kg/min via INTRAVENOUS

## 2023-11-10 MED ORDER — ONDANSETRON HCL 4 MG/2ML IJ SOLN: 4.0000 mg | Freq: Four times a day (QID) | INTRAMUSCULAR | Status: DC | PRN

## 2023-11-10 MED ORDER — PHENYLEPHRINE 80 MCG/ML (10ML) SYRINGE FOR IV PUSH (FOR BLOOD PRESSURE SUPPORT)
PREFILLED_SYRINGE | INTRAVENOUS | Status: AC
  Filled 2023-11-10: qty 10

## 2023-11-10 MED ORDER — ACETAMINOPHEN 10 MG/ML IV SOLN
INTRAVENOUS | Status: AC
  Filled 2023-11-10: qty 100

## 2023-11-10 MED ORDER — LACTATED RINGERS IV SOLN: INTRAVENOUS | Status: DC | PRN

## 2023-11-10 MED ORDER — PHENOL 1.4 % MT LIQD: 1.0000 | OROMUCOSAL | Status: DC | PRN

## 2023-11-10 MED ORDER — MENTHOL 3 MG MT LOZG
1.0000 | LOZENGE | OROMUCOSAL | Status: DC | PRN
  Administered 2023-11-11: 3 mg via ORAL
  Filled 2023-11-10: qty 9

## 2023-11-10 MED ORDER — SODIUM CHLORIDE 0.9% FLUSH: 3.0000 mL | INTRAVENOUS | Status: DC | PRN

## 2023-11-10 MED ORDER — PHENYLEPHRINE 80 MCG/ML (10ML) SYRINGE FOR IV PUSH (FOR BLOOD PRESSURE SUPPORT)
PREFILLED_SYRINGE | INTRAVENOUS | Status: DC | PRN
  Administered 2023-11-10 (×2): 80 ug via INTRAVENOUS

## 2023-11-10 MED ORDER — LACTATED RINGERS IV SOLN: INTRAVENOUS | Status: DC

## 2023-11-10 MED ORDER — LIDOCAINE HCL (PF) 2 % IJ SOLN
INTRAMUSCULAR | Status: AC
  Filled 2023-11-10: qty 5

## 2023-11-10 SURGICAL SUPPLY — 59 items
BASIN KIT SINGLE STR (MISCELLANEOUS) ×3 IMPLANT
BLADE BOVIE TIP EXT 4 (BLADE) ×1 IMPLANT
BRUSH SCRUB EZ 4% CHG (MISCELLANEOUS) ×3 IMPLANT
BUR NEURO DRILL SOFT 3.0X3.8M (BURR) ×5 IMPLANT
CHLORAPREP W/TINT 26 (MISCELLANEOUS) ×4 IMPLANT
COUNTER NDL MAGNETIC 40 RED (SET/KITS/TRAYS/PACK) ×2 IMPLANT
COUNTER NEEDLE MAGNETIC 40 RED (SET/KITS/TRAYS/PACK) IMPLANT
DERMABOND ADVANCED .7 DNX12 (GAUZE/BANDAGES/DRESSINGS) ×5 IMPLANT
DISC MOBI-C CERVICAL 15X7X15 (Neuro Prosthesis/Implant) ×2 IMPLANT
DRAIN CHANNEL JP 10F RND 20C F (MISCELLANEOUS) IMPLANT
DRAPE C ARM PK CFD 31 SPINE (DRAPES) ×2 IMPLANT
DRAPE C-ARM XRAY 36X54 (DRAPES) ×6 IMPLANT
DRAPE LAPAROTOMY 77X122 PED (DRAPES) ×4 IMPLANT
DRAPE MICROSCOPE SPINE 48X150 (DRAPES) ×5 IMPLANT
DRAPE SURG 17X11 SM STRL (DRAPES) ×3 IMPLANT
DRSG OPSITE POSTOP 4X6 (GAUZE/BANDAGES/DRESSINGS) ×4 IMPLANT
DRSG TEGADERM 2-3/8X2-3/4 SM (GAUZE/BANDAGES/DRESSINGS) ×3 IMPLANT
ELECTRODE CAUTERY BLDE TIP 2.5 (TIP) ×3 IMPLANT
ELECTRODE REM PT RTRN 9FT ADLT (ELECTROSURGICAL) ×6 IMPLANT
EVACUATOR SILICONE 100CC (DRAIN) IMPLANT
FEE INTRAOP CADWELL SUPPLY NCS (MISCELLANEOUS) ×1 IMPLANT
FEE INTRAOP MONITOR IMPULS NCS (MISCELLANEOUS) ×1 IMPLANT
GLOVE BIOGEL PI IND STRL 7.0 (GLOVE) ×6 IMPLANT
GLOVE BIOGEL PI IND STRL 8 (GLOVE) ×15 IMPLANT
GLOVE BIOGEL PI IND STRL 8.5 (GLOVE) ×6 IMPLANT
GLOVE SURG SYN 7.0 (GLOVE) ×6 IMPLANT
GLOVE SURG SYN 7.0 PF PI (GLOVE) ×4 IMPLANT
GLOVE SURG SYN 7.5 E (GLOVE) ×15 IMPLANT
GLOVE SURG SYN 7.5 PF PI (GLOVE) ×10 IMPLANT
GOWN SRG LRG LVL 4 IMPRV REINF (GOWNS) ×6 IMPLANT
GOWN SRG XL LVL 3 NONREINFORCE (GOWNS) ×3 IMPLANT
GOWN STRL REUS W/ TWL XL LVL3 (GOWN DISPOSABLE) ×3 IMPLANT
GRADUATE 1200CC STRL 31836 (MISCELLANEOUS) ×2 IMPLANT
KIT TURNOVER KIT A (KITS) ×6 IMPLANT
LAVAGE JET IRRISEPT WOUND (IRRIGATION / IRRIGATOR) ×2 IMPLANT
MANIFOLD NEPTUNE II (INSTRUMENTS) ×6 IMPLANT
MARKER SKIN DUAL TIP RULER LAB (MISCELLANEOUS) ×4 IMPLANT
NDL SAFETY ECLIPSE 18X1.5 (NEEDLE) IMPLANT
NS IRRIG 1000ML POUR BTL (IV SOLUTION) ×3 IMPLANT
NS IRRIG 500ML POUR BTL (IV SOLUTION) ×3 IMPLANT
PACK LAMINECTOMY ARMC (PACKS) ×6 IMPLANT
PAD ARMBOARD POSITIONER FOAM (MISCELLANEOUS) ×9 IMPLANT
PIN CASPAR 14 (PIN) ×2 IMPLANT
PIN CASPAR 14MM (PIN) IMPLANT
PIN CASPAR SPINAL 12MM (PIN) ×1 IMPLANT
SPONGE KITTNER 5P (MISCELLANEOUS) ×5 IMPLANT
STAPLER SKIN PROX 35W (STAPLE) IMPLANT
SURGIFLO W/THROMBIN 8M KIT (HEMOSTASIS) ×5 IMPLANT
SUT STRATA 3-0 15 PS-2 (SUTURE) IMPLANT
SUT STRATA 3-0 30 PS-1 (SUTURE) IMPLANT
SUT VIC AB 3-0 SH 8-18 (SUTURE) ×3 IMPLANT
SUT VICRYL 3-0 CR8 SH (SUTURE) ×2 IMPLANT
SYR 20ML LL LF (SYRINGE) ×3 IMPLANT
SYR 30ML LL (SYRINGE) ×3 IMPLANT
TAPE CLOTH 3X10 WHT NS LF (GAUZE/BANDAGES/DRESSINGS) ×10 IMPLANT
TOWEL OR 17X26 4PK STRL BLUE (TOWEL DISPOSABLE) ×6 IMPLANT
TRAP FLUID SMOKE EVACUATOR (MISCELLANEOUS) ×6 IMPLANT
TRAY FOLEY SLVR 16FR LF STAT (SET/KITS/TRAYS/PACK) IMPLANT
TUBING CONNECTING 10 (TUBING) ×2 IMPLANT

## 2023-11-10 NOTE — Op Note (Signed)
 Indications: [name] is a 48 y.o. female with progressive cervical myeloradiculopathy.  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   Preoperative Note:   Risks of surgery discussed include: infection, bleeding, stroke, coma, death, paralysis, CSF leak, nerve/spinal cord injury, numbness, tingling, weakness, complex regional pain syndrome, recurrent stenosis and/or disc herniation, vascular injury, development of instability, neck/back pain, need for further surgery, persistent symptoms, development of deformity, and the risks of anesthesia. The patient understood these risks and agreed to proceed.  Operative Note:  Procedure:  1) Cervical Disc Arthroplasty at C5/c7 using a LDR Mobi-C device   Procedure: After obtaining informed consent, the patient taken to the operating room, placed in supine position, general anesthesia induced.  The patient had a small shoulder roll placed behind the neck.  The patient received preop antibiotics and IV Decadron.  The patient had a neck incision outlined, was prepped and draped in usual sterile fashion. The incision was injected with local anesthetic.   An incision was opened, dissection taken down medial to the carotid artery and jugular vein, lateral to the trachea and esophagus.  The prevertebral fascia identified and a localizing x-ray demonstrated the correct level.  The longus colli were dissected laterally, and self-retaining retractors  placed to open the operative field. The microscope was then brought into the field.  With this complete, distractor pins were placed in the vertebral bodies of C5 and C6. The distractor was placed, and the annulus at C5/6 was opened using a bovie.  Curettes and pituitary rongeurs used to remove the majority of disk, then the drill was used to remove the posterior osteophyte and begin the foraminotomies. The nerve hook was used to elevate the posterior longitudinal ligament, which was then removed with Kerrison rongeurs. The microblunt nerve hook could be passed out the foramen bilaterally.   Meticulous hemostasis was obtained.  A trial spacer was used to size the disc space. Using flouroscopic guidance, a 15 mm width x 5 mm depth x 5 mm height Mobi-C was then inserted in the prepared disc space.  The caspar distractor was removed, and bone wax used for hemostasis.   We then moved our attention to the second level.  We placed a Caspar pin at the C7 level and distracted the annulus.  We are able to remove the disc with Kerrison rongeurs and curettes.  This brought us  to the back of the vertebral body which showed some lateral osteophytes.  The remainder of the disc was drilled out until we are able to see the posterior longitudinal ligament.  We are able to get behind the posterior longitudinal ligament with a small upgoing curette.  We then Kerrison out the remaining ligamentous tissue and were able to feel out the foramen bilaterally we then prepared the endplate, using fluoroscopic guidance a 15 mm wide by 5 mm deep by 5 mm height Mobi-C was then placed into the prepared disc space.  The wound was copiously irrigated.  Final AP and lateral radiographs were taken.   With the disc arthroplasty in good position, the wound was  irrigated copiously and meticulous hemostasis obtained.  Wound was closed in 2 layers using interrupted inverted 3-0 Vicryl sutures.  The wound was dressed with dermabond, the head of  bed at 30 degrees, taken to recovery room in stable condition.  No new postop neurological deficits were identified.  Sponge and pattie counts were correct at the end of the procedure.     I performed the entire procedure with the assistance of Ludwig Safer, [place] as an Designer, television/film set. An assistant was required for this procedure due to the complexity.  The assistant provided assistance in tissue manipulation and suction, and was required for the successful and safe performance of the procedure. I performed the critical portions of the procedure.   Carroll Clamp, MD

## 2023-11-10 NOTE — Transfer of Care (Signed)
 Immediate Anesthesia Transfer of Care Note  Patient: [name]  Procedure(s) Performed: CERVICAL ANTERIOR DISC ARTHROPLASTY Levels 5-7 (Spine Cervical) CERVICAL ANTERIOR DISC ARTHROPLASTY, 2 LEVEL (Spine Cervical)  Patient Location: PACU  Anesthesia Type:General  Level of Consciousness: drowsy and patient cooperative  Airway & Oxygen Therapy: Patient Spontanous Breathing and Patient connected to face mask oxygen  Post-op Assessment: Report given to RN, Post -op Vital signs reviewed and stable, and Patient moving all extremities X 4  Post vital signs: Reviewed and stable  Last Vitals:  Vitals Value Taken Time  BP 97/62 11/10/23 1230  Temp 36 C 11/10/23 1224  Pulse 77 11/10/23 1234  Resp 18 11/10/23 1234  SpO2 91 % 11/10/23 1234  Vitals shown include unfiled device data.  Last Pain:  Vitals:   11/10/23 0823  TempSrc: Tympanic  PainSc: 0-No pain         Complications: No notable events documented.

## 2023-11-10 NOTE — Discharge Instructions (Signed)
 Your surgeon has performed an operation on your cervical spine (neck) to relieve pressure on the spinal cord and/or nerves. This involved making an incision in the front of your neck and removing one or more of the discs that support your spine. Next, a small piece of bone, a titanium plate, and screws were used to fuse two or more of the vertebrae (bones) together.  The following are instructions to help in your recovery once you have been discharged from the hospital. Even if you feel well, it is important that you follow these activity guidelines. If you do not let your neck heal properly from the surgery, you can increase the chance of return of your symptoms and other complications.  * Do not take anti-inflammatory medications for 3 months after surgery (naproxen [Aleve], ibuprofen [Advil, Motrin], celecoxib [Celebrex], etc.). These medications can prevent your bones from healing properly.  Activity    No bending, lifting, or twisting ("BLT"). Avoid lifting objects heavier than 10 pounds (gallon milk jug).  Where possible, avoid household activities that involve lifting, bending, reaching, pushing, or pulling such as laundry, vacuuming, grocery shopping, and childcare. Try to arrange for help from friends and family for these activities while your back heals.  Increase physical activity slowly as tolerated.  Taking short walks is encouraged, but avoid strenuous exercise. Do not jog, run, bicycle, lift weights, or participate in any other exercises unless specifically allowed by your doctor.  Talk to your doctor before resuming sexual activity.  You should not drive until cleared by your doctor.  Until released by your doctor, you should not return to work or school.  You should rest at home and let your body heal.   You may shower three days after your surgery.  After showering, lightly dab your incision dry. Do not take a tub bath or go swimming until approved by your doctor at your  follow-up appointment.  If your doctor ordered a cervical collar (neck brace) for you, you should wear it whenever you are out of bed. You may remove it when lying down or sleeping, but you should wear it at all other times. Not all neck surgeries require a cervical collar.  If you smoke, we strongly recommend that you quit.  Smoking has been proven to interfere with normal bone healing and will dramatically reduce the success rate of your surgery. Please contact [facility] (800-QUIT-NOW) and use the resources at www.[facility].com for assistance in stopping smoking.  Surgical Incision   If you have a dressing on your incision, you may remove it two days after your surgery. Keep your incision area clean and dry.  If you have staples or stitches on your incision, you should have a follow up scheduled for removal. If you do not have staples or stitches, you will have steri-strips (small pieces of surgical tape) or Dermabond glue. The steri-strips/glue should begin to peel away within about a week (it is fine if the steri-strips fall off before then). If the strips are still in place one week after your surgery, you may gently remove them.  Diet           You may return to your usual diet. However, you may experience discomfort when swallowing in the first month after your surgery. This is normal. You may find that softer foods are more comfortable for you to swallow. Be sure to stay hydrated.  When to Contact us  You may experience pain in your neck and/or pain between your shoulder  blades. This is normal and should improve in the next few weeks with the help of pain medication, muscle relaxers, and rest. Some patients report that a warm compress on the back of the neck or between the shoulder blades helps.  However, should you experience any of the following, contact us immediately: New numbness or weakness Pain that is progressively getting worse, and is not relieved by your pain medication,  muscle relaxers, rest, and warm compresses Bleeding, redness, swelling, pain, or drainage from surgical incision Chills or flu-like symptoms Fever greater than 101.0 F (38.3 C) Inability to eat, drink fluids, or take medications Problems with bowel or bladder functions Difficulty breathing or shortness of breath Warmth, tenderness, or swelling in your calf Contact Information How to contact us:  If you have any questions/concerns before or after surgery, you can reach [place] at [phone], or you can send a mychart message. We can be reached by phone or mychart 8am-4pm, Monday-Friday.  *Please note: Calls after 4pm are forwarded to a third party answering service. Mychart messages are not routinely monitored during evenings, weekends, and holidays. Please call our office to contact the answering service for urgent concerns during non-business hours.

## 2023-11-10 NOTE — Interval H&P Note (Signed)
 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  The various methods of treatment have been discussed with the patient and family. After consideration of risks, benefits and other options for treatment, the patient has consented to  Procedure(s) with comments: Cervical 5-7 arthorplasty CERVICAL ANTERIOR DISC ARTHROPLASTY CERVICAL ANTERIOR DISC ARTHROPLASTY, 2 LEVEL (N/A) as a surgical intervention.  The patient's history has been reviewed, patient examined, no change in status, stable for surgery.  I have reviewed the patient's chart and labs.  Questions were answered to the patient's satisfaction.    Heart and lungs clear   Carroll Clamp

## 2023-11-10 NOTE — Anesthesia Procedure Notes (Signed)
 Procedure Name: Intubation Date/Time: 11/10/2023 9:30 AM  Performed by: Aniceto Kern, RNPre-anesthesia Checklist: Patient identified, Emergency Drugs available, Suction available and Patient being monitored Patient Re-evaluated:Patient Re-evaluated prior to induction Oxygen Delivery Method: Circle system utilized Preoxygenation: Pre-oxygenation with 100% oxygen Induction Type: IV induction Ventilation: Mask ventilation without difficulty Laryngoscope Size: McGrath and 3 Grade View: Grade I Tube type: Oral Tube size: 7.0 mm Number of attempts: 1 Airway Equipment and Method: Stylet, Oral airway and Video-laryngoscopy Placement Confirmation: ETT inserted through vocal cords under direct vision, positive ETCO2 and breath sounds checked- equal and bilateral Secured at: 21 cm Tube secured with: Tape Dental Injury: Teeth and Oropharynx as per pre-operative assessment

## 2023-11-10 NOTE — Anesthesia Preprocedure Evaluation (Signed)
 Anesthesia Evaluation  Patient identified by MRN, date of birth, ID band Patient awake    Reviewed: Allergy & Precautions, H&P , NPO status , Patient's Chart, lab work & pertinent test results, reviewed documented beta blocker date and time   Airway Mallampati: II  TM Distance: >3 FB Neck ROM: Full    Dental  (+) Lower Dentures, Upper Dentures   Pulmonary shortness of breath and with exertion, COPD (denied h/o COPD, uses inhaler, last use - month ago),  COPD inhaler, Current Smoker and Patient abstained from smoking.   Pulmonary exam normal breath sounds clear to auscultation       Cardiovascular Exercise Tolerance: Good hypertension, Pt. on medications and Pt. on home beta blockers Normal cardiovascular exam Rhythm:Regular Rate:Normal     Neuro/Psych  PSYCHIATRIC DISORDERS Anxiety Depression    negative neurological ROS     GI/Hepatic Neg liver ROS,GERD  Medicated and Controlled,,  Endo/Other  negative endocrine ROS    Renal/GU      Musculoskeletal   Abdominal   Peds  Hematology negative hematology ROS (+)   Anesthesia Other Findings   Reproductive/Obstetrics negative OB ROS                             Anesthesia Physical Anesthesia Plan  ASA: 3  Anesthesia Plan: General ETT   Post-op Pain Management:    Induction: Intravenous  PONV Risk Score and Plan: 3 and Ondansetron , Dexamethasone, Midazolam, Propofol  infusion and TIVA  Airway Management Planned: Oral ETT  Additional Equipment:   Intra-op Plan:   Post-operative Plan: Extubation in OR  Informed Consent: I have reviewed the patients History and Physical, chart, labs and discussed the procedure including the risks, benefits and alternatives for the proposed anesthesia with the patient or authorized representative who has indicated his/her understanding and acceptance.     Dental Advisory Given  Plan Discussed with:  Anesthesiologist, CRNA and Surgeon  Anesthesia Plan Comments: (Patient consented for risks of anesthesia including but not limited to:  - adverse reactions to medications - damage to eyes, teeth, lips or other oral mucosa - nerve damage due to positioning  - sore throat or hoarseness - Damage to heart, brain, nerves, lungs, other parts of body or loss of life  Patient voiced understanding and assent.)       Anesthesia Quick Evaluation

## 2023-11-11 ENCOUNTER — Encounter: Payer: Self-pay | Admitting: Neurosurgery

## 2023-11-11 ENCOUNTER — Other Ambulatory Visit (HOSPITAL_COMMUNITY)

## 2023-11-11 ENCOUNTER — Other Ambulatory Visit: Payer: Self-pay

## 2023-11-11 DIAGNOSIS — R29898 Other symptoms and signs involving the musculoskeletal system: Secondary | ICD-10-CM | POA: Diagnosis not present

## 2023-11-11 DIAGNOSIS — M5000 Cervical disc disorder with myelopathy, unspecified cervical region: Secondary | ICD-10-CM | POA: Diagnosis not present

## 2023-11-11 DIAGNOSIS — M5412 Radiculopathy, cervical region: Secondary | ICD-10-CM | POA: Diagnosis not present

## 2023-11-11 DIAGNOSIS — L732 Hidradenitis suppurativa: Secondary | ICD-10-CM | POA: Diagnosis not present

## 2023-11-11 MED ORDER — CELECOXIB 200 MG PO CAPS
200.0000 mg | ORAL_CAPSULE | Freq: Two times a day (BID) | ORAL | 0 refills | Status: DC
Start: 2023-11-11 — End: 2023-12-21
  Filled 2023-11-11: qty 60, 30d supply, fill #0

## 2023-11-11 MED ORDER — SENNA 8.6 MG PO TABS
1.0000 | ORAL_TABLET | Freq: Two times a day (BID) | ORAL | 0 refills | Status: DC
  Filled 2023-11-11: qty 120, 60d supply, fill #0

## 2023-11-11 MED ORDER — CYCLOBENZAPRINE HCL 10 MG PO TABS
10.0000 mg | ORAL_TABLET | Freq: Three times a day (TID) | ORAL | 0 refills | Status: DC | PRN
  Filled 2023-11-11 (×2): qty 90, 30d supply, fill #0

## 2023-11-11 MED ORDER — ORAL CARE MOUTH RINSE: 15.0000 mL | OROMUCOSAL | Status: DC | PRN

## 2023-11-11 MED ORDER — OXYCODONE HCL 5 MG PO TABS
5.0000 mg | ORAL_TABLET | ORAL | 0 refills | Status: DC | PRN
  Filled 2023-11-11: qty 30, 5d supply, fill #0

## 2023-11-11 NOTE — Evaluation (Signed)
 Occupational Therapy Evaluation Patient Details Name: [name] MRN: [id number] DOB: [birth year] Today's Date: 11/11/2023   History of Present Illness   Pt is a 48 yo female s/p cervical anterior disc arthroplasty levels 5-7. PMH of SOB, COPD, HTN, anxiety     Clinical Impressions Pt was seen for OT evaluation this date.  PTA, pt was living in a one level home with her daughter who works. Pt was IND with all ADLs, IADLs and mobility without AD use. She did not drive and reports having to use a grocery cart to ambulate through grocery store mostly d/t her breathing. She will be moving in with her friend for a few weeks who will provide 24/7 care while she recovers.  Pt seen for OT evaluation this date, POD#1. Currently pt is SUP level with all toileting tasks, LB dressing and Min A with UB dressing. She ambulated without AD short distances within the room with SBA/SUP and slower pace. Pt and daughter were educated in cervical precautions, self care skills, AE, and home/routines modifications to maximize safety and functional independence while minimizing falls risk and maintaining precautions. Pt verbalized understanding of all education/training provided. Able to return demonstration safe techniques while maintaining cervical precautions throughout. Handout provided to support recall and carry over of learned precautions/techniques for bed mobility, functional transfers, and self care skills. No additional skilled OT needs at this time. Will discharge in house. Upon hospital discharge, pt safe to discharge home and may benefit from [facility] therapy services to maximize safe return to PLOF.      If plan is discharge home, recommend the following:   A little help with walking and/or transfers;A little help with bathing/dressing/bathroom;Assistance with cooking/housework;Help with stairs or ramp for entrance     Functional Status Assessment   Patient has had a recent decline in their  functional status and demonstrates the ability to make significant improvements in function in a reasonable and predictable amount of time.     Equipment Recommendations         Recommendations for Other Services         Precautions/Restrictions   Precautions Precautions: Cervical Precaution Booklet Issued: Yes (comment) Recall of Precautions/Restrictions: Intact Restrictions Other Position/Activity Restrictions: no lifting over 5#     Mobility Bed Mobility               General bed mobility comments: seated EOB on entry    Transfers Overall transfer level: Needs assistance   Transfers: Sit to/from Stand Sit to Stand: Supervision           General transfer comment: supervision level for STS and in room mobility without AD, although slower; may benefit from walker use for farther distsances      Balance Overall balance assessment: Needs assistance   Sitting balance-Leahy Scale: Good       Standing balance-Leahy Scale: Fair Standing balance comment: no LOB, although slower gait in room without AD; no LOB during dressing and toileting tasks                           ADL either performed or assessed with clinical judgement   ADL Overall ADL's : Needs assistance/impaired     Grooming: Wash/dry hands;Supervision/safety;Standing           Upper Body Dressing : Minimal assistance;Sitting   Lower Body Dressing: Minimal assistance;Sitting/lateral leans   Toilet Transfer: Supervision/safety   Toileting- Clothing Manipulation and Hygiene: Supervision/safety  Vision         Perception         Praxis         Pertinent Vitals/Pain Pain Assessment Pain Assessment: 0-10 Pain Score: 7  Pain Location: anterior cervical region and shoulders/back Pain Descriptors / Indicators: Sore Pain Intervention(s): Monitored during session, Premedicated before session     Extremity/Trunk Assessment Upper Extremity  Assessment Upper Extremity Assessment: Right hand dominant;Generalized weakness;LUE deficits/detail LUE Deficits / Details: LUE weakness 3+/5 grip strength           Communication Communication Communication: No apparent difficulties   Cognition Arousal: Alert Behavior During Therapy: WFL for tasks assessed/performed Cognition: No apparent impairments                               Following commands: Intact       Cueing  General Comments      lowest sp02 92% on RA after dressing with DOE noted; edu on PLB/ECS/pacing during all activity performance   Exercises Other Exercises Other Exercises: Edu on role of OT in acute setting. Other Exercises: Edu on ECS/pacing/PLB during ADL and transfers/mobility. Other Exercises: Edu on cervical precautions and provided written handout to maximize carryover.   Shoulder Instructions      Home Living Family/patient expects to be discharged to:: Private residence Living Arrangements: Children (one daughter) Available Help at Discharge: Family;Friend(s);Available PRN/intermittently;Available 24 hours/day (will move in with best friend for a few weeks with 24/7 help) Type of Home: House Home Access: Stairs to enter Entrance Stairs-Number of Steps: 3 STE with bil HR her friend's home; 5 STE her homewith bil HR Entrance Stairs-Rails: Can reach both;Left;Right Home Layout: One level     Bathroom Shower/Tub: Producer, television/film/video: Standard         Additional Comments: normally lives with daughter, but will be moving in with best friend for a few weeks who will be available to assist 24/7      Prior Functioning/Environment Prior Level of Function : Independent/Modified Independent             Mobility Comments: IND, no falls in last 6 months ADLs Comments: IND with ADLs, IADLs, grocery shopping pushing cart; limited by breathing at times; does not drive    OT Problem List: Decreased  strength;Decreased activity tolerance   OT Treatment/Interventions:        OT Goals(Current goals can be found in the care plan section)       OT Frequency:       Co-evaluation              AM-PAC OT "6 Clicks" Daily Activity     Outcome Measure Help from another person eating meals?: None Help from another person taking care of personal grooming?: None Help from another person toileting, which includes using toliet, bedpan, or urinal?: None Help from another person bathing (including washing, rinsing, drying)?: A Little Help from another person to put on and taking off regular upper body clothing?: A Little Help from another person to put on and taking off regular lower body clothing?: A Little 6 Click Score: 21   End of Session Nurse Communication: Mobility status  Activity Tolerance: Patient tolerated treatment well Patient left:  (handoff to PT)  OT Visit Diagnosis: Other abnormalities of gait and mobility (R26.89)                Time: 1610-9604 OT  Time Calculation (min): 24 min Charges:  OT General Charges $OT Visit: 1 Visit OT Evaluation $OT Eval Moderate Complexity: 1 Mod OT Treatments $Self Care/Home Management : 8-22 mins Shevaun Lovan, OTR/L 11/11/23, 9:32 AM  Leonard Raker 11/11/2023, 9:32 AM

## 2023-11-11 NOTE — Discharge Summary (Signed)
 Discharge Summary  Patient ID: [name] MRN: [id number] DOB/AGE: 48-Dec-1977 48 y.o.  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  Her intraoperative course was uncomplicated.  She was admitted overnight for monitoring and therapy evaluation.  Her postoperative course was complicated by hypoxemia into the 80s requiring a breathing treatment and 2 L of nasal cannula.  She was able to be weaned off of oxygen on the morning of postop day 1.  She was seen by physical therapy and deemed appropriate for discharge home with home health services however her insurance would not cover this.  She was discharged with prescriptions for Flexeril , oxycodone , and senna to take as needed.  Consults: None  Significant Diagnostic Studies: see results review  Treatments: surgery: 5.  Please see separately dictated operative report for further details.  Discharge Exam: Blood pressure 104/70, pulse 69, temperature 97.8 F (36.6 C), temperature source Oral, resp. rate 15, height 5\' 2"  (1.575 m), weight 94.8 kg, last menstrual period 10/24/2023, SpO2 96%. AA Ox3 CNI   Side Biceps Triceps Deltoid Interossei Grip Wrist Ext. Wrist Flex.  R 5 5 5 5 5 5 5   L 4+ 4+ 4+ 3 4-  4 4      Side Iliopsoas Quads Hamstring PF DF EHL  R 5 5 5 5 5 5   L 4 4+ 4 4+ 3 4-   Incision: c/d/i  Disposition: Discharge disposition: 01-Home or Self Care       Discharge Instructions     Incentive spirometry RT   Complete by:  As directed       Allergies as of 11/11/2023       Reactions   Other    "laughing gas"= skin feels like it burning        Medication List     STOP taking these medications    dicyclomine  10 MG capsule Commonly known as: BENTYL        TAKE these medications    albuterol  108 (90 Base) MCG/ACT inhaler Commonly known as: VENTOLIN  HFA Inhale 2 puffs into the lungs every 6 (six) hours as needed for wheezing or shortness of breath.   amLODipine  10 MG tablet Commonly known as: NORVASC  Take 1 tablet (10 mg total) by mouth daily. What changed: when to take this   celecoxib 200 MG capsule Commonly known as: CELEBREX Take 1 capsule (200 mg total) by mouth every 12 (twelve) hours.   cyanocobalamin 1000 MCG tablet Commonly known as: VITAMIN B12 Take 1 tablet (1,000 mcg total) by mouth daily.   cyclobenzaprine  10 MG tablet Commonly known as: FLEXERIL  Take 1 tablet (10 mg total) by mouth 3 (three) times daily as needed for muscle spasms. What changed: when to take this   escitalopram  10 MG tablet Commonly known as: Lexapro  Take 1 tablet (10 mg total) by mouth daily. What changed: when to take this   Fish Oil  1000 MG Caps Take 2 capsules (2,000 mg total) by mouth in the morning and at bedtime.   gabapentin  300 MG capsule Commonly known as: Neurontin  Take 2 capsules (600 mg  total) by mouth 3 (three) times daily.   linaclotide  145 MCG Caps capsule Commonly known as: LINZESS  Take 1 capsule (145 mcg total) by mouth daily. What changed: when to take this   metoprolol  tartrate 50 MG tablet Commonly known as: LOPRESSOR  Take 1 tablet by mouth twice daily   omeprazole  40 MG capsule Commonly known as: PRILOSEC Take 1 capsule by mouth once daily What changed: when to take this   ondansetron  4 MG tablet Commonly known as: ZOFRAN  Take 1 tablet (4 mg total) by mouth every 8 (eight) hours as needed for nausea or vomiting. What changed: when to take this   oxybutynin  10  MG 24 hr tablet Commonly known as: DITROPAN -XL Take 2 tablets (20 mg total) by mouth at bedtime.   oxyCODONE  5 MG immediate release tablet Commonly known as: Oxy IR/ROXICODONE  Take 1 tablet (5 mg total) by mouth every 4 (four) hours as needed for severe pain (pain score 7-10).   rosuvastatin  5 MG tablet Commonly known as: Crestor  Take 1 tablet (5 mg total) by mouth daily. What changed: when to take this   senna 8.6 MG Tabs tablet Commonly known as: SENOKOT Take 1 tablet (8.6 mg total) by mouth 2 (two) times daily.   Vitamin D  125 MCG (5000 UT) Caps Take 5,000 Units by mouth daily.               Durable Medical Equipment  (From admission, onward)           Start     Ordered   11/11/23 0942  For home use only DME Walker rolling  Once       Question Answer Comment  Walker: With 5 Inch Wheels   Patient needs a walker to treat with the following condition Cervical myelopathy (HCC)      11/11/23 0941   11/11/23 0935  For home use only DME Walker rolling  Once       Question Answer Comment  Walker: With 5 Inch Wheels   Patient needs a walker to treat with the following condition Radiculopathy, cervical      11/11/23 0935            Follow-up Information     Ludwig Safer, PA-C Follow up in 13 day(s).   Specialty: Physician Assistant Contact information: [address] [place], [place] [address] [place] [address] [phone]                 Signed: Noble Bateman 11/11/2023, 9:56 AM

## 2023-11-11 NOTE — Plan of Care (Signed)
  Problem: Activity: Goal: Ability to avoid complications of mobility impairment will improve Outcome: Progressing Goal: Ability to tolerate increased activity will improve Outcome: Progressing   Problem: Pain Management: Goal: Pain level will decrease Outcome: Progressing   Problem: Bladder/Genitourinary: Goal: Urinary functional status for postoperative course will improve Outcome: Progressing

## 2023-11-11 NOTE — Plan of Care (Signed)
  Problem: Education: Goal: Ability to verbalize activity precautions or restrictions will improve Outcome: Progressing   Problem: Activity: Goal: Ability to avoid complications of mobility impairment will improve Outcome: Progressing   Problem: Pain Management: Goal: Pain level will decrease Outcome: Progressing   Problem: Health Behavior/Discharge Planning: Goal: Identification of resources available to assist in meeting health care needs will improve Outcome: Progressing

## 2023-11-11 NOTE — TOC Progression Note (Signed)
 Transition of Care [facility]) - Progression Note    Patient Details  Name: [name] MRN: [id number] Date of Birth: 1976/06/26  Transition of Care [facility]) CM/SW Contact  Alexandra Ice, RN Phone Number: 11/11/2023, 9:55 AM  Clinical Narrative:    Patient to discharge today. Unable to set up home health due to insurance. Patient will need to go to outpatient therapy. Patient needs RW, sent referral to Jon with [employer] for processing.     Barriers to Discharge: Barriers Resolved   Patient Goals and CMS Choice        Expected Discharge Plan and Services           Expected Discharge Date: 11/11/23               DME Arranged: Otho Blitz rolling DME Agency: [employer] Date DME Agency Contacted: 11/11/23 Time DME Agency Contacted: [phone] Representative spoke with at DME Agency: Montie Apley Arranged: NA          Prior Living Arrangements/Services                       Activities of Daily Living   ADL Screening (condition at time of admission) Independently performs ADLs?: Yes (appropriate for developmental age) Is the patient deaf or have difficulty hearing?: No Does the patient have difficulty seeing, even when wearing glasses/contacts?: No Does the patient have difficulty concentrating, remembering, or making decisions?: No  Permission Sought/Granted                  Emotional Assessment              Admission diagnosis:  Intervertebral cervical disc disorder with myelopathy, cervical region [M50.00] Left arm weakness [R29.898] Cervical radiculopathy [M54.12] Patient Active Problem List   Diagnosis Date Noted   Cervical radiculopathy 11/10/2023   Intervertebral cervical disc disorder with myelopathy, cervical region 10/21/2023   Left arm weakness 10/21/2023   Nausea and vomiting 09/08/2023   Hidradenitis suppurativa 08/05/2023   Depression, major, single episode, moderate (HCC) 08/05/2023   Irritable bowel syndrome with constipation  06/09/2023   Mixed stress and urge urinary incontinence 04/07/2023   Neck pain 04/01/2023   Early satiety 03/01/2023   Overactive bladder 11/05/2022   Diarrhea 05/05/2022   Loss of weight 05/05/2022   Vitamin B12 deficiency 04/12/2020   Vitamin D  deficiency 04/12/2020   Dizziness 04/12/2020   HTN (hypertension) 12/30/2019   Tobacco abuse 12/30/2019   GERD (gastroesophageal reflux disease) 12/30/2019   Spondylosis 12/30/2019   PCP:  Rosanna Comment, FNP Pharmacy:   [facility] [address] [place] - [address] [place] [address] Phone: [phone] Fax: [phone]  [facility] [address] [place] [address] Phone: [phone] Fax: [phone]     Social Determinants of Health (SDOH) Interventions    Readmission Risk Interventions     No data to display             Barriers to Discharge: Barriers Resolved  Expected Discharge Plan and Services         Expected Discharge Date: 11/11/23               DME Arranged: Otho Blitz rolling DME Agency: [employer] Date DME Agency Contacted: 11/11/23 Time DME Agency Contacted: [phone] Representative spoke with at DME Agency: Montie Apley Arranged: NA           Social Determinants of Health (SDOH)  Interventions SDOH Screenings   Food Insecurity: No Food Insecurity (11/10/2023)  Housing: Low Risk  (11/10/2023)  Transportation Needs: No Transportation Needs (11/10/2023)  Utilities: Not At Risk (11/10/2023)  Alcohol Screen: Low Risk  (08/04/2023)  Depression (PHQ2-9): High Risk (08/05/2023)  Financial Resource Strain: Low Risk  (08/04/2023)  Physical Activity: Insufficiently Active (08/04/2023)  Social Connections: Unknown (08/04/2023)  Stress: Stress Concern Present (08/04/2023)  Tobacco Use: High Risk (11/10/2023)    Readmission Risk Interventions     No data to display

## 2023-11-11 NOTE — Evaluation (Signed)
 Physical Therapy Evaluation Patient Details Name: [name] MRN: [id number] DOB: 16-Oct-1975 Today's Date: 11/11/2023  History of Present Illness  Pt is a 48 yo female s/p cervical anterior disc arthroplasty levels 5-7. PMH of SOB, COPD, HTN, anxiety  Clinical Impression  Pt alert, agreeable to PT, standing in room with OT upon PT arrival. Reported 8/10 cervical pain during session, RN notified. She was able to ambulate without RW, supervision-CGA, but improved comfort and stability noted with RW, agreeable to utilize at discharge. She was able to ambulate ~265ft supervision, and perform stair navigation with CGA, steady and safe technique noted. Pt exhibited increased antalgic gait on LLE with fatigue, and reported decreased sensation to nearly entire LLE (also present prior to surgery). Pt returned to bed with supervision, needs in reach.  Overall the patient demonstrated deficits (see "PT Problem List") that impede the patient's functional abilities, safety, and mobility and would benefit from skilled PT intervention.          If plan is discharge home, recommend the following: A little help with bathing/dressing/bathroom;Assistance with cooking/housework;Assist for transportation;Help with stairs or ramp for entrance   Can travel by private vehicle        Equipment Recommendations Rolling walker (2 wheels)  Recommendations for Other Services       Functional Status Assessment Patient has had a recent decline in their functional status and demonstrates the ability to make significant improvements in function in a reasonable and predictable amount of time.     Precautions / Restrictions Precautions Precautions: Cervical Precaution Booklet Issued: Yes (comment) Recall of Precautions/Restrictions: Intact Restrictions Other Position/Activity Restrictions: no lifting over 5#      Mobility  Bed Mobility Overal bed mobility: Needs Assistance Bed Mobility: Sit to Supine        Sit to supine: Supervision, Used rails, HOB elevated        Transfers Overall transfer level: Needs assistance Equipment used: Rolling walker (2 wheels), None Transfers: Sit to/from Stand Sit to Stand: Contact guard assist, Supervision           General transfer comment: able to ambulate without RW, but very slow, careful. pt also SOB, spO2 90-95%    Ambulation/Gait Ambulation/Gait assistance: Supervision Gait Distance (Feet): 220 Feet Assistive device: Rolling walker (2 wheels)         General Gait Details: progressed to supervision with use of RW  Stairs Stairs: Yes Stairs assistance: Contact guard assist Stair Management: One rail Left, Step to pattern Number of Stairs: 4 General stair comments: steady, safe  Wheelchair Mobility     Tilt Bed    Modified Rankin (Stroke Patients Only)       Balance Overall balance assessment: Needs assistance   Sitting balance-Leahy Scale: Good       Standing balance-Leahy Scale: Fair                               Pertinent Vitals/Pain Pain Assessment Pain Assessment: 0-10 Pain Score: 8  Pain Location: anterior cervical region and shoulders/back Pain Descriptors / Indicators: Sore Pain Intervention(s): Limited activity within patient's tolerance, Monitored during session, Repositioned    Home Living Family/patient expects to be discharged to:: Private residence Living Arrangements: Children (one daughter) Available Help at Discharge: Family;Friend(s);Available PRN/intermittently;Available 24 hours/day (will move in with best friend for a few weeks with 24/7 help) Type of Home: House Home Access: Stairs to enter Entrance Stairs-Rails: Can reach both;Left;Right Entrance  Stairs-Number of Steps: 3 STE with bil HR her friend's home; 5 STE her homewith bil HR   Home Layout: One level   Additional Comments: normally lives with daughter, but will be moving in with best friend for a few weeks who  will be available to assist 24/7    Prior Function Prior Level of Function : Independent/Modified Independent             Mobility Comments: IND, no falls in last 6 months ADLs Comments: IND with ADLs, IADLs, grocery shopping pushing cart; limited by breathing at times; does not drive     Extremity/Trunk Assessment   Upper Extremity Assessment Upper Extremity Assessment: Defer to OT evaluation LUE Deficits / Details: LUE weakness 3+/5 grip strength    Lower Extremity Assessment Lower Extremity Assessment:  (LLE noted to fatigue with distance, but able to achieve MMT positioning bilaterally. reported decreased sensation to entire LLE, except L5)    Cervical / Trunk Assessment Cervical / Trunk Assessment: Neck Surgery  Communication   Communication Communication: No apparent difficulties    Cognition Arousal: Alert Behavior During Therapy: WFL for tasks assessed/performed                             Following commands: Intact       Cueing       General Comments General comments (skin integrity, edema, etc.): lowest sp02 92% on RA after dressing with DOE noted; edu on PLB/ECS/pacing during all activity performance    Exercises     Assessment/Plan    PT Assessment Patient needs continued PT services  PT Problem List Decreased strength;Decreased range of motion;Decreased activity tolerance;Decreased balance;Decreased knowledge of precautions;Pain       PT Treatment Interventions DME instruction;Balance training;Gait training;Neuromuscular re-education;Stair training;Functional mobility training;Patient/family education;Therapeutic activities;Therapeutic exercise    PT Goals (Current goals can be found in the Care Plan section)  Acute Rehab PT Goals Patient Stated Goal: to go home PT Goal Formulation: With patient Time For Goal Achievement: 11/25/23 Potential to Achieve Goals: Good    Frequency Min 2X/week     Co-evaluation                AM-PAC PT "6 Clicks" Mobility  Outcome Measure Help needed turning from your back to your side while in a flat bed without using bedrails?: None Help needed moving from lying on your back to sitting on the side of a flat bed without using bedrails?: None Help needed moving to and from a bed to a chair (including a wheelchair)?: None Help needed standing up from a chair using your arms (e.g., wheelchair or bedside chair)?: A Little Help needed to walk in hospital room?: A Little Help needed climbing 3-5 steps with a railing? : A Little 6 Click Score: 21    End of Session Equipment Utilized During Treatment: Gait belt Activity Tolerance: Patient tolerated treatment well Patient left: in bed;with call bell/phone within reach Nurse Communication: Mobility status PT Visit Diagnosis: Other abnormalities of gait and mobility (R26.89);Difficulty in walking, not elsewhere classified (R26.2);Muscle weakness (generalized) (M62.81);Pain Pain - part of body:  (cervical)    Time: 3474-2595 PT Time Calculation (min) (ACUTE ONLY): 14 min   Charges:   PT Evaluation $PT Eval Low Complexity: 1 Low PT Treatments $Therapeutic Activity: 8-22 mins PT General Charges $$ ACUTE PT VISIT: 1 Visit         Darien Eden PT, DPT 11:42 AM,11/11/23

## 2023-11-11 NOTE — Progress Notes (Signed)
 Occupational Therapy * Physical Therapy * Speech Therapy  DATE 11/11/2023 PATIENT NAME [name], [name] PATIENT MRN: [id number]  DIAGNOSIS/DIAGNOSIS CODE: M54.12 DATE OF DISCHARGE: 11/11/2023  PRIMARY CARE PHYSICIAN: Rosanna Comment FNP PCP PHONE/FAX: Phone: [phone]  Dear Provider:    I certify that I have examined this patient and that occupational/physical/speech therapy is necessary on an outpatient basis.    The patient has expressed interest in completing their recommended course of therapy at your location.  Once a formal order from the patient's primary care physician has been obtained, please contact him/her to schedule an appointment for evaluation at your earliest convenience.  [email or web address] ]  Physical Therapy Evaluate and Treat  [ X]  Occupational Therapy Evaluate and Treat  [  ]  Speech Therapy Evaluate and Treat  The patient's primary care physician (listed above) must furnish and be responsible for a formal order such that the recommended services may be furnished while under the primary physician's care, and that the plan of care will be established and reviewed every 30 days (or more often if condition necessitates).

## 2023-11-11 NOTE — Anesthesia Postprocedure Evaluation (Signed)
 Anesthesia Post Note  Patient: [name]  Procedure(s) Performed: CERVICAL ANTERIOR DISC ARTHROPLASTY Levels 5-7 (Spine Cervical) CERVICAL ANTERIOR DISC ARTHROPLASTY, 2 LEVEL (Spine Cervical)  Patient location during evaluation: PACU Anesthesia Type: General Level of consciousness: awake and alert Pain management: pain level controlled Vital Signs Assessment: post-procedure vital signs reviewed and stable Respiratory status: spontaneous breathing, nonlabored ventilation, respiratory function stable and patient connected to nasal cannula oxygen Cardiovascular status: blood pressure returned to baseline and stable Postop Assessment: no apparent nausea or vomiting Anesthetic complications: no  No notable events documented.   Last Vitals:  Vitals:   11/10/23 2234 11/11/23 0247  BP: 112/72 113/69  Pulse: 75 71  Resp: 14 16  Temp: (!) 36.1 C (!) 36.3 C  SpO2: 92% 93%    Last Pain:  Vitals:   11/11/23 0630  TempSrc:   PainSc: Asleep                 Enrique Harvest

## 2023-11-11 NOTE — Progress Notes (Signed)
   Neurosurgery Progress Note  History: [name] is s/p C5-7 arthroplasty   POD1: Pt having some hypoxemia overnight requiring 2L of O2  Physical Exam: Vitals:   11/10/23 2234 11/11/23 0247  BP: 112/72 113/69  Pulse: 75 71  Resp: 14 16  Temp: (!) 97 F (36.1 C) (!) 97.3 F (36.3 C)  SpO2: 92% 93%    AA Ox3 CNI  Side Biceps Triceps Deltoid Interossei Grip Wrist Ext. Wrist Flex.  R 5 5 5 5 5 5 5   L 4+ 4+ 4+ 3 4-  4 4      Side Iliopsoas Quads Hamstring PF DF EHL  R 5 5 5 5 5 5   L 4 4+ 4 4+ 3 4-   Incision: c/d/i  Data:  Other tests/results: see results review  Assessment/Plan:  [name] is a 48 y.o s/p C5-7 arthroplasty for cervical stenosis and left arm weakness.   - mobilize - pain control - DVT prophylaxis - PTOT; recommending HH  Anastacio Karvonen PA-C Department of Neurosurgery

## 2023-11-11 NOTE — TOC Transition Note (Signed)
 Transition of Care [facility]) - Discharge Note   Patient Details  Name: [name] MRN: [id number] Date of Birth: [birth year]  [facility]) CM/SW Contact:  Alexandra Ice, RN Phone Number: 11/11/2023, 11:37 AM   Clinical Narrative:     Patient discharged to home. Requested to go to [facility] outpatient rehab at [place]. Sent referral.   Final next level of care: OP Rehab Barriers to Discharge: Barriers Resolved   Patient Goals and CMS Choice            Discharge Placement                Patient to be transferred to facility by: Family Name of family member notified: [name] Patient and family notified of of transfer: 11/11/23  Discharge Plan and Services Additional resources added to the After Visit Summary for                  DME Arranged: Walker rolling DME Agency: [employer] Date DME Agency Contacted: 11/11/23 Time DME Agency Contacted: [phone] Representative spoke with at DME Agency: Montie Apley Arranged: NA          Social Drivers of Health (SDOH) Interventions SDOH Screenings   Food Insecurity: No Food Insecurity (11/10/2023)  Housing: Low Risk  (11/10/2023)  Transportation Needs: No Transportation Needs (11/10/2023)  Utilities: Not At Risk (11/10/2023)  Alcohol Screen: Low Risk  (08/04/2023)  Depression (PHQ2-9): High Risk (08/05/2023)  Financial Resource Strain: Low Risk  (08/04/2023)  Physical Activity: Insufficiently Active (08/04/2023)  Social Connections: Unknown (08/04/2023)  Stress: Stress Concern Present (08/04/2023)  Tobacco Use: High Risk (11/10/2023)     Readmission Risk Interventions     No data to display

## 2023-11-11 NOTE — Progress Notes (Signed)
 DISCHARGE NOTE:   Pt dc with IV removed and pt provided with dc instructions and education. Pt had rolling walker as well mediations delivered to hospital room. Pt voices no questions or concerns at this time. Pt wheeled down to medical mall entrance by staff. Pt's friend provided transportation.

## 2023-11-21 ENCOUNTER — Other Ambulatory Visit: Payer: Self-pay | Admitting: Family Medicine

## 2023-11-21 DIAGNOSIS — I10 Essential (primary) hypertension: Secondary | ICD-10-CM

## 2023-11-23 ENCOUNTER — Encounter: Payer: Self-pay | Admitting: Physician Assistant

## 2023-11-23 ENCOUNTER — Ambulatory Visit (INDEPENDENT_AMBULATORY_CARE_PROVIDER_SITE_OTHER): Admitting: Physician Assistant

## 2023-11-23 ENCOUNTER — Other Ambulatory Visit: Payer: Self-pay | Admitting: Family Medicine

## 2023-11-23 VITALS — BP 118/78 | Temp 97.7°F | Wt 209.0 lb

## 2023-11-23 DIAGNOSIS — M5010 Cervical disc disorder with radiculopathy, unspecified cervical region: Secondary | ICD-10-CM

## 2023-11-23 DIAGNOSIS — Z09 Encounter for follow-up examination after completed treatment for conditions other than malignant neoplasm: Secondary | ICD-10-CM

## 2023-11-23 DIAGNOSIS — F321 Major depressive disorder, single episode, moderate: Secondary | ICD-10-CM

## 2023-11-23 NOTE — Progress Notes (Signed)
   REFERRING PHYSICIAN:  Del Abron Abt, Fnp [phone] [address],  [place] [address]  DOS: 11/10/23, C5-7 arthroplasty  HISTORY OF PRESENT ILLNESS: [name] is 2 weeks status post C5-7 arthroplasty. Overall, she is doing well.  She feels that the tingling and burning in her hand has improved and her expected neck pain is not severe.  She feels as though the strength in her hands and legs have also improved.  She feels as though her gait is much steadier.  She continues to take her Celebrex , gabapentin , and oxycodone  as needed.  PHYSICAL EXAMINATION:  NEUROLOGICAL:  General: In no acute distress.   Awake, alert, oriented to person, place, and time.  Pupils equal round and reactive to light.  Facial tone is symmetric.    Strength:  Side Biceps Triceps Deltoid Interossei Grip Wrist Ext. Wrist Flex.  R 5 5 5 5 5 5 5   L 4+ 4+ 4+ 4+ 4 4 4        Side Iliopsoas Quads Hamstring PF DF EHL  R 5 5 5 5 5 5   L 5 5 5 5  4+ 4+     Incision c/d/I  Imaging:  No new imaging prior to her appointment today.  Assessment / Plan: [name] is 2 weeks status post C5-7 arthroplasty. Overall, she is doing well.  She feels that the tingling and burning in her hand has improved and her expected neck pain is not severe.  She feels as though the strength in her hands and legs have also improved.  She feels as though her gait is much steadier.  She continues to take her Celebrex , gabapentin , and oxycodone  as needed.  Thankfully, her examination has improved significantly.We discussed activity escalation and I have advised the patient to lift up to 10 pounds until 6 weeks after surgery, then increase up to 25 pounds until 12 weeks after surgery.  After 12 weeks post-op, the patient advised to increase activity as tolerated. she will return to clinic in approximately 1 month for 6-week postop with Dr. Felipe Horton with x-rays completed prior to clinic.    Advised to contact the office  if any questions or concerns arise.   Ludwig Safer PA-C Dept of Neurosurgery

## 2023-11-30 ENCOUNTER — Telehealth: Payer: Self-pay | Admitting: Neurosurgery

## 2023-11-30 ENCOUNTER — Other Ambulatory Visit: Payer: Self-pay | Admitting: Physician Assistant

## 2023-11-30 MED ORDER — OXYCODONE HCL 5 MG PO TABS: 5.0000 mg | ORAL_TABLET | ORAL | 0 refills | Status: DC | PRN

## 2023-11-30 NOTE — Telephone Encounter (Signed)
 C5-7 Arthroplasty   Patient is calling to request a refill of Oxycodone  5mg  be sent to [facility].

## 2023-12-03 ENCOUNTER — Ambulatory Visit: Payer: Medicaid Other | Admitting: Family Medicine

## 2023-12-03 ENCOUNTER — Encounter: Payer: Self-pay | Admitting: Family Medicine

## 2023-12-03 VITALS — BP 102/70 | HR 72 | Ht 62.0 in | Wt 222.2 lb

## 2023-12-03 DIAGNOSIS — I10 Essential (primary) hypertension: Secondary | ICD-10-CM | POA: Diagnosis not present

## 2023-12-03 DIAGNOSIS — E559 Vitamin D deficiency, unspecified: Secondary | ICD-10-CM | POA: Diagnosis not present

## 2023-12-03 DIAGNOSIS — R0602 Shortness of breath: Secondary | ICD-10-CM | POA: Insufficient documentation

## 2023-12-03 DIAGNOSIS — R7301 Impaired fasting glucose: Secondary | ICD-10-CM

## 2023-12-03 DIAGNOSIS — E038 Other specified hypothyroidism: Secondary | ICD-10-CM | POA: Diagnosis not present

## 2023-12-03 MED ORDER — BUDESONIDE-FORMOTEROL FUMARATE 80-4.5 MCG/ACT IN AERO: 2.0000 | INHALATION_SPRAY | Freq: Two times a day (BID) | RESPIRATORY_TRACT | 3 refills | Status: DC

## 2023-12-03 NOTE — Progress Notes (Signed)
 Established Patient Office Visit   Subjective  Patient ID: [name], female    DOB: [birth year]  Age: 48 y.o. MRN: [id number]  Chief Complaint  Patient presents with   Neck Pain    She  has a past medical history of Anxiety, Asthma, Depression, Dyspnea, GERD (gastroesophageal reflux disease), Hidradenitis suppurativa, History of hiatal hernia, Hyperlipidemia, Hypertension, IBS (irritable bowel syndrome), Intervertebral disc disorder with myelopathy, cervical region, OAB (overactive bladder), Tobacco use, Vitamin B 12 deficiency, and Vitamin D  deficiency.  Shortness of Breath  The patient presents with a new onset of shortness of breath that is persistent and has been gradually worsening. Associated symptoms include headaches and sputum production. She denies abdominal pain, chest pain, or fever. The dyspnea is exacerbated by any physical activity. Relevant risk factors include a history of smoking and recent prolonged immobilization. The patient has trialed beta-agonist inhalers without symptom relief. Her medical history is notable for a recent neck surgical procedure.       Review of Systems  Constitutional:  Negative for chills and fever.  Respiratory:  Positive for cough, sputum production and shortness of breath.   Cardiovascular:  Negative for chest pain.  Gastrointestinal:  Negative for abdominal pain.  Genitourinary:  Negative for dysuria.  Musculoskeletal:  Positive for myalgias.  Neurological:  Positive for dizziness and headaches.      Objective:     BP 102/70   Pulse 72   Ht 5\' 2"  (1.575 m)   Wt 222 lb 4 oz (100.8 kg)   LMP 10/24/2023 (Exact Date) Comment: Tubal ligation  SpO2 93%   BMI 40.65 kg/m  BP Readings from Last 3 Encounters:  12/03/23 102/70  11/23/23 118/78  11/11/23 123/69      Physical Exam Vitals reviewed.  Constitutional:      General: She is not in acute distress.    Appearance: Normal appearance. She is not ill-appearing,  toxic-appearing or diaphoretic.  HENT:     Head: Normocephalic.  Eyes:     General:        Right eye: No discharge.        Left eye: No discharge.     Conjunctiva/sclera: Conjunctivae normal.  Cardiovascular:     Rate and Rhythm: Normal rate.     Pulses: Normal pulses.     Heart sounds: Normal heart sounds.  Pulmonary:     Effort: Pulmonary effort is normal. No respiratory distress.     Breath sounds: Normal breath sounds.  Abdominal:     Palpations: Abdomen is soft.  Skin:    General: Skin is warm and dry.     Capillary Refill: Capillary refill takes less than 2 seconds.  Neurological:     Mental Status: She is alert.     Coordination: Coordination normal.     Gait: Gait normal.  Psychiatric:        Mood and Affect: Mood normal.        Behavior: Behavior normal.      No results found for any visits on 12/03/23.  The 10-year ASCVD risk score (Arnett DK, et al., 2019) is: 5.5%    Assessment & Plan:  Primary hypertension Assessment & Plan: Vitals:   12/03/23 0826 12/03/23 0853  BP: 99/66 102/70    Continue Amlodipine  10 mg once daily and Metoprolol  50 mg twice daily Labs ordered Continued discussion on DASH diet, low sodium diet and maintain a exercise routine for 150 minutes per week.   Orders: -  BMP8+eGFR -     CBC with Differential/Platelet -     Lipid panel  IFG (impaired fasting glucose) -     Hemoglobin A1c  Vitamin D  deficiency -     VITAMIN D  25 Hydroxy (Vit-D Deficiency, Fractures)  TSH (thyroid-stimulating hormone deficiency) -     TSH + free T4  SOB (shortness of breath) Assessment & Plan: Chest Xray ordered Dicussed with patient worsening signs visit ED   Orders: -     DG Chest 2 View; Future -     Budesonide-Formoterol Fumarate; Inhale 2 puffs into the lungs 2 (two) times daily.  Dispense: 1 each; Refill: 3    Return in about 4 months (around 04/04/2024), or if symptoms worsen or fail to improve, for chronic follow-up.   Avelino Lek Amber Bail, FNP

## 2023-12-03 NOTE — Patient Instructions (Signed)

## 2023-12-03 NOTE — Assessment & Plan Note (Signed)
 Vitals:   12/03/23 0826 12/03/23 0853  BP: 99/66 102/70    Continue Amlodipine  10 mg once daily and Metoprolol  50 mg twice daily Labs ordered Continued discussion on DASH diet, low sodium diet and maintain a exercise routine for 150 minutes per week.

## 2023-12-03 NOTE — Assessment & Plan Note (Addendum)
 Chest Xray ordered Dicussed with patient worsening signs visit ED

## 2023-12-04 ENCOUNTER — Ambulatory Visit (HOSPITAL_COMMUNITY)
Admission: RE | Admit: 2023-12-04 | Discharge: 2023-12-04 | Disposition: A | Discharge status: Home / Self Care | Source: Ambulatory Visit | Attending: Family Medicine | Admitting: Family Medicine

## 2023-12-04 ENCOUNTER — Other Ambulatory Visit (HOSPITAL_COMMUNITY): Payer: Self-pay

## 2023-12-04 ENCOUNTER — Telehealth: Payer: Self-pay | Admitting: Pharmacy Technician

## 2023-12-04 ENCOUNTER — Ambulatory Visit: Payer: Self-pay | Admitting: Family Medicine

## 2023-12-04 ENCOUNTER — Other Ambulatory Visit: Payer: Self-pay | Admitting: Family Medicine

## 2023-12-04 DIAGNOSIS — R0602 Shortness of breath: Secondary | ICD-10-CM | POA: Insufficient documentation

## 2023-12-04 LAB — LIPID PANEL
Chol/HDL Ratio: 4.1 ratio (ref 0.0–4.4)
Cholesterol, Total: 114 mg/dL (ref 100–199)
HDL: 28 mg/dL — ABNORMAL LOW (ref >39)
LDL Chol Calc (NIH): 51 mg/dL (ref 0–99)
Triglycerides: 214 mg/dL — ABNORMAL HIGH (ref 0–149)
VLDL Cholesterol Cal: 35 mg/dL (ref 5–40)

## 2023-12-04 LAB — TSH+FREE T4
Free T4: 1.06 ng/dL (ref 0.82–1.77)
TSH: 1.64 u[IU]/mL (ref 0.450–4.500)

## 2023-12-04 LAB — BMP8+EGFR
BUN/Creatinine Ratio: 10 (ref 9–23)
BUN: 10 mg/dL (ref 6–24)
CO2: 21 mmol/L (ref 20–29)
Calcium: 9.4 mg/dL (ref 8.7–10.2)
Chloride: 101 mmol/L (ref 96–106)
Creatinine, Ser: 0.97 mg/dL (ref 0.57–1.00)
Glucose: 68 mg/dL — ABNORMAL LOW (ref 70–99)
Potassium: 4.8 mmol/L (ref 3.5–5.2)
Sodium: 136 mmol/L (ref 134–144)
eGFR: 72 mL/min/{1.73_m2} (ref >59)

## 2023-12-04 LAB — HEMOGLOBIN A1C
Est. average glucose Bld gHb Est-mCnc: 117 mg/dL
Hgb A1c MFr Bld: 5.7 % — ABNORMAL HIGH (ref 4.8–5.6)

## 2023-12-04 LAB — CBC WITH DIFFERENTIAL/PLATELET
Basophils Absolute: 0.1 10*3/uL (ref 0.0–0.2)
Basos: 1 %
EOS (ABSOLUTE): 0.3 10*3/uL (ref 0.0–0.4)
Eos: 2 %
Hematocrit: 41.3 % (ref 34.0–46.6)
Hemoglobin: 14.2 g/dL (ref 11.1–15.9)
Immature Grans (Abs): 0 10*3/uL (ref 0.0–0.1)
Immature Granulocytes: 0 %
Lymphocytes Absolute: 3 10*3/uL (ref 0.7–3.1)
Lymphs: 27 %
MCH: 33.8 pg — ABNORMAL HIGH (ref 26.6–33.0)
MCHC: 34.4 g/dL (ref 31.5–35.7)
MCV: 98 fL — ABNORMAL HIGH (ref 79–97)
Monocytes Absolute: 0.7 10*3/uL (ref 0.1–0.9)
Monocytes: 6 %
Neutrophils Absolute: 6.8 10*3/uL (ref 1.4–7.0)
Neutrophils: 64 %
Platelets: 294 10*3/uL (ref 150–450)
RBC: 4.2 x10E6/uL (ref 3.77–5.28)
RDW: 13 % (ref 11.7–15.4)
WBC: 10.9 10*3/uL — ABNORMAL HIGH (ref 3.4–10.8)

## 2023-12-04 LAB — VITAMIN D 25 HYDROXY (VIT D DEFICIENCY, FRACTURES): Vit D, 25-Hydroxy: 34.7 ng/mL (ref 30.0–100.0)

## 2023-12-04 MED ORDER — AZITHROMYCIN 250 MG PO TABS: ORAL_TABLET | ORAL | 0 refills | Status: DC

## 2023-12-04 NOTE — Telephone Encounter (Signed)
 Pharmacy Patient Advocate Encounter   Received notification from CoverMyMeds that prior authorization for Breyna 80-4.5MCG/ACT aerosol is required/requested.   Insurance verification completed.   The patient is insured through [facility] .   Per test claim:  BRAND NAME SYMBICORT is preferred by the insurance.  If suggested medication is appropriate, Please send in a new RX and discontinue this one. If not, please advise as to why it's not appropriate so that we may request a Prior Authorization. Please note, some preferred medications may still require a PA.  If the suggested medications have not been trialed and there are no contraindications to their use, the PA will not be submitted, as it will not be approved.  New Rx not needed. These are interchangeable at the pharmacy and [facility] filled name brand on 12/03/2023. Next refill payable 12/26/2023.

## 2023-12-17 ENCOUNTER — Telehealth: Payer: Self-pay | Admitting: Physician Assistant

## 2023-12-17 NOTE — Telephone Encounter (Signed)
 DOS: 11/10/23, C5-7 arthroplasty   Refill of neurontin  sent to pharmacy, please let her know.

## 2023-12-17 NOTE — Telephone Encounter (Signed)
Patient confirmed refill

## 2023-12-18 ENCOUNTER — Other Ambulatory Visit: Payer: Self-pay

## 2023-12-18 DIAGNOSIS — M5010 Cervical disc disorder with radiculopathy, unspecified cervical region: Secondary | ICD-10-CM

## 2023-12-19 ENCOUNTER — Other Ambulatory Visit (INDEPENDENT_AMBULATORY_CARE_PROVIDER_SITE_OTHER): Payer: Self-pay | Admitting: Gastroenterology

## 2023-12-21 ENCOUNTER — Ambulatory Visit (INDEPENDENT_AMBULATORY_CARE_PROVIDER_SITE_OTHER): Admitting: Neurosurgery

## 2023-12-21 ENCOUNTER — Encounter: Payer: Self-pay | Admitting: Neurosurgery

## 2023-12-21 ENCOUNTER — Ambulatory Visit
Admission: RE | Admit: 2023-12-21 | Discharge: 2023-12-21 | Disposition: A | Discharge status: Home / Self Care | Attending: Neurosurgery | Admitting: Neurosurgery

## 2023-12-21 ENCOUNTER — Ambulatory Visit
Admission: RE | Admit: 2023-12-21 | Discharge: 2023-12-21 | Disposition: A | Discharge status: Home / Self Care | Source: Ambulatory Visit | Attending: Neurosurgery | Admitting: Neurosurgery

## 2023-12-21 VITALS — BP 130/80 | Temp 98.0°F | Ht 62.0 in | Wt 222.0 lb

## 2023-12-21 DIAGNOSIS — Z4789 Encounter for other orthopedic aftercare: Secondary | ICD-10-CM | POA: Diagnosis not present

## 2023-12-21 DIAGNOSIS — M5010 Cervical disc disorder with radiculopathy, unspecified cervical region: Secondary | ICD-10-CM

## 2023-12-21 DIAGNOSIS — M50022 Cervical disc disorder at C5-C6 level with myelopathy: Secondary | ICD-10-CM

## 2023-12-21 DIAGNOSIS — Z09 Encounter for follow-up examination after completed treatment for conditions other than malignant neoplasm: Secondary | ICD-10-CM

## 2023-12-21 NOTE — Progress Notes (Signed)
   REFERRING PHYSICIAN:  Del Abron Abt, Fnp [phone] [address]. [address] [place],  [place] [address]  DOS: 11/10/23, C5-7 arthroplasty  HISTORY OF PRESENT ILLNESS: Samauri B Larue is 6 weeks status post C5-7 arthroplasty. Overall, she is doing well.  Some of her numbness and tingling has improved.  She does continue to have aching and pain in her shoulders which is fluctuant.  Her voice has been fluctuating as well but she is able to speak normally for at least part of the day.  PHYSICAL EXAMINATION:  NEUROLOGICAL:  General: In no acute distress.   Awake, alert, oriented to person, place, and time.  Pupils equal round and reactive to light.  Facial tone is symmetric.    Strength:  Side Biceps Triceps Deltoid Interossei Grip Wrist Ext. Wrist Flex.  R 5 5 5 5 5 5 5   L 4+ 4+ 4+ 4+ 4 4 4        Side Iliopsoas Quads Hamstring PF DF EHL  R 5 5 5 5 5 5   L 5 5 5 5  4+ 4+     Incision c/d/I  Imaging:  X-rays have been reviewed, final read is pending.  Overall instrumentation looks to be in good position.  Assessment / Plan: [name] is 6 weeks status post C5-7 arthroplasty.  Neurologically she is doing quite well with improvements in her motor function and sensation.  She is having the expected postoperative neck pain and muscle aching that we often see in reinnervation.  She also has a history of myelopathy which we discussed will require a longer recovery than a disc herniation by itself.  No concerning findings on her physical examination, she has improved motor function.  She does have severe cramping and aching in her shoulders which is likely regenerative as we discussed.  X-rays look good with instrumentation and good location.  Will plan to continue to follow-up.  Will see her at her 12-week appointment.    Carroll Clamp, MD Dept of Neurosurgery

## 2023-12-22 ENCOUNTER — Other Ambulatory Visit: Payer: Self-pay | Admitting: Family Medicine

## 2023-12-22 DIAGNOSIS — I10 Essential (primary) hypertension: Secondary | ICD-10-CM

## 2023-12-24 ENCOUNTER — Ambulatory Visit: Payer: Medicaid Other | Admitting: Urology

## 2023-12-24 ENCOUNTER — Other Ambulatory Visit: Payer: Self-pay | Admitting: Family Medicine

## 2023-12-24 DIAGNOSIS — F321 Major depressive disorder, single episode, moderate: Secondary | ICD-10-CM

## 2023-12-28 ENCOUNTER — Encounter: Payer: Self-pay | Admitting: Neurosurgery

## 2023-12-28 ENCOUNTER — Other Ambulatory Visit: Payer: Self-pay | Admitting: Physician Assistant

## 2023-12-28 ENCOUNTER — Telehealth: Payer: Self-pay | Admitting: Neurosurgery

## 2023-12-28 ENCOUNTER — Ambulatory Visit: Payer: Self-pay | Admitting: Neurosurgery

## 2023-12-28 MED ORDER — OXYCODONE HCL 5 MG PO TABS: 5.0000 mg | ORAL_TABLET | Freq: Four times a day (QID) | ORAL | 0 refills | Status: DC | PRN

## 2023-12-28 NOTE — Telephone Encounter (Signed)
 Patient notified

## 2023-12-28 NOTE — Telephone Encounter (Signed)
 Prescription Request  12/28/2023  LOV: 12/21/2023 // Post Op c5-7 Arthroplasty on 11/10/23  What is the name of the medication or equipment? oxyCODONE  (OXY IR/ROXICODONE ) 5 MG immediate release tablet   Have you contacted your pharmacy to request a refill? No   Which pharmacy would you like this sent to?  [facility] [address], [place] - [address] [place] [address] [place] [address] Phone: [phone] Fax: [phone]  [facility] [address] [place] [address] Phone: [phone] Fax: [phone]    Patient notified that their request is being sent to the clinical staff for review and that they should receive a response within 2 business days.   Please advise at [facility] [phone]

## 2024-01-01 ENCOUNTER — Telehealth: Payer: Self-pay | Admitting: Pharmacy Technician

## 2024-01-01 ENCOUNTER — Other Ambulatory Visit (HOSPITAL_COMMUNITY): Payer: Self-pay

## 2024-01-01 NOTE — Telephone Encounter (Signed)
 Pharmacy Patient Advocate Encounter   Received notification from CoverMyMeds that prior authorization for Budesonide -Formoterol  Fumarate 80-4.5MCG/ACT aerosol is required/requested.   Insurance verification completed.   The patient is insured through [facility] .   Per test claim:  BRAND NAME SYMBICORT  is preferred by the insurance.  If suggested medication is appropriate, Please send in a new RX and discontinue this one. If not, please advise as to why it's not appropriate so that we may request a Prior Authorization. Please note, some preferred medications may still require a PA.  If the suggested medications have not been trialed and there are no contraindications to their use, the PA will not be submitted, as it will not be approved.  New prescription is not needed. Spoke to pharmacy and they can process claim as brand preferred.

## 2024-01-05 NOTE — Progress Notes (Unsigned)
 Name: [name] DOB: 12-Mar-1976 MRN: [id number]  History of Present Illness: [name] is a 48 y.o. female who presents today for follow up visit at [facility].  Relevant History includes: 1. OAB with urinary frequency, nocturia, urgency, urge incontinence, and nocturnal enuresis. - No response to Oxybutynin  XL 15 mg daily.  - Currently taking Oxybutynin  XR 20 mg nightly.  - Reports history of obstructive sleep apnea; states CPAP was not advised at the time of her sleep study.  2. Stress urinary incontinence. 3. Microscopic hematuria (negative workup). - 04/20/2023: CT hematuria protocol showed no acute GU findings - no GU stones, masses, or hydronephrosis.  -04/23/2023: Cystoscopy unremarkable.  At last visit on 06/25/2023: - OAB improved with Oxybutynin  XL 20 mg daily and decreased caffeine intake. - Stress urinary incontinence: She elected expectant management.    Today: She reports that she had neck surgery about 8 weeks ago; denies any significant change in her urination postop.  She reports that her urinary symptoms continue to be bothersome - having persistent urinary frequency, nocturia x1-2, urgency, and urge incontinence (mostly at night). Wears pads.   Denies significant caffeine intake; drinks 1 coffee in the mornings and otherwise mostly drinking water .   She reports that with taking Oxybutynin  XR 20 mg nightly she is having dry mouth, increased constipation, intermittent urinary stream, and some urinary hesitancy. Doing double voiding.    Medications: Current Outpatient Medications  Medication Sig Dispense Refill   Vibegron (GEMTESA) 75 MG TABS Take 1 tablet (75 mg total) by mouth daily.     albuterol  (VENTOLIN  HFA) 108 (90 Base) MCG/ACT inhaler Inhale 2 puffs into the lungs every 6 (six) hours as needed for wheezing or shortness of breath. 8 g 2   amLODipine  (NORVASC ) 10 MG tablet Take 1 tablet by mouth once daily 90 tablet 0    budesonide -formoterol  (SYMBICORT ) 80-4.5 MCG/ACT inhaler Inhale 2 puffs into the lungs 2 (two) times daily. 1 each 3   Cholecalciferol  (VITAMIN D ) 125 MCG (5000 UT) CAPS Take 5,000 Units by mouth daily.     cyclobenzaprine  (FLEXERIL ) 10 MG tablet Take 1 tablet (10 mg total) by mouth 3 (three) times daily as needed for muscle spasms. 90 tablet 0   escitalopram  (LEXAPRO ) 10 MG tablet Take 1 tablet by mouth once daily 30 tablet 0   gabapentin  (NEURONTIN ) 300 MG capsule TAKE 2 CAPSULES BY MOUTH THREE TIMES DAILY 180 capsule 0   linaclotide  (LINZESS ) 145 MCG CAPS capsule Take 1 capsule (145 mcg total) by mouth daily. (Patient taking differently: Take 145 mcg by mouth every morning.) 30 capsule 5   metoprolol  tartrate (LOPRESSOR ) 50 MG tablet Take 1 tablet by mouth twice daily 90 tablet 0   Omega-3 Fatty Acids (FISH OIL ) 1000 MG CAPS Take 2 capsules (2,000 mg total) by mouth in the morning and at bedtime. 90 capsule 3   omeprazole  (PRILOSEC) 40 MG capsule Take 1 capsule (40 mg total) by mouth daily. 60 capsule 3   ondansetron  (ZOFRAN ) 4 MG tablet Take 1 tablet (4 mg total) by mouth every 8 (eight) hours as needed for nausea or vomiting. (Patient taking differently: Take 4 mg by mouth 2 (two) times daily as needed for nausea or vomiting.) 180 tablet 1   oxyCODONE  (OXY IR/ROXICODONE ) 5 MG immediate release tablet Take 1 tablet (5 mg total) by mouth every 6 (six) hours as needed for severe pain (pain score 7-10). 30 tablet 0   rosuvastatin  (CRESTOR ) 5 MG tablet  Take 1 tablet (5 mg total) by mouth daily. (Patient taking differently: Take 5 mg by mouth every morning.) 90 tablet 3   senna (SENOKOT) 8.6 MG TABS tablet Take 1 tablet (8.6 mg total) by mouth 2 (two) times daily. 120 tablet 0   vitamin B-12 (CYANOCOBALAMIN ) 1000 MCG tablet Take 1 tablet (1,000 mcg total) by mouth daily. 60 tablet 1   No current facility-administered medications for this visit.    Allergies: Allergies  Allergen Reactions   Other      laughing gas= skin feels like it burning    Past Medical History:  Diagnosis Date   Anxiety    Phreesia 12/30/2019   Asthma    Depression    Dyspnea    GERD (gastroesophageal reflux disease)    Hidradenitis suppurativa    History of hiatal hernia    Hyperlipidemia    Hypertension    IBS (irritable bowel syndrome)    Intervertebral disc disorder with myelopathy, cervical region    OAB (overactive bladder)    Tobacco use    Vitamin B 12 deficiency    Vitamin D  deficiency    Past Surgical History:  Procedure Laterality Date   BIOPSY  05/22/2022   Procedure: BIOPSY;  Surgeon: Eartha Angelia Sieving, MD;  [facility];  Service: Gastroenterology;;   BIOPSY  11/26/2022   Procedure: BIOPSY;  Surgeon: Eartha Angelia Sieving, MD;  [facility];  Service: Gastroenterology;;   CERVICAL ANTERIOR DISC ARTHROPLASTY, 2 LEVEL N/A 11/10/2023   Procedure: CERVICAL ANTERIOR DISC ARTHROPLASTY, 2 LEVEL;  Surgeon: Claudene Penne ORN, MD;  Location: [facility];  Service: Neurosurgery;  Laterality: N/A;   CERVICAL DISC ARTHROPLASTY  11/10/2023   Procedure: CERVICAL ANTERIOR DISC ARTHROPLASTY Levels 5-7;  Surgeon: Claudene Penne ORN, MD;  Location: [facility];  Service: Neurosurgery;;   CHOLECYSTECTOMY     COLONOSCOPY WITH PROPOFOL  N/A 05/22/2022   Procedure: COLONOSCOPY WITH PROPOFOL ;  Surgeon: Eartha Angelia Sieving, MD;  [facility];  Service: Gastroenterology;  Laterality: N/A;  1215 ASA 2   ESOPHAGOGASTRODUODENOSCOPY (EGD) WITH PROPOFOL  N/A 11/26/2022   Procedure: ESOPHAGOGASTRODUODENOSCOPY (EGD) WITH PROPOFOL ;  Surgeon: Eartha Angelia Sieving, MD;  [facility]: [facility];  Service: Gastroenterology;  Laterality: N/A;  1:00PM;asa 2   POLYPECTOMY  05/22/2022   Procedure: POLYPECTOMY;  Surgeon: Eartha Angelia, Sieving, MD;  [facility]: [facility];  Service: Gastroenterology;;   TUBAL LIGATION     Family History  Problem Relation Age of Onset    Hypertension Father    Hypercholesterolemia Father    COPD Mother    Emphysema Mother    Social History   Socioeconomic History   Marital status: Legally Separated    Spouse name: Not on file   Number of children: Not on file   Years of education: Not on file   Highest education level: GED or equivalent  Occupational History   Not on file  Tobacco Use   Smoking status: Every Day    Current packs/day: 1.50    Average packs/day: 1.5 packs/day for 34.0 years (51.0 ttl pk-yrs)    Types: Cigarettes    Passive exposure: Current   Smokeless tobacco: Never  Vaping Use   Vaping status: Never Used  Substance and Sexual Activity   Alcohol use: Not Currently   Drug use: No   Sexual activity: Yes    Birth control/protection: Surgical  Other Topics Concern   Not on file  Social History Narrative   Not on file  Social Drivers of Corporate investment banker Strain: Low Risk  (08/04/2023)   Overall Financial Resource Strain (CARDIA)    Difficulty of Paying Living Expenses: Not very hard  Food Insecurity: No Food Insecurity (11/10/2023)   Hunger Vital Sign    Worried About Running Out of Food in the Last Year: Never true    Ran Out of Food in the Last Year: Never true  Transportation Needs: No Transportation Needs (11/10/2023)   PRAPARE - Administrator, Civil Service (Medical): No    Lack of Transportation (Non-Medical): No  Physical Activity: Insufficiently Active (08/04/2023)   Exercise Vital Sign    Days of Exercise per Week: 3 days    Minutes of Exercise per Session: 30 min  Stress: Stress Concern Present (08/04/2023)   [employer] of Occupational Health - Occupational Stress Questionnaire    Feeling of Stress : Rather much  Social Connections: Unknown (08/04/2023)   Social Connection and Isolation Panel    Frequency of Communication with Friends and Family: Twice a week    Frequency of Social Gatherings with Friends and Family: Patient declined    Attends  Religious Services: Never    Database administrator or Organizations: No    Attends Engineer, structural: Not on file    Marital Status: Separated  Intimate Partner Violence: Not At Risk (11/10/2023)   Humiliation, Afraid, Rape, and Kick questionnaire    Fear of Current or Ex-Partner: No    Emotionally Abused: No    Physically Abused: No    Sexually Abused: No    Review of Systems Constitutional: Patient denies any unintentional weight loss or change in strength lntegumentary: Patient denies any rashes or pruritus ENT: Patient reports dry mouth Cardiovascular: Patient denies chest pain or syncope Respiratory: Patient denies shortness of breath Gastrointestinal: Patient reports constipation Musculoskeletal: Patient denies muscle cramps or weakness Neurologic: Patient denies convulsions or seizures Allergic/Immunologic: Patient denies recent allergic reaction(s) Hematologic/Lymphatic: Patient denies bleeding tendencies Endocrine: Patient denies heat/cold intolerance  GU: As per HPI.  OBJECTIVE Vitals:   01/07/24 0939  BP: 123/83  Pulse: (!) 102  Temp: 98 F (36.7 C)   There is no height or weight on file to calculate BMI.  Physical Examination Constitutional: No obvious distress; patient is non-toxic appearing  Cardiovascular: No visible lower extremity edema.  Respiratory: The patient does not have audible wheezing/stridor; respirations do not appear labored  Gastrointestinal: Abdomen non-distended Musculoskeletal: Normal ROM of UEs  Skin: No obvious rashes/open sores  Neurologic: CN 2-12 grossly intact Psychiatric: Answered questions appropriately with normal affect  Hematologic/Lymphatic/Immunologic: No obvious bruises or sites of spontaneous bleeding  UA: negative  PVR: 2 ml  ASSESSMENT Overactive bladder - Plan: Urinalysis, Routine w reflex microscopic, BLADDER SCAN AMB NON-IMAGING, Vibegron (GEMTESA) 75 MG TABS  Mixed stress and urge urinary  incontinence - Plan: Vibegron (GEMTESA) 75 MG TABS  Caffeine use  Irritable bowel syndrome with constipation  We discussed the symptoms of overactive bladder (OAB), which include urinary urgency, frequency, nocturia, with or without urge incontinence.  - Patient's neurogenic risk factors: nicotine use, spinal problems.  - Patient's exacerbating factors include: caffeine intake.  We discussed the following management options in detail including potential benefits, risks, and side effects: Behavioral therapy: Modify fluid intake Minimize / avoid bladder irritants (such as caffeine, spicy foods, acidic foods, alcohol) Bladder retraining / timed voiding Double voiding Medication(s): - Failed anticholinergic medications (Oxybutynin ) - ineffective and caused adverse side effects (dry mouth,  constipation, voiding dysfunction).  - Beta-3 agonist medications: We discussed potential side effects of beta-3 agonist medications such as urinary retention and (infrequently) elevated blood pressure.  3. For refractory cases: PTNS (posterior tibial nerve stimulation) Sacral neuromodulation trial (Medtronic lnterStim or Axonics implant) Bladder Botox injections  She decided to proceed with Gemtesa (Vibegron) 75 mg daily.  Will plan for follow up in 6 weeks or sooner if needed. Pt verbalized understanding and agreement. All questions were answered.  PLAN Advised the following: 1. Discontinue Oxybutynin .  2. Start Gemtesa (Vibegron) 75 mg daily.  3. Continue to minimize / avoid caffeine intake. 4. Continue timed voiding. 5. Continue double voiding as needed. 6. Return in about 6 weeks (around 02/18/2024) for OAB, with UA & PVR.  Orders Placed This Encounter  Procedures   Urinalysis, Routine w reflex microscopic   BLADDER SCAN AMB NON-IMAGING    It has been explained that the patient is to follow regularly with their PCP in addition to all other providers involved in their care and to follow  instructions provided by these respective offices. Patient advised to contact urology clinic if any urologic-pertaining questions, concerns, new symptoms or problems arise in the interim period.  Patient Instructions  Overactive bladder (OAB) overview for patients:  Symptoms may include: urinary urgency (gotta go feeling) urinary frequency (voiding >8 times per day) night time urination (nocturia) urge incontinence of urine (UUI)  While we do not know the exact etiology of OAB, several treatment options exist including:  Behavioral therapy: Reducing fluid intake Decreasing bladder stimulants (such as caffeine) and irritants (such as acidic food, spicy foods, alcohol) Urge suppression strategies Bladder retraining via timed voiding  Pelvic floor physical therapy  Medication(s) - can use one or both of the drug classes below. Anticholinergic / antimuscarinic medications:  Mechanism of action: Activate M3 receptors to reduce detrusor stimulation and increase bladder capacity   (parasympathetic nervous system). Effect: Relaxes the bladder to decrease overactivity, increase bladder storage capacity, and increase time between voids. Onset: Slow acting (may take 8-12 weeks to determine efficacy). Medications include: Vesicare (Solifenacin), Ditropan  (Oxybutynin ), Detrol (Tolterodine), Toviaz (Fesoterodine), Sanctura (Trospium), Urispas (Flavoxate), Enablex (Darifenacin), Bentyl  (Dicyclomine ), Levsin  (Hyoscyamine  ). Potential side effects include but are not limited to: Dry eyes, dry mouth, constipation, cognitive impairment, dementia risk with long term use, and urinary retention/ incomplete bladder emptying. Insurance companies generally prefer for patients to try 1-2 anticholinergic / antimuscarinic medications first due to low cost. Some exceptions are made based on patient-specific comorbidities / risk factors. Beta-3 agonist medications: Mechanism of action: Stimulates selective B3  adrenergic receptors to cause smooth muscle bladder relaxation (sympathetic nervous system). Effect: Relaxes the bladder to decrease overactivity, increase bladder storage capacity, and increase time between voids. Onset: Slow acting (may take 8-12 weeks to determine efficacy). Medications include: Myrbetriq  (Mirabegron ) and Vibegron (Gemtesa). Potential side effects include but are not limited to: urinary retention / incomplete bladder emptying and elevated blood pressure (more likely to occur in individuals with pre-existing uncontrolled hypertension). These medications tend to be more expensive than the anticholinergic / antimuscarinic medications.   For patients with refractory OAB (if the above treatment options have been unsuccessful): Posterior tibial nerve stimulation (PTNS). Small acupuncture-type needle inserted near ankle with electric current to stimulate bladder via posterior tibial nerve pathway. Initially requires 12 weekly in-office treatments lasting 30 minutes each; followed by monthly in-office treatments lasting 30 minutes each for 1 year.  Bladder Botox injections. How it is done: Typically done via in-office cystoscopy;  sometimes done in the OR depending on the situation. The bladder is numbed with lidocaine  instilled via a catheter. Then the urologist injects Botox into the bladder muscle wall in about 20 locations. Causes local paralysis of the bladder muscle at the injection sites to reduce bladder muscle overactivity / spasms. The effect lasts for approximately 6 months and cannot be reversed once performed. Risks may included but are not limited to: infection, incomplete bladder emptying/ urinary retention, short term need for self-catheterization or indwelling catheter, and need for repeat therapy. There is a 5-12% chance of needing to catheterize with Botox - that usually resolves in a few months as the Botox wears off. Typically Botox injections would need to be repeated  every 3-12 months since this is not a permanent therapy.  Sacral neuromodulation trial (Medtronic lnterStim or Axonics implant). Sacral neuromodulation is FDA-approved for uncontrolled urinary urgency, urinary frequency, urinary urge incontinence, non-obstructive urinary retention, or fecal incontinence. It is not FDA-approved as a treatment for pain. The goal of this therapy is at least a 50% improvement in symptoms. It is NOT realistic to expect a 100% cure. This is a a 2-step outpatient procedure. After a successful test period, a permanent wire and generator are placed in the OR. We discussed the risk of infection. We reviewed the fact that about 30% of patients fail the test phase and are not candidates for permanent generator placement. During the 1-2 week trial phase, symptoms are documented by the patient to determine response. If patient gets at least a 50% improvement in symptoms, they may then proceed with Step 2. Step 1: Trial lead placement. Per physician discretion, may done one of two ways: Percutaneous nerve evaluation (PNE) in the [facility] urology office. Performed by urologist under local anesthesia (numbing the area with lidocaine ) using a spinal needle for placement of test wire, which usually stays in place for 5-7 days to determine therapy response. Test lead placement in OR under anesthesia. Usually stays in place 2 weeks to determine therapy response. > Step 2: Permanent implantation of sacral neuromodulation device, which is performed in the OR.  Sacral neuromodulation implants: All are conditionally MRI safe. Manufacturer: Medtronic Website: [employer]gl therapy/right-for-you.html Options: lnterStim X: Non-rechargeable. The battery lasts 10 years on average. lnterStim Micro: Rechargeable. The battery lasts 15 years on average and must be charged routinely. Approximately 50% smaller implant than  lnterStim X implant.  Manufacturer: Axonics Website: Findrealrelief.axonics.com Options: Non-rechargeable (Axonics F15): The battery lasts 15 years on average. Rechargeable (Axonics R20): The battery lasts 20 years on average and must be charged in office for about 1 hour every 6-10 months on average. Approximately 50% smaller implant than Axonics non-rechargeable implant.  Note: Generally the rechargeable devices are only advised for very small or thin patients who may not have sufficient adipose tissue to comfortably overlay the implanted device.  Suprapubic catheter (SP tube) placement. Only done in severely refractory OAB when all other options have failed or are not a viable treatment choice depending on patient factors. Involves placement of a catheter through the lower abdomen into the bladder to continuously drain the bladder into an external collection bag, which patient can then empty at their convenience every few hours. Done via an outpatient surgical procedure in the OR under anesthesia. Risks may included but are not limited to: surgical site pain, infections, skin irritation / breakdown, chronic bacteriuria, symptomatic UTls. The SP tube must stay in place continuously. This is a reversible procedure however - the insertion site will close  if catheter is removed for more than a few hours. The SP tube must be exchanged routinely every 4 weeks to prevent the catheter from becoming clogged with sediment. SP tube exchanges are typically performed at a urology nurse visit or by a home health nurse.         Electronically signed by:  Lauraine JAYSON Oz, FNP   01/07/24    10:58 AM

## 2024-01-06 ENCOUNTER — Other Ambulatory Visit: Payer: Self-pay | Admitting: Family Medicine

## 2024-01-06 DIAGNOSIS — I10 Essential (primary) hypertension: Secondary | ICD-10-CM

## 2024-01-07 ENCOUNTER — Encounter: Payer: Self-pay | Admitting: Urology

## 2024-01-07 ENCOUNTER — Ambulatory Visit: Admitting: Urology

## 2024-01-07 VITALS — BP 123/83 | HR 102 | Temp 98.0°F

## 2024-01-07 DIAGNOSIS — Z789 Other specified health status: Secondary | ICD-10-CM

## 2024-01-07 DIAGNOSIS — N3946 Mixed incontinence: Secondary | ICD-10-CM | POA: Diagnosis not present

## 2024-01-07 DIAGNOSIS — N3281 Overactive bladder: Secondary | ICD-10-CM

## 2024-01-07 DIAGNOSIS — K581 Irritable bowel syndrome with constipation: Secondary | ICD-10-CM | POA: Diagnosis not present

## 2024-01-07 LAB — URINALYSIS, ROUTINE W REFLEX MICROSCOPIC
Bilirubin, UA: NEGATIVE
Glucose, UA: NEGATIVE
Ketones, UA: NEGATIVE
Leukocytes,UA: NEGATIVE
Nitrite, UA: NEGATIVE
Protein,UA: NEGATIVE
RBC, UA: NEGATIVE
Specific Gravity, UA: <1.005 — ABNORMAL LOW (ref 1.005–1.030)
Urobilinogen, Ur: 0.2 mg/dL (ref 0.2–1.0)
pH, UA: 6 (ref 5.0–7.5)

## 2024-01-07 LAB — BLADDER SCAN AMB NON-IMAGING: Scan Result: 2

## 2024-01-07 MED ORDER — GEMTESA 75 MG PO TABS
1.0000 | ORAL_TABLET | Freq: Every day | ORAL | Status: DC
Start: 2024-01-07 — End: 2024-02-18

## 2024-01-07 NOTE — Patient Instructions (Signed)

## 2024-01-07 NOTE — Progress Notes (Signed)
 post void residual=2

## 2024-01-13 ENCOUNTER — Other Ambulatory Visit: Payer: Self-pay | Admitting: Physician Assistant

## 2024-01-13 ENCOUNTER — Telehealth: Payer: Self-pay

## 2024-01-13 ENCOUNTER — Other Ambulatory Visit: Payer: Self-pay | Admitting: Orthopedic Surgery

## 2024-01-13 ENCOUNTER — Telehealth: Payer: Self-pay | Admitting: Physician Assistant

## 2024-01-13 MED ORDER — OXYCODONE HCL 5 MG PO TABS: 5.0000 mg | ORAL_TABLET | Freq: Four times a day (QID) | ORAL | 0 refills | Status: DC | PRN

## 2024-01-13 MED ORDER — CYCLOBENZAPRINE HCL 10 MG PO TABS: 10.0000 mg | ORAL_TABLET | Freq: Three times a day (TID) | ORAL | 0 refills | Status: DC | PRN

## 2024-01-13 NOTE — Telephone Encounter (Signed)
 c5-7 Arthroplasty 11/10/23 Oxycodone  5mg  release tablets as needed, she takes 1 at lunch time if needed but always at bed time. So any where from 2-3 tablets a day Cyclobenzaprine  10mg  3 times daily  [facility]   Post-op 02/01/2024

## 2024-01-13 NOTE — Telephone Encounter (Signed)
 Authorization for Oxycodone  had been sent ot plan on Cover my meds and is currently pending.  PA Case ID #: [id number]  KEY# [id number]

## 2024-01-13 NOTE — Telephone Encounter (Signed)
 Patient aware of pain medication refill and that this is her last refill.

## 2024-01-19 ENCOUNTER — Other Ambulatory Visit: Payer: Self-pay | Admitting: Family Medicine

## 2024-01-19 DIAGNOSIS — F321 Major depressive disorder, single episode, moderate: Secondary | ICD-10-CM

## 2024-01-29 ENCOUNTER — Other Ambulatory Visit: Payer: Self-pay

## 2024-01-29 DIAGNOSIS — M5010 Cervical disc disorder with radiculopathy, unspecified cervical region: Secondary | ICD-10-CM

## 2024-01-29 DIAGNOSIS — M5000 Cervical disc disorder with myelopathy, unspecified cervical region: Secondary | ICD-10-CM

## 2024-01-30 ENCOUNTER — Emergency Department (HOSPITAL_COMMUNITY)
Admission: EM | Admit: 2024-01-30 | Discharge: 2024-01-30 | Disposition: A | Discharge status: Home / Self Care | Attending: Emergency Medicine | Admitting: Emergency Medicine

## 2024-01-30 ENCOUNTER — Emergency Department (HOSPITAL_COMMUNITY)

## 2024-01-30 ENCOUNTER — Other Ambulatory Visit: Payer: Self-pay

## 2024-01-30 ENCOUNTER — Encounter (HOSPITAL_COMMUNITY): Payer: Self-pay | Admitting: *Deleted

## 2024-01-30 DIAGNOSIS — Z7951 Long term (current) use of inhaled steroids: Secondary | ICD-10-CM | POA: Insufficient documentation

## 2024-01-30 DIAGNOSIS — Z79899 Other long term (current) drug therapy: Secondary | ICD-10-CM | POA: Insufficient documentation

## 2024-01-30 DIAGNOSIS — J45909 Unspecified asthma, uncomplicated: Secondary | ICD-10-CM | POA: Diagnosis not present

## 2024-01-30 DIAGNOSIS — I10 Essential (primary) hypertension: Secondary | ICD-10-CM | POA: Insufficient documentation

## 2024-01-30 DIAGNOSIS — F17210 Nicotine dependence, cigarettes, uncomplicated: Secondary | ICD-10-CM | POA: Diagnosis not present

## 2024-01-30 DIAGNOSIS — R600 Localized edema: Secondary | ICD-10-CM | POA: Insufficient documentation

## 2024-01-30 DIAGNOSIS — R918 Other nonspecific abnormal finding of lung field: Secondary | ICD-10-CM | POA: Diagnosis not present

## 2024-01-30 DIAGNOSIS — M7989 Other specified soft tissue disorders: Secondary | ICD-10-CM | POA: Diagnosis present

## 2024-01-30 LAB — CBC WITH DIFFERENTIAL/PLATELET
Abs Immature Granulocytes: 0.05 K/uL (ref 0.00–0.07)
Basophils Absolute: 0.1 K/uL (ref 0.0–0.1)
Basophils Relative: 1 %
Eosinophils Absolute: 0.3 K/uL (ref 0.0–0.5)
Eosinophils Relative: 2 %
HCT: 44.8 % (ref 36.0–46.0)
Hemoglobin: 15.8 g/dL — ABNORMAL HIGH (ref 12.0–15.0)
Immature Granulocytes: 0 %
Lymphocytes Relative: 26 %
Lymphs Abs: 3.2 K/uL (ref 0.7–4.0)
MCH: 33.5 pg (ref 26.0–34.0)
MCHC: 35.3 g/dL (ref 30.0–36.0)
MCV: 95.1 fL (ref 80.0–100.0)
Monocytes Absolute: 0.9 K/uL (ref 0.1–1.0)
Monocytes Relative: 8 %
Neutro Abs: 7.6 K/uL (ref 1.7–7.7)
Neutrophils Relative %: 63 %
Platelets: 343 K/uL (ref 150–400)
RBC: 4.71 MIL/uL (ref 3.87–5.11)
RDW: 12.8 % (ref 11.5–15.5)
WBC: 12.1 K/uL — ABNORMAL HIGH (ref 4.0–10.5)
nRBC: 0 % (ref 0.0–0.2)

## 2024-01-30 LAB — BASIC METABOLIC PANEL WITH GFR
Anion gap: 11 (ref 5–15)
BUN: 10 mg/dL (ref 6–20)
CO2: 24 mmol/L (ref 22–32)
Calcium: 9.5 mg/dL (ref 8.9–10.3)
Chloride: 102 mmol/L (ref 98–111)
Creatinine, Ser: 0.85 mg/dL (ref 0.44–1.00)
GFR, Estimated: >60 mL/min (ref >60)
Glucose, Bld: 101 mg/dL — ABNORMAL HIGH (ref 70–99)
Potassium: 3.8 mmol/L (ref 3.5–5.1)
Sodium: 137 mmol/L (ref 135–145)

## 2024-01-30 LAB — D-DIMER, QUANTITATIVE: D-Dimer, Quant: 0.35 ug{FEU}/mL (ref 0.00–0.50)

## 2024-01-30 LAB — BRAIN NATRIURETIC PEPTIDE: B Natriuretic Peptide: 42 pg/mL (ref 0.0–100.0)

## 2024-01-30 NOTE — ED Provider Notes (Signed)
 [place] EMERGENCY DEPARTMENT AT [facility] Provider Note   CSN: [id number] Arrival date & time: 01/30/24  1521     Patient presents with: No chief complaint on file.   [name] is a 48 y.o. female.   HPI     [name] is a 48 y.o. female with past medical history of GERD, hypertension, asthma who presents to the Emergency Department complaining of bilateral lower leg swelling and swelling to her feet.  She noticed symptoms yesterday.  She has tightness of her legs extending to mid calf area and tenderness along the medial aspect of both lower legs near her ankles.  Pain is worse with palpation and with weightbearing.  She underwent cervical spine surgery in April of this year and has been doing well since her procedure.  No recent injuries, fevers, chills, chest pain or shortness of breath.  No history of PE or DVT.  She is a cigarette smoker, she denies any recent travel or excessive sitting  She does take a calcium  channel blocker   Prior to Admission medications   Medication Sig Start Date End Date Taking? Authorizing Provider  albuterol  (VENTOLIN  HFA) 108 (90 Base) MCG/ACT inhaler Inhale 2 puffs into the lungs every 6 (six) hours as needed for wheezing or shortness of breath. 04/01/23   Del Orbe Polanco, Iliana, FNP  amLODipine  (NORVASC ) 10 MG tablet Take 1 tablet by mouth once daily 12/22/23   Del Orbe Polanco, Iliana, FNP  budesonide -formoterol  (SYMBICORT ) 80-4.5 MCG/ACT inhaler Inhale 2 puffs into the lungs 2 (two) times daily. 12/03/23   Del Wilhelmena Lloyd Sola, FNP  Cholecalciferol  (VITAMIN D ) 125 MCG (5000 UT) CAPS Take 5,000 Units by mouth daily.    [provider]  cyclobenzaprine  (FLEXERIL ) 10 MG tablet Take 1 tablet (10 mg total) by mouth 3 (three) times daily as needed for muscle spasms. 01/13/24   Ulis Bottcher, PA-C  escitalopram  (LEXAPRO ) 10 MG tablet Take 1 tablet by mouth once daily 01/19/24   Del Orbe Polanco, Iliana, FNP   gabapentin  (NEURONTIN ) 300 MG capsule TAKE 2 CAPSULES BY MOUTH THREE TIMES DAILY 01/13/24   Gregory Edsel Ruth, PA  linaclotide  (LINZESS ) 145 MCG CAPS capsule Take 1 capsule (145 mcg total) by mouth daily. Patient taking differently: Take 145 mcg by mouth every morning. 10/14/23   Carlan, Chelsea L, NP  metoprolol  tartrate (LOPRESSOR ) 50 MG tablet Take 1 tablet by mouth twice daily 01/06/24   Del Orbe Polanco, Iliana, FNP  Omega-3 Fatty Acids (FISH OIL ) 1000 MG CAPS Take 2 capsules (2,000 mg total) by mouth in the morning and at bedtime. 04/05/23   Del Wilhelmena Lloyd Sola, FNP  omeprazole  (PRILOSEC) 40 MG capsule Take 1 capsule (40 mg total) by mouth daily. 12/21/23   Carlan, Chelsea L, NP  ondansetron  (ZOFRAN ) 4 MG tablet Take 1 tablet (4 mg total) by mouth every 8 (eight) hours as needed for nausea or vomiting. Patient taking differently: Take 4 mg by mouth 2 (two) times daily as needed for nausea or vomiting. 02/26/23   Carlan, Chelsea L, NP  oxyCODONE  (OXY IR/ROXICODONE ) 5 MG immediate release tablet Take 1 tablet (5 mg total) by mouth every 6 (six) hours as needed for severe pain (pain score 7-10). 01/13/24   Ulis Bottcher, PA-C  rosuvastatin  (CRESTOR ) 5 MG tablet Take 1 tablet (5 mg total) by mouth daily. Patient taking differently: Take 5 mg by mouth every morning. 04/05/23   Del Orbe Polanco, Iliana, FNP  senna [facility])  8.6 MG TABS tablet Take 1 tablet (8.6 mg total) by mouth 2 (two) times daily. 11/11/23   Gregory Edsel Ruth, PA  Vibegron  (GEMTESA ) 75 MG TABS Take 1 tablet (75 mg total) by mouth daily. 01/07/24   Gerldine Lauraine BROCKS, FNP  vitamin B-12 (CYANOCOBALAMIN ) 1000 MCG tablet Take 1 tablet (1,000 mcg total) by mouth daily. 01/06/20   Moishe Chiquita HERO, NP    Allergies: Other    Review of Systems  Constitutional:  Negative for appetite change, chills and fever.  Respiratory:  Negative for shortness of breath.   Cardiovascular:  Negative for chest pain.  Gastrointestinal:  Negative for  abdominal pain, diarrhea, nausea and vomiting.  Musculoskeletal:  Positive for myalgias.       Pain swelling bilateral lower extremities and feet  Skin:  Negative for color change, rash and wound.  Neurological:  Negative for dizziness, weakness and numbness.    Updated Vital Signs BP (!) 123/90 (BP Location: Right Arm)   Pulse (!) 102   Temp 98.4 F (36.9 C)   Resp 20   Ht 5' 2 (1.575 m)   Wt 101.2 kg   LMP 01/20/2024 (Approximate)   SpO2 99%   BMI 40.79 kg/m   Physical Exam Vitals and nursing note reviewed.  Constitutional:      General: She is not in acute distress.    Appearance: Normal appearance. She is not ill-appearing or toxic-appearing.  Cardiovascular:     Rate and Rhythm: Regular rhythm. Tachycardia present.     Pulses: Normal pulses.  Pulmonary:     Effort: Pulmonary effort is normal.     Breath sounds: Normal breath sounds.  Abdominal:     Palpations: Abdomen is soft.     Tenderness: There is no abdominal tenderness.  Musculoskeletal:        General: Tenderness present.     Right lower leg: Edema present.     Left lower leg: Edema present.     Comments: Edema of the bilateral LE's.  Ttp of the medial aspects near the ankles.  No excessive warmth or erythema.  No pitting edema.  No posterior calf pain  Skin:    General: Skin is warm.     Capillary Refill: Capillary refill takes less than 2 seconds.     Findings: No bruising, erythema or rash.  Neurological:     General: No focal deficit present.     Mental Status: She is alert.     Sensory: No sensory deficit.     Motor: No weakness.     (all labs ordered are listed, but only abnormal results are displayed) Labs Reviewed  CBC WITH DIFFERENTIAL/PLATELET - Abnormal; Notable for the following components:      Result Value   WBC 12.1 (*)    Hemoglobin 15.8 (*)    All other components within normal limits  BASIC METABOLIC PANEL WITH GFR - Abnormal; Notable for the following components:   Glucose,  Bld 101 (*)    All other components within normal limits  BRAIN NATRIURETIC PEPTIDE  D-DIMER, QUANTITATIVE    EKG: None  Radiology: DG Chest 2 View Result Date: 01/30/2024 CLINICAL DATA:  Peripheral edema. EXAM: CHEST - 2 VIEW COMPARISON:  X-ray 12/04/2023 FINDINGS: No consolidation, pneumothorax or effusion. No edema. Normal cardiopericardial silhouette. Diffuse interstitial changes are identified, similar to previous and could be chronic. Hardware along the cervical spine at the edge of the imaging field. Degenerative changes along the thoracic spine on lateral view. Surgical  clips in the right upper quadrant of the abdomen. IMPRESSION: Persistent interstitial lung changes. Please correlate for chronic process. No consolidation or effusion. Electronically Signed   By: Ranell Bring M.D.   On: 01/30/2024 16:55     Procedures   Medications Ordered in the ED - No data to display                                  Medical Decision Making Patient here with complaint of bilateral lower extremity edema with tenderness to the medial aspects of both lower legs near the ankles.  History of cervical surgery in April of this year.  No history of PE or DVT but patient concerned about blood clots.  No recent injury or wounds to the lower extremities no reported fever or chills chest pain or shortness of breath.   Mild nonpitting edema of the bilateral lower extremities.  No excessive warmth erythema or open wounds of the lower legs.  She has good palpable dorsalis pedis pulses bilaterally.  Cap refill is also good  Peripheral edema secondary to venous insufficiency, DVT, cellulitis, side effect of her calcium  channel blocker and CHF all considered.  Clinically I have low suspicion for DVT considering edema is bilateral and she does not have any posterior calf tenderness.  Amount and/or Complexity of Data Reviewed Labs: ordered.    Details: No significant leukocytosis and remaining labs  unremarkable Radiology: ordered.    Details: Chest x-ray without evidence of consolidation or effusion. ECG/medicine tests: ordered.    Details: EKG sinus rhythm with left atrial enlargement Discussion of management or test interpretation with external provider(s): Workup without findings for CHF and clinical suspicion for DVT is low.  The findings suggestive of a cellulitis. Because of patient's peripheral edema possibly related to calcium  channel blocker versus mild venous insufficiency.  Patient is a cigarette smoker.  Her blood pressure is well-controlled with amlodipine  and metoprolol .  Do not feel that changing her antihypertensive medications is warranted at this time.  She agrees to elevate her legs when possible and she has compression stockings at home to wear.  Advised to close outpatient follow-up and she was given strict return precautions.        Final diagnoses:  Peripheral edema    ED Discharge Orders     None          [name] 01/30/24 Bridget Suzette Pac, MD 02/02/24 1213

## 2024-01-30 NOTE — Discharge Instructions (Signed)
 Elevate your legs as much as possible.  I would recommend that you also wear your compression stockings.  Please call your primary care provider on Monday to arrange follow-up appointment.  Return to the emergency department if you develop any new or worsening symptoms.

## 2024-01-30 NOTE — ED Triage Notes (Signed)
 Pt bilateral feet and ankle swelling since yesterday.

## 2024-02-01 ENCOUNTER — Ambulatory Visit
Admission: RE | Admit: 2024-02-01 | Discharge: 2024-02-01 | Disposition: A | Discharge status: Home / Self Care | Source: Ambulatory Visit | Attending: Physician Assistant | Admitting: Physician Assistant

## 2024-02-01 ENCOUNTER — Ambulatory Visit (INDEPENDENT_AMBULATORY_CARE_PROVIDER_SITE_OTHER): Admitting: Physician Assistant

## 2024-02-01 ENCOUNTER — Ambulatory Visit
Admission: RE | Admit: 2024-02-01 | Discharge: 2024-02-01 | Disposition: A | Discharge status: Home / Self Care | Attending: *Deleted | Admitting: *Deleted

## 2024-02-01 ENCOUNTER — Encounter: Payer: Self-pay | Admitting: Physician Assistant

## 2024-02-01 VITALS — BP 122/88 | Temp 99.6°F | Ht 62.0 in | Wt 220.0 lb

## 2024-02-01 DIAGNOSIS — M5000 Cervical disc disorder with myelopathy, unspecified cervical region: Secondary | ICD-10-CM

## 2024-02-01 DIAGNOSIS — Z981 Arthrodesis status: Secondary | ICD-10-CM | POA: Diagnosis not present

## 2024-02-01 DIAGNOSIS — Z09 Encounter for follow-up examination after completed treatment for conditions other than malignant neoplasm: Secondary | ICD-10-CM

## 2024-02-01 DIAGNOSIS — J849 Interstitial pulmonary disease, unspecified: Secondary | ICD-10-CM

## 2024-02-01 DIAGNOSIS — M5010 Cervical disc disorder with radiculopathy, unspecified cervical region: Secondary | ICD-10-CM | POA: Insufficient documentation

## 2024-02-01 DIAGNOSIS — R29898 Other symptoms and signs involving the musculoskeletal system: Secondary | ICD-10-CM

## 2024-02-01 DIAGNOSIS — M545 Low back pain, unspecified: Secondary | ICD-10-CM

## 2024-02-01 DIAGNOSIS — M50022 Cervical disc disorder at C5-C6 level with myelopathy: Secondary | ICD-10-CM

## 2024-02-01 DIAGNOSIS — R200 Anesthesia of skin: Secondary | ICD-10-CM

## 2024-02-01 DIAGNOSIS — R202 Paresthesia of skin: Secondary | ICD-10-CM

## 2024-02-01 NOTE — Progress Notes (Signed)
   REFERRING PHYSICIAN:  Del Wilhelmena Lloyd Sola, Fnp [phone] [address] [place],  [place] [address]  DOS: 11/10/23, C5-7 arthroplasty  HISTORY OF PRESENT ILLNESS: [name] is 11 weeks status post C5-7 arthroplasty. Overall, she is doing well.  States the range of motion of her neck is still limited, however her stiffness has improved somewhat.  She continues to use Flexeril  and gabapentin .  She complains of her right hand going numb specifically when she is writing or doing work around the house.  In addition she is also complaining of back pain.  It prevents her from walking a distance and she often has to lean forward secondary to the pain.   PHYSICAL EXAMINATION:  NEUROLOGICAL:  General: In no acute distress.   Awake, alert, oriented to person, place, and time.  Pupils equal round and reactive to light.  Facial tone is symmetric.     +CTS tests on the right hand.   Strength:  Side Biceps Triceps Deltoid Interossei Grip Wrist Ext. Wrist Flex.  R 5 5 5 5 5 5 5   L 5 5 5  4+ 4 4 4        Side Iliopsoas Quads Hamstring PF DF EHL  R 5 5 5 5 5 5   L 5 5 5 5  4+ 4+     Incision c/d/I  Imaging:  X-rays have been reviewed, final read is pending.  Overall instrumentation looks to be in good position.  Assessment / Plan: [name]  is 11 weeks status post C5-7 arthroplasty. Overall, she is doing well.  States the range of motion of her neck is still limited, however her stiffness has improved somewhat.  She continues to use Flexeril  and gabapentin .  She complains of her right hand going numb specifically when she is writing or doing work around the house.  In addition she is also complaining of back pain.  It prevents her from walking a distance and she often has to lean forward secondary to the pain.X-rays look good with instrumentation and good location.     On examination, strength is improving.  Patient has positive provocative signs of carpal tunnel syndrome.  EMG  ordered to evaluate for this.  Advised patient to use a brace in the meantime for conservative management.  Plan for physical therapy to improve her neck stiffness, hand weakness, back pain.  Referral has been placed.  In addition x-rays of her lumbar spine to be completed today.  Per patient patient has a cough, x-rays reviewed from most recent ED visit showing interstitial lung disease.  Patient does not have a pulmonologist.  Referral for her to establish care has been made.  Will review EMG once complete.  The patient continues to have back pain after physical therapy, encouraged her to follow-up with us  and would consider MRI of her lumbar spine in the future.   Lyle Decamp, PA-C Dept of Neurosurgery

## 2024-02-02 ENCOUNTER — Ambulatory Visit: Payer: Self-pay

## 2024-02-02 ENCOUNTER — Telehealth: Payer: Self-pay | Admitting: Pulmonary Disease

## 2024-02-02 NOTE — Telephone Encounter (Signed)
 FYI Only or Action Required?: FYI only for provider.  New patient appointment for pulmonary.  Called Nurse Triage reporting Shortness of Breath.  Symptoms began several months ago.  Interventions attempted: Rescue inhaler and Maintenance inhaler.  Symptoms are: unchanged.  Triage Disposition: See PCP Within 2 Weeks-patient called to make an appointment with pulmonary as a new patient.   Patient/caregiver understands and will follow disposition?: Yes  Copied from CRM [id number]. Topic: Clinical - Red Word Triage >> Feb 02, 2024  9:48 AM [name] wrote: Red Word that prompted transfer to Nurse Triage: Pt is not established and has a referral for ILD. Pt recently went to ED on 7/19 for peripheral edema in legs and ankles. Pt is calling back today to schedule a new patient visit with Dr. Tamea. Reason for Disposition  [1] MILD longstanding difficulty breathing (e.g., minimal/no SOB at rest, SOB with walking, pulse < 100) AND [2] SAME as normal  Answer Assessment - Initial Assessment Questions 1. RESPIRATORY STATUS: Describe your breathing? (e.g., wheezing, shortness of breath, unable to speak, severe coughing)      Shortness of breath 2. ONSET: When did this breathing problem begin?      Started mid April of 2025 3. PATTERN Does the difficult breathing come and go, or has it been constant since it started?      constant 4. SEVERITY: How bad is your breathing? (e.g., mild, moderate, severe)      Mild-patient reports having used her inhalers this morning and not completing a lot of activity 5. RECURRENT SYMPTOM: Have you had difficulty breathing before? If Yes, ask: When was the last time? and What happened that time?      yes 6. CARDIAC HISTORY: Do you have any history of heart disease? (e.g., heart attack, angina, bypass surgery, angioplasty)      no 7. LUNG HISTORY: Do you have any history of lung disease?  (e.g., pulmonary embolus, asthma, emphysema)     no 8.  CAUSE: What do you think is causing the breathing problem?      unsure 9. OTHER SYMPTOMS: Do you have any other symptoms? (e.g., chest pain, cough, dizziness, fever, runny nose)     cough 10. O2 SATURATION MONITOR:  Do you use an oxygen saturation monitor (pulse oximeter) at home? If Yes, ask: What is your reading (oxygen level) today? What is your usual oxygen saturation reading? (e.g., 95%)       N/A-patient given information of where to find an oxygen saturation monitor. 12. TRAVEL: Have you traveled out of the country in the last month? (e.g., travel history, exposures)       no  Patient calling to make a new patient appointment with Pulmonary. Patient reports shortness of breath with any activity. Patient instructed on follow up with PCP and when to utilize UC/ED. Patient transferred back to agent to be scheduled with pulmonary.  Protocols used: Breathing Difficulty-A-AH

## 2024-02-02 NOTE — Telephone Encounter (Signed)
 LVMTCB to schedule pulmonary consult.

## 2024-02-03 ENCOUNTER — Encounter: Payer: Self-pay | Admitting: Neurology

## 2024-02-03 ENCOUNTER — Other Ambulatory Visit: Payer: Self-pay

## 2024-02-03 ENCOUNTER — Ambulatory Visit
Admission: RE | Admit: 2024-02-03 | Discharge: 2024-02-03 | Disposition: A | Discharge status: Home / Self Care | Attending: Physician Assistant | Admitting: Physician Assistant

## 2024-02-03 ENCOUNTER — Ambulatory Visit
Admission: RE | Admit: 2024-02-03 | Discharge: 2024-02-03 | Disposition: A | Discharge status: Home / Self Care | Source: Ambulatory Visit | Attending: Physician Assistant | Admitting: Physician Assistant

## 2024-02-03 DIAGNOSIS — R202 Paresthesia of skin: Secondary | ICD-10-CM

## 2024-02-03 DIAGNOSIS — M549 Dorsalgia, unspecified: Secondary | ICD-10-CM | POA: Diagnosis not present

## 2024-02-03 DIAGNOSIS — M545 Low back pain, unspecified: Secondary | ICD-10-CM | POA: Insufficient documentation

## 2024-02-03 DIAGNOSIS — I700 Atherosclerosis of aorta: Secondary | ICD-10-CM | POA: Diagnosis not present

## 2024-02-05 ENCOUNTER — Ambulatory Visit: Admitting: Sleep Medicine

## 2024-02-11 ENCOUNTER — Other Ambulatory Visit: Payer: Self-pay | Admitting: Neurosurgery

## 2024-02-12 ENCOUNTER — Encounter: Payer: Self-pay | Admitting: Family Medicine

## 2024-02-12 ENCOUNTER — Other Ambulatory Visit (INDEPENDENT_AMBULATORY_CARE_PROVIDER_SITE_OTHER): Payer: Self-pay | Admitting: Gastroenterology

## 2024-02-12 ENCOUNTER — Ambulatory Visit: Payer: Self-pay | Admitting: Family Medicine

## 2024-02-12 VITALS — BP 126/78 | HR 86 | Ht 62.0 in | Wt 225.0 lb

## 2024-02-12 DIAGNOSIS — R221 Localized swelling, mass and lump, neck: Secondary | ICD-10-CM

## 2024-02-12 DIAGNOSIS — M7989 Other specified soft tissue disorders: Secondary | ICD-10-CM

## 2024-02-12 MED ORDER — FUROSEMIDE 20 MG PO TABS: 20.0000 mg | ORAL_TABLET | ORAL | 3 refills | Status: DC | PRN

## 2024-02-12 MED ORDER — OLMESARTAN MEDOXOMIL 20 MG PO TABS: 10.0000 mg | ORAL_TABLET | Freq: Every day | ORAL | 2 refills | Status: DC

## 2024-02-12 NOTE — Assessment & Plan Note (Addendum)
 Vitals:   02/12/24 0857  BP: 126/78  Amlodipine  10 mg daily was discontinued due to suspected side effects, including bilateral leg swelling. The patient was started on olmesartan 10 mg once daily as an alternative blood pressure medication. A follow-up is planned in two weeks via MyChart- home blood pressure readings for review.  VAS ultrasound of the lower extremities was ordered  Lasix (furosemide) 20 mg was prescribed to be taken as needed for symptom relief.  The patient was advised to follow a low-sodium diet

## 2024-02-12 NOTE — Progress Notes (Signed)
 Established Patient Office Visit   Subjective  Patient ID: [name], female    DOB: [birth year]  Age: 47 y.o. MRN: [id number]  Chief Complaint  Patient presents with   Cyst    Knot on left side of throat    Hoarse    Voice comes and goes   Leg Swelling    Swelling in legs, ankles and feet    She  has a past medical history of Anxiety, Asthma, Depression, Dyspnea, GERD (gastroesophageal reflux disease), Hidradenitis suppurativa, History of hiatal hernia, Hyperlipidemia, Hypertension, IBS (irritable bowel syndrome), Intervertebral disc disorder with myelopathy, cervical region, OAB (overactive bladder), Tobacco use, Vitamin B 12 deficiency, and Vitamin D  deficiency.  Cyst / Knot on Left Side of Throat The patient reports noticing a soft, non-tender knot on the left side of the throat approximately two months ago. There has been no change in size or shape since it first appeared. The mass is described as soft to the touch. The patient denies any recent upper respiratory infections, sore throat, ear infections, fever, night sweats, weight loss, trauma, or insect bites. There is no prior history of similar lumps. However, the patient notes occasional difficulty breathing, describing it as losing breath.Patient states that hoarseness began a few months ago and occurs intermittently. Voice quality worsens after speaking and varies throughout the day. The hoarseness is accompanied by throat pain and irritation. The patient also reports a history of acid reflux and acknowledges current tobacco use. No recent upper respiratory infections or vocal overuse are reported.  Leg Swelling (Ankles/Feet) Bilateral lower extremity swelling began approximately two weeks ago. The swelling occurs at any time of day and is not position-dependent. It improves only with rest and does not respond significantly to elevation. The patient denies redness, warmth, or pain in the legs. However, they do report  shortness of breath. There is no history of recent travel or prolonged immobility. The patient is currently taking amlodipine . No recent weight gain has been observed.    Review of Systems  Constitutional:  Negative for chills and fever.  Eyes:  Negative for blurred vision.  Respiratory:  Positive for shortness of breath.   Cardiovascular:  Positive for claudication and leg swelling. Negative for chest pain.      Objective:     BP 126/78   Pulse 86   Ht 5' 2 (1.575 m)   Wt 225 lb (102.1 kg)   LMP 01/20/2024 (Approximate)   SpO2 95%   BMI 41.15 kg/m  BP Readings from Last 3 Encounters:  02/12/24 126/78  02/01/24 122/88  01/30/24 116/65      Physical Exam Vitals reviewed.  Constitutional:      General: She is not in acute distress.    Appearance: Normal appearance. She is not ill-appearing, toxic-appearing or diaphoretic.  HENT:     Head: Normocephalic.  Eyes:     General:        Right eye: No discharge.        Left eye: No discharge.     Conjunctiva/sclera: Conjunctivae normal.  Cardiovascular:     Rate and Rhythm: Normal rate.     Pulses: Normal pulses.     Heart sounds: Normal heart sounds.  Pulmonary:     Effort: Pulmonary effort is normal. No respiratory distress.     Breath sounds: Normal breath sounds.  Musculoskeletal:        General: No tenderness. Normal range of motion.     Cervical back: Normal range  of motion. Rigidity present.     Right lower leg: Edema present.     Left lower leg: Edema present.  Lymphadenopathy:     Cervical: Cervical adenopathy present.  Skin:    General: Skin is warm and dry.     Capillary Refill: Capillary refill takes less than 2 seconds.  Neurological:     Mental Status: She is alert.  Psychiatric:        Mood and Affect: Mood normal.        Behavior: Behavior normal.      No results found for any visits on 02/12/24.  The ASCVD Risk score (Arnett DK, et al., 2019) failed to calculate for the following  reasons:   The valid total cholesterol range is 130 to 320 mg/dL    Assessment & Plan:  Mass of left side of neck Assessment & Plan: Soft, non-mobile mass on the left side of the neck. Voice is hoarse, accompanied by throat discomfort and painful swallowing (odynophagia). Plan: CT scan of the neck has been ordered for further evaluation of the neck mass and associated symptoms.  Orders: -     CT SOFT TISSUE NECK W CONTRAST; Future  Leg swelling Assessment & Plan: Vitals:   02/12/24 0857  BP: 126/78  Amlodipine  10 mg daily was discontinued due to suspected side effects, including bilateral leg swelling. The patient was started on olmesartan 10 mg once daily as an alternative blood pressure medication. A follow-up is planned in two weeks via MyChart- home blood pressure readings for review.  VAS ultrasound of the lower extremities was ordered  Lasix (furosemide) 20 mg was prescribed to be taken as needed for symptom relief.  The patient was advised to follow a low-sodium diet  Orders: -     VAS US  LOWER EXTREMITY VENOUS REFLUX; Future  Other orders -     Furosemide; Take 1 tablet (20 mg total) by mouth as needed for up to 1 dose for edema or fluid. For Leg swelling  Dispense: 30 tablet; Refill: 3 -     Olmesartan Medoxomil; Take 0.5 tablets (10 mg total) by mouth daily.  Dispense: 30 tablet; Refill: 2    Return if symptoms worsen or fail to improve.   [name], FNP

## 2024-02-12 NOTE — Assessment & Plan Note (Signed)
 Soft, non-mobile mass on the left side of the neck. Voice is hoarse, accompanied by throat discomfort and painful swallowing (odynophagia). Plan: CT scan of the neck has been ordered for further evaluation of the neck mass and associated symptoms.

## 2024-02-12 NOTE — Patient Instructions (Signed)

## 2024-02-16 ENCOUNTER — Other Ambulatory Visit: Payer: Self-pay | Admitting: Physician Assistant

## 2024-02-16 ENCOUNTER — Other Ambulatory Visit: Payer: Self-pay | Admitting: Family Medicine

## 2024-02-16 DIAGNOSIS — I10 Essential (primary) hypertension: Secondary | ICD-10-CM

## 2024-02-16 MED ORDER — CEPHALEXIN 500 MG PO CAPS: 500.0000 mg | ORAL_CAPSULE | Freq: Three times a day (TID) | ORAL | 0 refills | Status: AC

## 2024-02-18 ENCOUNTER — Encounter: Payer: Self-pay | Admitting: Urology

## 2024-02-18 ENCOUNTER — Ambulatory Visit: Admitting: Urology

## 2024-02-18 VITALS — BP 118/80 | HR 90

## 2024-02-18 DIAGNOSIS — N3946 Mixed incontinence: Secondary | ICD-10-CM | POA: Diagnosis not present

## 2024-02-18 DIAGNOSIS — R350 Frequency of micturition: Secondary | ICD-10-CM

## 2024-02-18 DIAGNOSIS — N3281 Overactive bladder: Secondary | ICD-10-CM | POA: Diagnosis not present

## 2024-02-18 DIAGNOSIS — R3915 Urgency of urination: Secondary | ICD-10-CM

## 2024-02-18 LAB — URINALYSIS, ROUTINE W REFLEX MICROSCOPIC
Bilirubin, UA: NEGATIVE
Glucose, UA: NEGATIVE
Ketones, UA: NEGATIVE
Leukocytes,UA: NEGATIVE
Nitrite, UA: NEGATIVE
Protein,UA: NEGATIVE
RBC, UA: NEGATIVE
Specific Gravity, UA: 1.005 (ref 1.005–1.030)
Urobilinogen, Ur: 0.2 mg/dL (ref 0.2–1.0)
pH, UA: 6 (ref 5.0–7.5)

## 2024-02-18 LAB — BLADDER SCAN AMB NON-IMAGING: Scan Result: 33

## 2024-02-18 MED ORDER — GEMTESA 75 MG PO TABS
1.0000 | ORAL_TABLET | Freq: Every day | ORAL | 11 refills | Status: DC
Start: 2024-02-18 — End: 2024-04-25

## 2024-02-18 NOTE — Progress Notes (Signed)
 Name: [name] DOB: 03/15/1976 MRN: [id number]  History of Present Illness: [name] is a 48 y.o. female who presents today for follow up visit at [facility].  Relevant History includes: 1. OAB with urinary frequency, nocturia, urgency, urge incontinence, and nocturnal enuresis. - Denies significant caffeine intake. - Failed Oxybutynin  XL (ineffective & caused adverse side effects of dry mouth, constipation, voiding dysfunction).  - Reports history of obstructive sleep apnea; states CPAP was not advised at the time of her sleep study.  2. Stress urinary incontinence. 3. Microscopic hematuria (negative workup). - 04/20/2023: CT hematuria protocol showed no acute GU findings - no GU stones, masses, or hydronephrosis.  -04/23/2023: Cystoscopy unremarkable.  At last visit on 01/07/2024: - Reported persistent urinary frequency, nocturia, urgency, and urge incontinence. - Oxybutynin  XR 20 mg nightly caused dry mouth, increased constipation, intermittent urinary stream, and some urinary hesitancy.  - The plan was: 1. Discontinue Oxybutynin .  2. Start Gemtesa  (Vibegron ) 75 mg daily.  3. Continue to minimize / avoid caffeine intake. 4. Continue timed voiding. 5. Continue double voiding as needed. 6. Return in about 6 weeks (around 02/18/2024) for OAB, with UA & PVR.  Today: She reports symptomatic improvement since starting Gemtesa  (Vibegron ) 75 mg daily. She reports improved urinary frequency, urgency, and urge incontinence. She is no longer having to wear pads routinely. Her leakage at this time is infrequent and primarily due to stress urinary incontinence when she coughs.  She denies dysuria, gross hematuria, straining to void, or sensations of incomplete emptying.   Medications: Current Outpatient Medications  Medication Sig Dispense Refill   albuterol  (VENTOLIN  HFA) 108 (90 Base) MCG/ACT inhaler Inhale 2 puffs into the lungs every 6 (six) hours as  needed for wheezing or shortness of breath. 8 g 2   budesonide -formoterol  (SYMBICORT ) 80-4.5 MCG/ACT inhaler Inhale 2 puffs into the lungs 2 (two) times daily. 1 each 3   cephALEXin  (KEFLEX ) 500 MG capsule Take 1 capsule (500 mg total) by mouth 3 (three) times daily for 7 days. 21 capsule 0   Cholecalciferol  (VITAMIN D ) 125 MCG (5000 UT) CAPS Take 5,000 Units by mouth daily.     cyclobenzaprine  (FLEXERIL ) 10 MG tablet Take 1 tablet (10 mg total) by mouth 3 (three) times daily as needed for muscle spasms. 90 tablet 0   escitalopram  (LEXAPRO ) 10 MG tablet Take 1 tablet by mouth once daily 30 tablet 0   furosemide  (LASIX ) 20 MG tablet Take 1 tablet (20 mg total) by mouth as needed for up to 1 dose for edema or fluid. For Leg swelling 30 tablet 3   gabapentin  (NEURONTIN ) 300 MG capsule TAKE 2 CAPSULES BY MOUTH THREE TIMES DAILY 180 capsule 0   linaclotide  (LINZESS ) 145 MCG CAPS capsule Take 1 capsule (145 mcg total) by mouth daily. (Patient taking differently: Take 145 mcg by mouth every morning.) 30 capsule 5   metoprolol  tartrate (LOPRESSOR ) 50 MG tablet Take 1 tablet by mouth twice daily 90 tablet 0   olmesartan  (BENICAR ) 20 MG tablet Take 0.5 tablets (10 mg total) by mouth daily. 30 tablet 2   Omega-3 Fatty Acids (FISH OIL ) 1000 MG CAPS Take 2 capsules (2,000 mg total) by mouth in the morning and at bedtime. 90 capsule 3   omeprazole  (PRILOSEC) 40 MG capsule Take 1 capsule (40 mg total) by mouth daily. 60 capsule 3   ondansetron  (ZOFRAN ) 4 MG tablet TAKE 1 TABLET BY MOUTH EVERY 8 HOURS AS NEEDED FOR NAUSEA OR VOMITING  60 tablet 1   oxyCODONE  (OXY IR/ROXICODONE ) 5 MG immediate release tablet Take 1 tablet (5 mg total) by mouth every 6 (six) hours as needed for severe pain (pain score 7-10). 30 tablet 0   rosuvastatin  (CRESTOR ) 5 MG tablet Take 1 tablet (5 mg total) by mouth daily. (Patient taking differently: Take 5 mg by mouth every morning.) 90 tablet 3   senna (SENOKOT) 8.6 MG TABS tablet Take 1  tablet (8.6 mg total) by mouth 2 (two) times daily. 120 tablet 0   vitamin B-12 (CYANOCOBALAMIN ) 1000 MCG tablet Take 1 tablet (1,000 mcg total) by mouth daily. 60 tablet 1   Vibegron  (GEMTESA ) 75 MG TABS Take 1 tablet (75 mg total) by mouth daily. 30 tablet 11   No current facility-administered medications for this visit.    Allergies: Allergies  Allergen Reactions   Other     laughing gas= skin feels like it burning    Past Medical History:  Diagnosis Date   Anxiety    Phreesia 12/30/2019   Asthma    Depression    Dyspnea    GERD (gastroesophageal reflux disease)    Hidradenitis suppurativa    History of hiatal hernia    Hyperlipidemia    Hypertension    IBS (irritable bowel syndrome)    Intervertebral disc disorder with myelopathy, cervical region    OAB (overactive bladder)    Tobacco use    Vitamin B 12 deficiency    Vitamin D  deficiency    Past Surgical History:  Procedure Laterality Date   BIOPSY  05/22/2022   Procedure: BIOPSY;  Surgeon: Eartha Angelia Sieving, MD;  [facility];  Service: Gastroenterology;;   BIOPSY  11/26/2022   Procedure: BIOPSY;  Surgeon: Eartha Angelia Sieving, MD;  [facility];  Service: Gastroenterology;;   CERVICAL ANTERIOR DISC ARTHROPLASTY, 2 LEVEL N/A 11/10/2023   Procedure: CERVICAL ANTERIOR DISC ARTHROPLASTY, 2 LEVEL;  Surgeon: Claudene Penne ORN, MD;  Location: [facility];  Service: Neurosurgery;  Laterality: N/A;   CERVICAL DISC ARTHROPLASTY  11/10/2023   Procedure: CERVICAL ANTERIOR DISC ARTHROPLASTY Levels 5-7;  Surgeon: Claudene Penne ORN, MD;  Location: [facility];  Service: Neurosurgery;;   CHOLECYSTECTOMY     COLONOSCOPY WITH PROPOFOL  N/A 05/22/2022   Procedure: COLONOSCOPY WITH PROPOFOL ;  Surgeon: Eartha Angelia Sieving, MD;  [facility];  Service: Gastroenterology;  Laterality: N/A;  1215 ASA 2   ESOPHAGOGASTRODUODENOSCOPY (EGD) WITH PROPOFOL  N/A 11/26/2022   Procedure:  ESOPHAGOGASTRODUODENOSCOPY (EGD) WITH PROPOFOL ;  Surgeon: Eartha Angelia Sieving, MD;  [facility];  Service: Gastroenterology;  Laterality: N/A;  1:00PM;asa 2   POLYPECTOMY  05/22/2022   Procedure: POLYPECTOMY;  Surgeon: Eartha Angelia, Sieving, MD;  [facility]: [facility];  Service: Gastroenterology;;   TUBAL LIGATION     Family History  Problem Relation Age of Onset   Hypertension Father    Hypercholesterolemia Father    COPD Mother    Emphysema Mother    Social History   Socioeconomic History   Marital status: Legally Separated    Spouse name: Not on file   Number of children: Not on file   Years of education: Not on file   Highest education level: GED or equivalent  Occupational History   Not on file  Tobacco Use   Smoking status: Every Day    Current packs/day: 1.50    Average packs/day: 1.5 packs/day for 34.0 years (51.0 ttl pk-yrs)    Types: Cigarettes    Passive  exposure: Current   Smokeless tobacco: Never  Vaping Use   Vaping status: Never Used  Substance and Sexual Activity   Alcohol use: Not Currently   Drug use: No   Sexual activity: Yes    Birth control/protection: Surgical  Other Topics Concern   Not on file  Social History Narrative   Not on file   Social Drivers of Health   Financial Resource Strain: Medium Risk (02/08/2024)   Overall Financial Resource Strain (CARDIA)    Difficulty of Paying Living Expenses: Somewhat hard  Food Insecurity: Food Insecurity Present (02/08/2024)   Hunger Vital Sign    Worried About Running Out of Food in the Last Year: Sometimes true    Ran Out of Food in the Last Year: Sometimes true  Transportation Needs: No Transportation Needs (02/08/2024)   PRAPARE - Administrator, Civil Service (Medical): No    Lack of Transportation (Non-Medical): No  Physical Activity: Inactive (02/08/2024)   Exercise Vital Sign    Days of Exercise per Week: 1 day    Minutes of Exercise per Session: 0 min   Stress: Stress Concern Present (02/08/2024)   [employer] of Occupational Health - Occupational Stress Questionnaire    Feeling of Stress: Rather much  Social Connections: Socially Isolated (02/08/2024)   Social Connection and Isolation Panel    Frequency of Communication with Friends and Family: Once a week    Frequency of Social Gatherings with Friends and Family: Once a week    Attends Religious Services: Never    Database administrator or Organizations: No    Attends Engineer, structural: Not on file    Marital Status: Separated  Intimate Partner Violence: Not At Risk (11/10/2023)   Humiliation, Afraid, Rape, and Kick questionnaire    Fear of Current or Ex-Partner: No    Emotionally Abused: No    Physically Abused: No    Sexually Abused: No    Review of Systems Constitutional: Patient denies any unintentional weight loss or change in strength lntegumentary: Patient denies any rashes or pruritus Cardiovascular: Patient denies chest pain or syncope Respiratory: Patient denies shortness of breath Gastrointestinal: Patient denies nausea, vomiting, constipation, or diarrhea  Musculoskeletal: Patient denies muscle cramps or weakness Neurologic: Patient denies convulsions or seizures Allergic/Immunologic: Patient denies recent allergic reaction(s) Hematologic/Lymphatic: Patient denies bleeding tendencies Endocrine: Patient denies heat/cold intolerance  GU: As per HPI.  OBJECTIVE Vitals:   02/18/24 1138  BP: 118/80  Pulse: 90   There is no height or weight on file to calculate BMI.  Physical Examination Constitutional: No obvious distress; patient is non-toxic appearing  Cardiovascular: No visible lower extremity edema.  Respiratory: The patient does not have audible wheezing/stridor; respirations do not appear labored  Gastrointestinal: Abdomen non-distended Musculoskeletal: Normal ROM of UEs  Skin: No obvious rashes/open sores  Neurologic: CN 2-12 grossly  intact Psychiatric: Answered questions appropriately with normal affect  Hematologic/Lymphatic/Immunologic: No obvious bruises or sites of spontaneous bleeding  UA: negative  PVR: 33 ml  ASSESSMENT Overactive bladder - Plan: Urinalysis, Routine w reflex microscopic, BLADDER SCAN AMB NON-IMAGING, Vibegron  (GEMTESA ) 75 MG TABS  Mixed stress and urge urinary incontinence - Plan: Vibegron  (GEMTESA ) 75 MG TABS  Urinary urgency - Plan: Vibegron  (GEMTESA ) 75 MG TABS  Urinary frequency - Plan: Vibegron  (GEMTESA ) 75 MG TABS  OAB symptoms well managed on Gemtesa . Refills sent.   For SUI she plans to address her coughing with her PCP in the near future.  We agreed  to plan for follow up in 6 months or sooner if needed. Patient verbalized understanding of and agreement with current plan. All questions were answered.  PLAN Advised the following: 1. Continue Gemtesa  (Vibegron ) 75 mg daily. 2. Return in about 6 months (around 08/20/2024) for OAB, with UA & PVR.  Orders Placed This Encounter  Procedures   Urinalysis, Routine w reflex microscopic   BLADDER SCAN AMB NON-IMAGING    It has been explained that the patient is to follow regularly with their PCP in addition to all other providers involved in their care and to follow instructions provided by these respective offices. Patient advised to contact urology clinic if any urologic-pertaining questions, concerns, new symptoms or problems arise in the interim period.  There are no Patient Instructions on file for this visit.  Electronically signed by:  Lauraine JAYSON Oz, FNP   02/18/24    12:20 PM

## 2024-02-19 ENCOUNTER — Encounter (INDEPENDENT_AMBULATORY_CARE_PROVIDER_SITE_OTHER): Payer: Self-pay | Admitting: Gastroenterology

## 2024-02-20 ENCOUNTER — Other Ambulatory Visit: Payer: Self-pay | Admitting: Family Medicine

## 2024-02-20 DIAGNOSIS — F321 Major depressive disorder, single episode, moderate: Secondary | ICD-10-CM

## 2024-02-21 ENCOUNTER — Other Ambulatory Visit: Payer: Self-pay | Admitting: Physician Assistant

## 2024-02-23 ENCOUNTER — Encounter: Payer: Self-pay | Admitting: Pulmonary Disease

## 2024-02-23 ENCOUNTER — Ambulatory Visit: Admitting: Pulmonary Disease

## 2024-02-23 VITALS — BP 160/120 | HR 84 | Temp 98.0°F | Ht 62.0 in | Wt 226.4 lb

## 2024-02-23 DIAGNOSIS — F17210 Nicotine dependence, cigarettes, uncomplicated: Secondary | ICD-10-CM

## 2024-02-23 DIAGNOSIS — J449 Chronic obstructive pulmonary disease, unspecified: Secondary | ICD-10-CM | POA: Diagnosis not present

## 2024-02-23 DIAGNOSIS — J849 Interstitial pulmonary disease, unspecified: Secondary | ICD-10-CM

## 2024-02-23 DIAGNOSIS — R0602 Shortness of breath: Secondary | ICD-10-CM | POA: Diagnosis not present

## 2024-02-23 LAB — NITRIC OXIDE: Nitric Oxide: 6

## 2024-02-23 MED ORDER — SYMBICORT 160-4.5 MCG/ACT IN AERO: 2.0000 | INHALATION_SPRAY | Freq: Two times a day (BID) | RESPIRATORY_TRACT | 12 refills | Status: AC

## 2024-02-23 MED ORDER — SPIRIVA RESPIMAT 2.5 MCG/ACT IN AERS
2.0000 | INHALATION_SPRAY | Freq: Every day | RESPIRATORY_TRACT | 11 refills | Status: AC
Start: 2024-02-23 — End: ?

## 2024-02-23 NOTE — Progress Notes (Signed)
 Subjective:    Patient ID: [name], female    DOB: 23-Aug-1975, 48 y.o.   MRN: [id number]  Patient Care Team: Del Wilhelmena Lloyd Sola, FNP as PCP - [facility])  Chief Complaint  Patient presents with   Consult    Cough with occasional thick white phlegm. Shortness of breath on exertion and at rest. Occasional wheezing.     BACKGROUND: Patient is a 48 year old current smoker (79.5 PY) who presents for evaluation of shortness of breath and cough.  She is kindly referred by Lyle Decamp PA-C.  HPI Discussed the use of AI scribe software for clinical note transcription with the patient, who gave verbal consent to proceed.  History of Present Illness   [name] is a 48 year old female who presents with shortness of breath and cough. She was referred by Lyle Decamp, PA-C due to significant changes observed in chest x-rays read as persistent interstitial lung changes.  She experiences a severe cough that leads to shortness of breath, particularly at night when lying down. Episodes occur where she has to go outside to catch her breath due to the intensity of the cough. Initially, she was prescribed Symbicort  and an antibiotic, but her symptoms persisted, and she was told that a subsequent chest x-ray showed worsening compared to the first x-ray.  In July, she noticed significant swelling in her legs, ankles, and feet after neck surgery. Her previous blood pressure medication, amlodipine , was suspected to cause the swelling, so it was changed to another medication. Despite this change, the swelling has continued.  This is under investigation.  She has been smoking for 33 years, currently smoking about a pack and a half per day. She has attempted to quit smoking in the past but finds it challenging due to her family's smoking habits. Her mother had COPD and passed away three years ago, which she acknowledges as a concern.  She uses Symbicort   80/4.5, 2 puffs twice daily but reports that it is not providing sufficient relief, especially at night. She also uses albuterol  as a rescue inhaler when needed and is concerned about using more than the prescribed dose of Symbicort .  She has a history of sleep difficulties, which she manages with muscle relaxers prescribed for post-surgical neck pain. She previously underwent a sleep study and was prescribed sleep medication, but she has not pursued further evaluation or treatment in recent years.  Results of the prior sleep study are not available.       Review of Systems A 10 point review of systems was performed and it is as noted above otherwise negative.   Past Medical History:  Diagnosis Date   Anxiety    Phreesia 12/30/2019   Asthma    Depression    Dyspnea    GERD (gastroesophageal reflux disease)    Hidradenitis suppurativa    History of hiatal hernia    Hyperlipidemia    Hypertension    IBS (irritable bowel syndrome)    Intervertebral disc disorder with myelopathy, cervical region    OAB (overactive bladder)    Tobacco use    Vitamin B 12 deficiency    Vitamin D  deficiency     Past Surgical History:  Procedure Laterality Date   BIOPSY  05/22/2022   Procedure: BIOPSY;  Surgeon: Eartha Angelia Sieving, MD;  [facility];  Service: Gastroenterology;;   BIOPSY  11/26/2022   Procedure: BIOPSY;  Surgeon: Eartha Angelia Sieving, MD;  [facility];  Service: Gastroenterology;;   CERVICAL ANTERIOR DISC ARTHROPLASTY, 2 LEVEL N/A 11/10/2023   Procedure: CERVICAL ANTERIOR DISC ARTHROPLASTY, 2 LEVEL;  Surgeon: Claudene Penne ORN, MD;  Location: [facility];  Service: Neurosurgery;  Laterality: N/A;   CERVICAL DISC ARTHROPLASTY  11/10/2023   Procedure: CERVICAL ANTERIOR DISC ARTHROPLASTY Levels 5-7;  Surgeon: Claudene Penne ORN, MD;  Location: [facility];  Service: Neurosurgery;;   CHOLECYSTECTOMY     COLONOSCOPY WITH PROPOFOL  N/A 05/22/2022   Procedure:  COLONOSCOPY WITH PROPOFOL ;  Surgeon: Eartha Angelia Sieving, MD;  [facility];  Service: Gastroenterology;  Laterality: N/A;  1215 ASA 2   ESOPHAGOGASTRODUODENOSCOPY (EGD) WITH PROPOFOL  N/A 11/26/2022   Procedure: ESOPHAGOGASTRODUODENOSCOPY (EGD) WITH PROPOFOL ;  Surgeon: Eartha Angelia Sieving, MD;  [facility];  Service: Gastroenterology;  Laterality: N/A;  1:00PM;asa 2   POLYPECTOMY  05/22/2022   Procedure: POLYPECTOMY;  Surgeon: Eartha Angelia, Sieving, MD;  [facility];  Service: Gastroenterology;;   TUBAL LIGATION      Patient Active Problem List   Diagnosis Date Noted   Mass of left side of neck 02/12/2024   Leg swelling 02/12/2024   SOB (shortness of breath) 12/03/2023   Cervical radiculopathy 11/10/2023   Intervertebral cervical disc disorder with myelopathy, cervical region 10/21/2023   Left arm weakness 10/21/2023   Hidradenitis suppurativa 08/05/2023   Depression, major, single episode, moderate (HCC) 08/05/2023   Irritable bowel syndrome with constipation 06/09/2023   Mixed stress and urge urinary incontinence 04/07/2023   Neck pain 04/01/2023   Early satiety 03/01/2023   Overactive bladder 11/05/2022   Diarrhea 05/05/2022   Loss of weight 05/05/2022   Vitamin B12 deficiency 04/12/2020   Vitamin D  deficiency 04/12/2020   Dizziness 04/12/2020   HTN (hypertension) 12/30/2019   Tobacco abuse 12/30/2019   GERD (gastroesophageal reflux disease) 12/30/2019   Spondylosis 12/30/2019    Family History  Problem Relation Age of Onset   Hypertension Father    Hypercholesterolemia Father    COPD Mother    Emphysema Mother     Social History   Tobacco Use   Smoking status: Every Day    Current packs/day: 1.50    Average packs/day: 2.4 packs/day for 32.6 years (79.5 ttl pk-yrs)    Types: Cigarettes    Start date: 1993    Passive exposure: Current   Smokeless tobacco: Never  Substance Use Topics   Alcohol use: Not Currently     Allergies  Allergen Reactions   Other     laughing gas= skin feels like it burning    Current Meds  Medication Sig   albuterol  (VENTOLIN  HFA) 108 (90 Base) MCG/ACT inhaler Inhale 2 puffs into the lungs every 6 (six) hours as needed for wheezing or shortness of breath.   cephALEXin  (KEFLEX ) 500 MG capsule Take 1 capsule (500 mg total) by mouth 3 (three) times daily for 7 days.   Cholecalciferol  (VITAMIN D ) 125 MCG (5000 UT) CAPS Take 5,000 Units by mouth daily.   cyclobenzaprine  (FLEXERIL ) 10 MG tablet Take 1 tablet by mouth three times daily as needed for muscle spasm   escitalopram  (LEXAPRO ) 10 MG tablet Take 1 tablet by mouth once daily   furosemide  (LASIX ) 20 MG tablet Take 1 tablet (20 mg total) by mouth as needed for up to 1 dose for edema or fluid. For Leg swelling   gabapentin  (NEURONTIN ) 300 MG capsule TAKE 2 CAPSULES BY MOUTH THREE TIMES DAILY   linaclotide  (LINZESS ) 145 MCG CAPS capsule Take  1 capsule (145 mcg total) by mouth daily.   metoprolol  tartrate (LOPRESSOR ) 50 MG tablet Take 1 tablet by mouth twice daily   olmesartan  (BENICAR ) 20 MG tablet Take 0.5 tablets (10 mg total) by mouth daily.   Omega-3 Fatty Acids (FISH OIL ) 1000 MG CAPS Take 2 capsules (2,000 mg total) by mouth in the morning and at bedtime.   omeprazole  (PRILOSEC) 40 MG capsule Take 1 capsule (40 mg total) by mouth daily.   ondansetron  (ZOFRAN ) 4 MG tablet TAKE 1 TABLET BY MOUTH EVERY 8 HOURS AS NEEDED FOR NAUSEA OR VOMITING   oxyCODONE  (OXY IR/ROXICODONE ) 5 MG immediate release tablet Take 1 tablet (5 mg total) by mouth every 6 (six) hours as needed for severe pain (pain score 7-10).   rosuvastatin  (CRESTOR ) 5 MG tablet Take 1 tablet (5 mg total) by mouth daily.   senna (SENOKOT) 8.6 MG TABS tablet Take 1 tablet (8.6 mg total) by mouth 2 (two) times daily. (Patient taking differently: Take 1 tablet by mouth 2 (two) times daily. As needed)   SYMBICORT  160-4.5 MCG/ACT inhaler Inhale 2 puffs into the lungs  2 (two) times daily.   Tiotropium Bromide Monohydrate  (SPIRIVA  RESPIMAT) 2.5 MCG/ACT AERS Inhale 2 puffs into the lungs daily.   Vibegron  (GEMTESA ) 75 MG TABS Take 1 tablet (75 mg total) by mouth daily.   vitamin B-12 (CYANOCOBALAMIN ) 1000 MCG tablet Take 1 tablet (1,000 mcg total) by mouth daily.   [DISCONTINUED] budesonide -formoterol  (SYMBICORT ) 80-4.5 MCG/ACT inhaler Inhale 2 puffs into the lungs 2 (two) times daily.    Immunization History  Administered Date(s) Administered   Moderna Sars-Covid-2 Vaccination 10/28/2019, 11/29/2019   Tdap 11/05/2022        Objective:     BP (!) 160/120 (BP Location: Left Arm, Patient Position: Sitting, Cuff Size: Normal)   Pulse 84   Temp 98 F (36.7 C) (Oral)   Ht 5' 2 (1.575 m)   Wt 226 lb 6.4 oz (102.7 kg)   LMP 01/20/2024 (Approximate)   SpO2 98%   BMI 41.41 kg/m   SpO2: 98 %  GENERAL: Obese, fully ambulatory.  No conversational dyspnea. HEAD: Normocephalic, atraumatic.  EYES: Pupils equal, round, reactive to light.  No scleral icterus.  MOUTH: Edentulous, wears dentures.  Oral mucosa moist.  No thrush. NECK: Supple. No thyromegaly. Trachea midline. No JVD.  No adenopathy. PULMONARY: Good air entry bilaterally.  Wheezing noted. CARDIOVASCULAR: S1 and S2. Regular rate and rhythm.  ABDOMEN: Significant truncal obesity. MUSCULOSKELETAL: No joint deformity, no clubbing, no edema.  NEUROLOGIC: No overt focal deficit, no gait disturbance, speech is fluent. SKIN: Intact,warm,dry. PSYCH: Mood and behavior normal.  Lab Results  Component Value Date   NITRICOXIDE 6 02/23/2024  *No evidence of type II inflammation noted.   Chest x-ray from 30 January 2024 showing coarse markings:    Assessment & Plan:     ICD-10-CM   1. Shortness of breath  R06.02 Nitric oxide     2. COPD suggested by initial evaluation (HCC)  J44.9     3. ILD (interstitial lung disease) (HCC) -under investigation  J84.9    Query RB ILD    4. Tobacco  dependence due to cigarettes  F17.210       Orders Placed This Encounter  Procedures   Nitric oxide     Meds ordered this encounter  Medications   SYMBICORT  160-4.5 MCG/ACT inhaler    Sig: Inhale 2 puffs into the lungs 2 (two) times daily.    Dispense:  10.2 g  Refill:  12   Tiotropium Bromide Monohydrate  (SPIRIVA  RESPIMAT) 2.5 MCG/ACT AERS    Sig: Inhale 2 puffs into the lungs daily.    Dispense:  4 g    Refill:  11   Discussion:    Chronic cough and dyspnea Chronic cough and dyspnea, exacerbated at night and upon exertion, with wheezing on examination. Symptoms are not well controlled with current inhaler regimen. Previous chest x-rays showed changes consistent with smoking-related lung damage. Differential includes obstructive lung disease and potential interstitial lung disease (ILD) changes. - Order high-resolution chest CT to investigate potential ILD changes - Schedule pulmonary function tests (PFTs) - Increase Symbicort  to 160/4.5, two inhalations twice a day - Add Spiriva  2.5 mcg, two puffs once a day  Tobacco use disorder Long-standing tobacco use disorder with smoking 1.5 packs per day for 33 years. Family history of COPD and emphysema, with her mother having passed away from related complications. - Strongly advise smoking cessation - Discuss risks of continued smoking and benefits of quitting       Advised if symptoms do not improve or worsen, to please contact office for sooner follow up or seek emergency care.    I spent 60 minutes of dedicated to the care of this patient on the date of this encounter to include pre-visit review of records, face-to-face time with the patient discussing conditions above, post visit ordering of testing, clinical documentation with the electronic health record, making appropriate referrals as documented, and communicating necessary findings to members of the patients care team.   C. Leita Sanders, MD Advanced Bronchoscopy PCCM  [facility]    *This note was dictated using voice recognition software/Dragon.  Despite best efforts to proofread, errors can occur which can change the meaning. Any transcriptional errors that result from this process are unintentional and may not be fully corrected at the time of dictation.

## 2024-02-23 NOTE — Patient Instructions (Signed)
 VISIT SUMMARY:  Today, you were seen for your ongoing issues with shortness of breath and cough, which have been worsening despite your current treatment. We discussed your symptoms, reviewed your smoking history, and planned further investigations and adjustments to your medications.  YOUR PLAN:  -CHRONIC COUGH AND DYSPNEA: Chronic cough and dyspnea mean you have a persistent cough and difficulty breathing. These symptoms are worse at night and with activity. We will conduct a high-resolution chest CT and pulmonary function tests to investigate potential lung diseases. Your Symbicort  dose will be increased to 160/4.5, two inhalations twice a day, and we will add Spiriva  2.5 mcg, two puffs once a day to help manage your symptoms.  -TOBACCO USE DISORDER: Tobacco use disorder means you have a long-term dependence on smoking. Given your history and family background, it is crucial to quit smoking. We discussed the serious health risks of continued smoking and the significant benefits of quitting.  INSTRUCTIONS:  Please schedule the high-resolution chest CT and pulmonary function tests as soon as possible. Follow the new medication regimen: increase Symbicort  to 160/4.5, two inhalations twice a day, and add Spiriva  2.5 mcg, two puffs once a day. Strongly consider smoking cessation and reach out if you need support or resources to help you quit.

## 2024-02-26 ENCOUNTER — Ambulatory Visit (HOSPITAL_COMMUNITY)
Admission: RE | Admit: 2024-02-26 | Discharge: 2024-02-26 | Disposition: A | Discharge status: Home / Self Care | Source: Ambulatory Visit | Attending: Family Medicine | Admitting: Family Medicine

## 2024-02-26 DIAGNOSIS — M542 Cervicalgia: Secondary | ICD-10-CM | POA: Diagnosis not present

## 2024-02-26 DIAGNOSIS — R221 Localized swelling, mass and lump, neck: Secondary | ICD-10-CM | POA: Insufficient documentation

## 2024-02-26 MED ORDER — IOHEXOL 300 MG/ML  SOLN
75.0000 mL | Freq: Once | INTRAMUSCULAR | Status: AC | PRN
  Administered 2024-02-26: 75 mL via INTRAVENOUS

## 2024-03-04 ENCOUNTER — Ambulatory Visit: Payer: Self-pay | Admitting: Family Medicine

## 2024-03-07 ENCOUNTER — Other Ambulatory Visit: Payer: Self-pay | Admitting: Neurosurgery

## 2024-03-08 ENCOUNTER — Ambulatory Visit: Attending: Internal Medicine

## 2024-03-08 DIAGNOSIS — M7989 Other specified soft tissue disorders: Secondary | ICD-10-CM | POA: Diagnosis not present

## 2024-03-09 ENCOUNTER — Ambulatory Visit: Payer: Self-pay | Admitting: Family Medicine

## 2024-03-16 NOTE — Therapy (Unsigned)
 OUTPATIENT PHYSICAL THERAPY CERVICAL EVALUATION   Patient Name: [name] MRN: [id number] DOB:03-18-1976, 48 y.o., female Today's Date: 03/17/2024  END OF SESSION:  PT End of Session - 03/17/24 1103     Visit Number 1    Number of Visits 13    Date for PT Re-Evaluation 04/14/24    Authorization Type [place] Medicaid [employer]    Authorization Time Period Auth requested    Progress Note Due on Visit 10    PT Start Time 1105    PT Stop Time 1145    PT Time Calculation (min) 40 min    Activity Tolerance Patient tolerated treatment well    Behavior During Therapy WFL for tasks assessed/performed          Past Medical History:  Diagnosis Date   Anxiety    [employer] 12/30/2019   Asthma    Depression    Dyspnea    GERD (gastroesophageal reflux disease)    Hidradenitis suppurativa    History of hiatal hernia    Hyperlipidemia    Hypertension    IBS (irritable bowel syndrome)    Intervertebral disc disorder with myelopathy, cervical region    OAB (overactive bladder)    Tobacco use    Vitamin B 12 deficiency    Vitamin D  deficiency    Past Surgical History:  Procedure Laterality Date   BIOPSY  05/22/2022   Procedure: BIOPSY;  Surgeon: Eartha Angelia Sieving, MD;  [facility];  Service: Gastroenterology;;   BIOPSY  11/26/2022   Procedure: BIOPSY;  Surgeon: Eartha Angelia Sieving, MD;  Location: [facility];  Service: Gastroenterology;;   CERVICAL ANTERIOR DISC ARTHROPLASTY, 2 LEVEL N/A 11/10/2023   Procedure: CERVICAL ANTERIOR DISC ARTHROPLASTY, 2 LEVEL;  Surgeon: Claudene Penne ORN, MD;  Location: [facility];  Service: Neurosurgery;  Laterality: N/A;   CERVICAL DISC ARTHROPLASTY  11/10/2023   Procedure: CERVICAL ANTERIOR DISC ARTHROPLASTY Levels 5-7;  Surgeon: Claudene Penne ORN, MD;  Location: [facility];  Service: Neurosurgery;;   CHOLECYSTECTOMY     COLONOSCOPY WITH PROPOFOL  N/A 05/22/2022   Procedure: COLONOSCOPY WITH PROPOFOL ;  Surgeon: Eartha Angelia Sieving, MD;  [facility]: [facility];  Service: Gastroenterology;  Laterality: N/A;  1215 ASA 2   ESOPHAGOGASTRODUODENOSCOPY (EGD) WITH PROPOFOL  N/A 11/26/2022   Procedure: ESOPHAGOGASTRODUODENOSCOPY (EGD) WITH PROPOFOL ;  Surgeon: Eartha Angelia Sieving, MD;  Location: [facility];  Service: Gastroenterology;  Laterality: N/A;  1:00PM;asa 2   POLYPECTOMY  05/22/2022   Procedure: POLYPECTOMY;  Surgeon: Eartha Angelia, Sieving, MD;  Location: [facility];  Service: Gastroenterology;;   TUBAL LIGATION     Patient Active Problem List   Diagnosis Date Noted   Mass of left side of neck 02/12/2024   Leg swelling 02/12/2024   SOB (shortness of breath) 12/03/2023   Cervical radiculopathy 11/10/2023   Intervertebral cervical disc disorder with myelopathy, cervical region 10/21/2023   Left arm weakness 10/21/2023   Hidradenitis suppurativa 08/05/2023   Depression, major, single episode, moderate (HCC) 08/05/2023   Irritable bowel syndrome with constipation 06/09/2023   Mixed stress and urge urinary incontinence 04/07/2023   Neck pain 04/01/2023   Early satiety 03/01/2023   Overactive bladder 11/05/2022   Diarrhea 05/05/2022   Loss of weight 05/05/2022   Vitamin B12 deficiency 04/12/2020   Vitamin D  deficiency 04/12/2020   Dizziness 04/12/2020   HTN (hypertension) 12/30/2019   Tobacco abuse 12/30/2019   GERD (gastroesophageal reflux disease) 12/30/2019   Spondylosis 12/30/2019    PCP:  Del Wilhelmena Falter, Hilario, FNP  REFERRING PROVIDER: Ulis Bottcher, PA-C  REFERRING DIAG: [phone] (ICD-10-CM) - Left arm weakness M50.10 (ICD-10-CM) - Cervical disc disorder with radiculopathy of cervical region  THERAPY DIAG:  Cervicalgia  Impaired function of upper extremity  Decreased ROM of intervertebral discs of cervical spine  Numbness and tingling of both upper extremities  Rationale for Evaluation and Treatment: Rehabilitation  ONSET DATE: April 29th  SUBJECTIVE:                                                                                                                                                                                                          SUBJECTIVE STATEMENT: Patient reports since last episode, she agreed to surgery. Had surgery on cervical spine on April 29th, 2025. Reports previous weakness in LUE is better but now RUE is bad. Goes for carpal tunnel testing tomorrow. Reports her R arm just goes to sleep when she writing. Increased numbness/tingling down UE with cervical flexion but N/T nearly constant.  Hand dominance: Right  Reports she recently changed BP mediations, MD put her on a new inhaler, increased dosage of another inhaler and she has discontinued pain medications.   PERTINENT HISTORY:  Chronic cervical pain Cervical Disc Arthroplasty Myelopathy of cervical region    PAIN:  Are you having pain? Yes: NPRS scale: Neck: 6-7/10, arm pain: R 7/10 and L 4/10 Pain location: Neck and BUE Pain description: constant ache pain in neck, N/T down arms  Aggravating factors: cervical flexion and/or rotation Relieving factors: Rest, No motion, heat/hot shower, Gabapentin   PRECAUTIONS: None  RED FLAGS: Numbness tingling down both UE     WEIGHT BEARING RESTRICTIONS: No  FALLS:  Has patient fallen in last 6 months? Yes. Number of falls 1  OCCUPATION: N/A  PLOF: Assisted with iADLs, independent with ADLs mostly but intermittent assist with lower body cleaning due to dizziness, intermittent assist with washing hair due to UE weakness  PATIENT GOALS: To strengthen arms and to try to relieve tension in neck because its so sore  NEXT MD VISIT: September 10th, 2025  OBJECTIVE:  Note: Objective measures were completed at Evaluation unless otherwise noted.  DIAGNOSTIC FINDINGS:  FINDINGS: Normal alignment. C5-C6 and C6-C7 disc arthroplasty in unchanged alignment. No periprosthetic lucency. Remaining disc spaces are normal. No  fracture. No prevertebral soft tissue thickening.   IMPRESSION: C5-C6 and C6-C7 disc arthroplasty without complication.    PATIENT SURVEYS:  Neck Disability Index score: 33 / 50 = 66.0 % Upper Extremity Function Scale: 48 / 80 = 60.0 %  COGNITION: Overall cognitive status: Within functional limits for tasks assessed  SENSATION: Light touch: Impaired  Dec sensation throughout R cerv dermatomes   POSTURE: rounded shoulders and forward head  PALPATION: Very TTP with grade 1 CPA/UPA throughout cervical and upper thoracic spin    CERVICAL ROM:   Active ROM A/PROM (deg) eval  Flexion 15 deg, pain in neck   Extension 30 deg, pain in neck  Right lateral flexion 25, pain in neck  Left lateral flexion 20, pain in neck  Right rotation 30, pain in neck  Left rotation 35, pain in neck and N/T down RLE   (Blank rows = not tested)  UPPER EXTREMITY ROM:  Active ROM Right eval Left eval  Shoulder flexion 120 120  Shoulder extension    Shoulder abduction 120 120  Shoulder adduction    Shoulder extension    Shoulder internal rotation    Shoulder external rotation    Elbow flexion    Elbow extension    Wrist flexion    Wrist extension    Wrist ulnar deviation    Wrist radial deviation    Wrist pronation    Wrist supination     (Blank rows = not tested)  UPPER EXTREMITY MMT:  MMT Right eval Left eval  Shoulder flexion 4- 4-  Shoulder extension    Shoulder abduction 4- 4-  Shoulder adduction    Shoulder extension    Shoulder internal rotation 4- 4-  Shoulder external rotation 4- 4-  Middle trapezius    Lower trapezius    Elbow flexion    Elbow extension    Wrist flexion    Wrist extension    Wrist ulnar deviation    Wrist radial deviation    Wrist pronation    Wrist supination    Grip strength     (Blank rows = not tested)  CERVICAL SPECIAL TESTS:  Distraction test: Positive Decrease in N/T down RUE  FUNCTIONAL TESTS:    TREATMENT DATE:  03/17/24:  PT Eval and HEP                                                                                                                                 PATIENT EDUCATION:  Education details: PT evaluation, objective findings, POC, Importance of HEP, Precautions, Clinic policies  Person educated: Patient Education method: Explanation and Demonstration Education comprehension: verbalized understanding and returned demonstration  HOME EXERCISE PROGRAM: Access Code: [email or web address] URL: [email or web address] Date: 03/17/2024 Prepared by: Rosaria Powell-Butler  Exercises - Seated Levator Scapulae Stretch  - 2 x daily - 7 x weekly - 3 sets - 30 hold - Seated Upper Trapezius Stretch  - 2 x daily - 7 x weekly - 3 sets - 30 hold - Seated Scapular Retraction  - 2 x daily - 7 x weekly - 3 sets - 10 reps - Supine Cervical Rotation AROM on Pillow  - 2 x daily - 7 x weekly -  3 sets - 10 reps - Supine Chin Tuck  - 2 x daily - 7 x weekly - 3 sets - 10 reps  ASSESSMENT:  CLINICAL IMPRESSION: Patient is a 48 y.o. female who was seen today for physical therapy evaluation and treatment for R29.898 (ICD-10-CM) - Left arm weakness M50.10 (ICD-10-CM) - Cervical disc disorder with radiculopathy of cervical region. On this date, patient continues to demonstrate impaired self perceived function, decreased cervical ROM, decreased UE ROM, decreased UE strength, pain with palpation to cervical spine and reports radiating pain down both UE (R>L at this time) with cervical motion. Patient reports improvements with radicular pain with distraction testing. HEP administered this date. Patient will benefit from skilled physical therapy in order to address current deficits in order to improve current level of function, quality of life, and pain reduction.     OBJECTIVE IMPAIRMENTS: decreased mobility, decreased ROM, decreased strength, impaired UE functional use, improper body mechanics, postural dysfunction, and pain.    ACTIVITY LIMITATIONS: carrying, lifting, sleeping, dressing, reach over head, and hygiene/grooming  PARTICIPATION LIMITATIONS: meal prep, cleaning, laundry, driving, community activity, and occupation  PERSONAL FACTORS: Time since onset of injury/illness/exacerbation are also affecting patient's functional outcome.   REHAB POTENTIAL: Good  CLINICAL DECISION MAKING: Stable/uncomplicated  EVALUATION COMPLEXITY: Low   GOALS: Goals reviewed with patient? No  SHORT TERM GOALS: Target date: 04/07/24 Patient will be independent with performance of HEP to demonstrate adequate self management of symptoms.  Baseline:  Goal status: INITIAL  2.   Patient will report at least a 25% improvement with function and/or pain reduction overall since beginning PT. Baseline:  Goal status: INITIAL   LONG TERM GOALS: Target date: 04/28/24 Patient will improve NDI score by 18  points to demonstrate improved perceived function. Baseline: See above Goal status: INITIAL 2.  Patient will improve cervical extension ROM to at least 70 degrees to show improved neck mobility for safe performance of functional tasks such as looking upward to higher cabinets.  Baseline: See above Goal status: INITIAL 3.  Patient will improve cervical rotation ROM to at least 70 degrees bilaterally to show improved neck mobility for safe performance of functional tasks such as driving.  Baseline: See above Goal status: INITIAL 4. Patient will improve shoulder flexion ROM by at least 20 degrees bilaterally in order to demonstrate improved shoulder mobility needed for functional tasks such as reaching overhead or washing hair. Baseline: See above Goal status: INITIAL 5.  Patient will improve shoulder flexion MMT bilaterally by at least 1/2 a grade with reports of decreased pain to show increased UE strength. Baseline: See above Goal status: INITIAL    PLAN:  PT FREQUENCY: 2x/week  PT DURATION: 6 weeks  PLANNED  INTERVENTIONS: 97164- PT Re-evaluation, 97110-Therapeutic exercises, 97530- Therapeutic activity, 97112- Neuromuscular re-education, 97535- Self Care, 02859- Manual therapy, [phone]- Gait training, [phone]- Electrical stimulation (manual), [id number]- Traction (mechanical), [phone] (1-2 muscles), [id number] (3+ muscles)- Dry Needling, Patient/Family education, Balance training, Stair training, Taping, Joint mobilization, Spinal mobilization, Cryotherapy, and Moist heat  PLAN FOR NEXT SESSION: Review HEP and goals, add ext SNAG to HEP, cont prog cervical mobility/strength, address UE mobility/strengthening, manual techniques as needed: STM, distraction, mobilizations   3:42 PM, 2024-04-05 Rosaria Settler, PT, DPT [facility]    Managed Medicaid Authorization Request Treatment Start Date: Apr 05, 2024  Visit Dx Codes:  M54.2 R68.89 M53.82 R20.0    R20.2  Functional Tool Score:  Neck Disability Index score: 33 / 50 = 66.0 % Upper  Extremity Function Scale: 48 / 80 = 60.0 %  For all possible CPT codes, reference the Planned Interventions line above.     Check all conditions that are expected to impact treatment: {Conditions expected to impact treatment:None of these apply   If treatment provided at initial evaluation, no treatment charged due to lack of authorization.

## 2024-03-17 ENCOUNTER — Other Ambulatory Visit: Payer: Self-pay

## 2024-03-17 ENCOUNTER — Ambulatory Visit (HOSPITAL_COMMUNITY): Attending: Family Medicine

## 2024-03-17 ENCOUNTER — Encounter (HOSPITAL_COMMUNITY): Payer: Self-pay

## 2024-03-17 DIAGNOSIS — R202 Paresthesia of skin: Secondary | ICD-10-CM | POA: Diagnosis present

## 2024-03-17 DIAGNOSIS — R200 Anesthesia of skin: Secondary | ICD-10-CM | POA: Diagnosis present

## 2024-03-17 DIAGNOSIS — R6889 Other general symptoms and signs: Secondary | ICD-10-CM | POA: Diagnosis present

## 2024-03-17 DIAGNOSIS — M5010 Cervical disc disorder with radiculopathy, unspecified cervical region: Secondary | ICD-10-CM | POA: Diagnosis not present

## 2024-03-17 DIAGNOSIS — R29898 Other symptoms and signs involving the musculoskeletal system: Secondary | ICD-10-CM | POA: Insufficient documentation

## 2024-03-17 DIAGNOSIS — M542 Cervicalgia: Secondary | ICD-10-CM | POA: Insufficient documentation

## 2024-03-17 DIAGNOSIS — M5382 Other specified dorsopathies, cervical region: Secondary | ICD-10-CM | POA: Diagnosis present

## 2024-03-18 ENCOUNTER — Ambulatory Visit: Payer: Self-pay | Admitting: Physician Assistant

## 2024-03-18 ENCOUNTER — Ambulatory Visit: Admitting: Neurology

## 2024-03-18 DIAGNOSIS — M5412 Radiculopathy, cervical region: Secondary | ICD-10-CM

## 2024-03-18 DIAGNOSIS — R202 Paresthesia of skin: Secondary | ICD-10-CM

## 2024-03-18 DIAGNOSIS — G5601 Carpal tunnel syndrome, right upper limb: Secondary | ICD-10-CM

## 2024-03-18 NOTE — Procedures (Signed)
  [facility]  [address] [place], [address]  [place], [place] [address] Tel: [phone] Fax: [phone] Test Date:  03/18/2024  Patient: [name] DOB: Feb 22, 1976 Physician: Tonita Blanch, DO  Sex: Female Height: 5' 2 Ref Phys: Lyle Decamp, DEVONNA  ID#: [id number]   Technician:    History: This is a 48 year old female referred for evaluation of right hand paresthesias.  NCV & EMG Findings: Extensive electrodiagnostic testing of the right upper extremity shows:  Right median sensory response shows prolonged latency (5.1 ms) and reduced amplitude (9.1 V).  Right ulnar sensory response is within normal limits. Right median motor response shows prolonged latency (4.8 ms).  Right ulnar motor response is within normal limits.  Chronic motor axon loss changes are seen affecting the C7 myotome, without accompanying active denervation.  Impression: Right median neuropathy at or distal to the wrist, consistent with a clinical diagnosis of carpal tunnel syndrome.  Overall, these findings are moderate-to-severe in degree electrically. Chronic right C7 radiculopathy, mild.   ___________________________ Tonita Blanch, DO    Nerve Conduction Studies   Stim Site NR Peak (ms) Norm Peak (ms) O-P Amp (V) Norm O-P Amp  Right Median Anti Sensory (2nd Digit)  32 C  Wrist    *5.1 <3.4 *9.1 >20  Right Ulnar Anti Sensory (5th Digit)  32 C  Wrist    2.1 <3.1 38.2 >12     Stim Site NR Onset (ms) Norm Onset (ms) O-P Amp (mV) Norm O-P Amp Site1 Site2 Delta-0 (ms) Dist (cm) Vel (m/s) Norm Vel (m/s)  Right Median Motor (Abd Poll Brev)  32 C  Wrist    *4.8 <3.9 8.6 >6 Elbow Wrist 4.6 27.0 59 >50  Elbow    9.4  8.2         Right Ulnar Motor (Abd Dig Minimi)  32 C  Wrist    1.8 <3.1 9.5 >7 B Elbow Wrist 3.1 21.0 68 >50  B Elbow    4.9  8.8  A Elbow B Elbow 1.5 10.0 67 >50  A Elbow    6.4  8.8          Electromyography   Side Muscle Ins.Act Fibs Fasc Recrt Amp Dur Poly  Activation Comment  Right 1stDorInt Nml Nml Nml Nml Nml Nml Nml Nml N/A  Right Abd Poll Brev Nml Nml Nml Nml Nml Nml Nml Nml N/A  Right PronatorTeres Nml Nml Nml Nml Nml Nml Nml Nml N/A  Right Biceps Nml Nml Nml *1- *1+ *1+ *1+ Nml N/A  Right Triceps Nml Nml Nml *1- *1+ *1+ *1+ Nml N/A  Right Deltoid Nml Nml Nml Nml Nml Nml Nml Nml N/A      Waveforms:

## 2024-03-21 ENCOUNTER — Encounter (HOSPITAL_COMMUNITY): Payer: Self-pay

## 2024-03-21 ENCOUNTER — Ambulatory Visit (HOSPITAL_COMMUNITY)

## 2024-03-22 ENCOUNTER — Other Ambulatory Visit: Payer: Self-pay | Admitting: Family Medicine

## 2024-03-22 DIAGNOSIS — F321 Major depressive disorder, single episode, moderate: Secondary | ICD-10-CM

## 2024-03-22 NOTE — Progress Notes (Deleted)
 Referring Physician:  [name], FNP [phone] [address]. [address] [place],  [place] [address]  Primary Physician:  [name], FNP  History of Present Illness: 03/22/2024 [name] is here today with a chief complaint of ***   PreCharting for nerve patients : EMG - 03/18/2024 Imaging of Nerve, Ultrasound or MRI? -  Prior Surgery?-   Intake: Are you Having Pain? If so, where?: *** Are you having weakness? If so, where?: *** Are you having sensory changes? If so where?: *** When did it start?: *** How did it start/happen?: ***  Conservative measures:  Physical therapy: {yes/no:20286} Occupational Therapy: {yes/no:20286} Hand Therapy: {yes/no:20286} Injections: {yes/no:20286} Gabapentin : {yes/no:20286}, Lyrica: {yes/no:20286}, Cymbalta: {yes/no:20286} Past Surgery: {yes/no:20286}  Is this a Second Opinion: {yes/no:20286}  Main Question for Surgeon: *** 02/01/2024 Note from [place], [place] DOS: 11/10/23, C5-7 arthroplasty  HISTORY OF PRESENT ILLNESS: [name] is 11 weeks status post C5-7 arthroplasty. Overall, she is doing well.  States the range of motion of her neck is still limited, however her stiffness has improved somewhat.  She continues to use Flexeril  and gabapentin .  She complains of her right hand going numb specifically when she is writing or doing work around the house.  In addition she is also complaining of back pain.  It prevents her from walking a distance and she often has to lean forward secondary to the pain.    [name] has ***no symptoms of cervical myelopathy.  The symptoms are causing a significant impact on the patient's life.   I have utilized the care everywhere function in epic to review the outside records available from external health systems.  Review of Systems:  A 10 point review of systems is negative, except for the pertinent positives and negatives detailed in the HPI.  Past Medical  History: Past Medical History:  Diagnosis Date   Anxiety    Phreesia 12/30/2019   Asthma    Depression    Dyspnea    GERD (gastroesophageal reflux disease)    Hidradenitis suppurativa    History of hiatal hernia    Hyperlipidemia    Hypertension    IBS (irritable bowel syndrome)    Intervertebral disc disorder with myelopathy, cervical region    OAB (overactive bladder)    Tobacco use    Vitamin B 12 deficiency    Vitamin D  deficiency     Past Surgical History: Past Surgical History:  Procedure Laterality Date   BIOPSY  05/22/2022   Procedure: BIOPSY;  Surgeon: Eartha Angelia Sieving, MD;  [facility];  Service: Gastroenterology;;   BIOPSY  11/26/2022   Procedure: BIOPSY;  Surgeon: Eartha Angelia Sieving, MD;  [facility]: [facility];  Service: Gastroenterology;;   CERVICAL ANTERIOR DISC ARTHROPLASTY, 2 LEVEL N/A 11/10/2023   Procedure: CERVICAL ANTERIOR DISC ARTHROPLASTY, 2 LEVEL;  Surgeon: Claudene Penne ORN, MD;  Location: [facility];  Service: Neurosurgery;  Laterality: N/A;   CERVICAL DISC ARTHROPLASTY  11/10/2023   Procedure: CERVICAL ANTERIOR DISC ARTHROPLASTY Levels 5-7;  Surgeon: Claudene Penne ORN, MD;  Location: [facility];  Service: Neurosurgery;;   CHOLECYSTECTOMY     COLONOSCOPY WITH PROPOFOL  N/A 05/22/2022   Procedure: COLONOSCOPY WITH PROPOFOL ;  Surgeon: Eartha Angelia Sieving, MD;  [facility]: [facility];  Service: Gastroenterology;  Laterality: N/A;  1215 ASA 2   ESOPHAGOGASTRODUODENOSCOPY (EGD) WITH PROPOFOL  N/A 11/26/2022   Procedure: ESOPHAGOGASTRODUODENOSCOPY (EGD) WITH PROPOFOL ;  Surgeon: Eartha Angelia Sieving, MD;  Location:  [facility];  Service: Gastroenterology;  Laterality: N/A;  1:00PM;asa 2   POLYPECTOMY  05/22/2022   Procedure: POLYPECTOMY;  Surgeon: Eartha Angelia Sieving, MD;  Location: [facility];  Service: Gastroenterology;;   TUBAL LIGATION      Allergies: Allergies as of 03/23/2024 - Review Complete 03/17/2024   Allergen Reaction Noted   Other  09/13/2012    Medications:  Current Outpatient Medications:    albuterol  (VENTOLIN  HFA) 108 (90 Base) MCG/ACT inhaler, Inhale 2 puffs into the lungs every 6 (six) hours as needed for wheezing or shortness of breath., Disp: 8 g, Rfl: 2   Cholecalciferol  (VITAMIN D ) 125 MCG (5000 UT) CAPS, Take 5,000 Units by mouth daily., Disp: , Rfl:    cyclobenzaprine  (FLEXERIL ) 10 MG tablet, Take 1 tablet by mouth three times daily as needed for muscle spasm, Disp: 90 tablet, Rfl: 0   escitalopram  (LEXAPRO ) 10 MG tablet, Take 1 tablet by mouth once daily, Disp: 30 tablet, Rfl: 0   furosemide  (LASIX ) 20 MG tablet, Take 1 tablet (20 mg total) by mouth as needed for up to 1 dose for edema or fluid. For Leg swelling, Disp: 30 tablet, Rfl: 3   gabapentin  (NEURONTIN ) 300 MG capsule, TAKE 2 CAPSULES BY MOUTH THREE TIMES DAILY, Disp: 180 capsule, Rfl: 0   linaclotide  (LINZESS ) 145 MCG CAPS capsule, Take 1 capsule (145 mcg total) by mouth daily., Disp: 30 capsule, Rfl: 5   metoprolol  tartrate (LOPRESSOR ) 50 MG tablet, Take 1 tablet by mouth twice daily, Disp: 90 tablet, Rfl: 0   olmesartan  (BENICAR ) 20 MG tablet, Take 0.5 tablets (10 mg total) by mouth daily., Disp: 30 tablet, Rfl: 2   Omega-3 Fatty Acids (FISH OIL ) 1000 MG CAPS, Take 2 capsules (2,000 mg total) by mouth in the morning and at bedtime., Disp: 90 capsule, Rfl: 3   omeprazole  (PRILOSEC) 40 MG capsule, Take 1 capsule (40 mg total) by mouth daily., Disp: 60 capsule, Rfl: 3   ondansetron  (ZOFRAN ) 4 MG tablet, TAKE 1 TABLET BY MOUTH EVERY 8 HOURS AS NEEDED FOR NAUSEA OR VOMITING, Disp: 60 tablet, Rfl: 1   oxyCODONE  (OXY IR/ROXICODONE ) 5 MG immediate release tablet, Take 1 tablet (5 mg total) by mouth every 6 (six) hours as needed for severe pain (pain score 7-10)., Disp: 30 tablet, Rfl: 0   rosuvastatin  (CRESTOR ) 5 MG tablet, Take 1 tablet (5 mg total) by mouth daily., Disp: 90 tablet, Rfl: 3   senna (SENOKOT) 8.6 MG TABS  tablet, Take 1 tablet (8.6 mg total) by mouth 2 (two) times daily. (Patient taking differently: Take 1 tablet by mouth 2 (two) times daily. As needed), Disp: 120 tablet, Rfl: 0   SYMBICORT  160-4.5 MCG/ACT inhaler, Inhale 2 puffs into the lungs 2 (two) times daily., Disp: 10.2 g, Rfl: 12   Tiotropium Bromide Monohydrate  (SPIRIVA  RESPIMAT) 2.5 MCG/ACT AERS, Inhale 2 puffs into the lungs daily., Disp: 4 g, Rfl: 11   Vibegron  (GEMTESA ) 75 MG TABS, Take 1 tablet (75 mg total) by mouth daily., Disp: 30 tablet, Rfl: 11   vitamin B-12 (CYANOCOBALAMIN ) 1000 MCG tablet, Take 1 tablet (1,000 mcg total) by mouth daily., Disp: 60 tablet, Rfl: 1  Social History: Social History   Tobacco Use   Smoking status: Every Day    Current packs/day: 1.50    Average packs/day: 2.4 packs/day for 32.7 years (79.6 ttl pk-yrs)    Types: Cigarettes    Start date: 1993    Passive exposure: Current   Smokeless tobacco: Never  Vaping Use   Vaping status: Never Used  Substance Use Topics   Alcohol use: Not Currently   Drug use: No    Family Medical History: Family History  Problem Relation Age of Onset   Hypertension Father    Hypercholesterolemia Father    COPD Mother    Emphysema Mother     Physical Examination: There were no vitals filed for this visit.  General: Patient is in no apparent distress. Attention to examination is appropriate.  Neck:   Supple.  Full range of motion.  Respiratory: Patient is breathing without any difficulty.   NEUROLOGICAL:     Awake, alert, oriented to person, place, and time.  Speech is clear and fluent.   Cranial Nerves: Pupils equal round and reactive to light.  Facial tone is symmetric.  Facial sensation is symmetric. Shoulder shrug is symmetric. Tongue protrusion is midline.    Strength: Side Biceps Triceps Deltoid Interossei Grip Wrist Ext. Wrist Flex.  R 5 5 5 5 5 5 5   L 5 5 5 5 5 5 5    Side Iliopsoas Quads Hamstring PF DF EHL  R 5 5 5 5 5 5   L 5 5 5 5 5 5     Reflexes are ***2+ and symmetric at the biceps, triceps, brachioradialis, patella and achilles.   Hoffman's is absent. Clonus is absent  Bilateral upper and lower extremity sensation is intact to light touch ***.     No evidence of dysmetria noted.  Gait is normal.    Imaging: *** I have personally reviewed the images and agree with the above interpretation.  Medical Decision Making/Assessment and Plan: Ms. Coryell is a pleasant 48 y.o. female with ***  There are no diagnoses linked to this encounter.   Thank you for involving me in the care of this patient.    Penne MICAEL Sharps MD/MSCR Neurosurgery

## 2024-03-23 ENCOUNTER — Encounter: Payer: Self-pay | Admitting: Neurosurgery

## 2024-03-23 ENCOUNTER — Ambulatory Visit: Payer: Self-pay | Admitting: Neurosurgery

## 2024-03-23 ENCOUNTER — Ambulatory Visit: Admitting: Neurosurgery

## 2024-03-23 ENCOUNTER — Other Ambulatory Visit: Payer: Self-pay

## 2024-03-23 VITALS — BP 132/86 | Ht 62.0 in | Wt 226.0 lb

## 2024-03-23 DIAGNOSIS — Z01818 Encounter for other preprocedural examination: Secondary | ICD-10-CM

## 2024-03-23 DIAGNOSIS — G5601 Carpal tunnel syndrome, right upper limb: Secondary | ICD-10-CM

## 2024-03-23 NOTE — Progress Notes (Signed)
 REFERRING PHYSICIAN:  Del Wilhelmena Lloyd Sola, Fnp [phone] [address] [place],  [place] [address]  DOS: 11/10/23, C5-7 arthroplasty  HISTORY OF PRESENT ILLNESS: [name] is well-known to our office.  She has a history of C5-C7 anterior cervical disc arthroplasty.  This was predominantly for left upper extremity pain numbness weakness and tingling.  She has since had a improvement in recovery in the left upper extremity.  She continues to have difficulty with the right hand, the seem to be consistent with carpal tunnel syndrome so was sent to nerve conduction study which diagnosed a carpal tunnel syndrome.  She gets nighttime symptoms, daytime symptoms, when she is driving or utilizing her phone.  Also when she is writing she gets a lot of numbness tingling in her hand in the median nerve distribution.  This is causing weakness in her hand and worsening function.  She has been dealing with this for over 6 months.  She feels like she started to notice it much more after her debilitating left upper extremity pain had started to recover.   PHYSICAL EXAMINATION:  NEUROLOGICAL:  General: In no acute distress.   Awake, alert, oriented to person, place, and time.  Pupils equal round and reactive to light.  Facial tone is symmetric.     +CTS tests on the right hand.   Strength:  Side Biceps Triceps Deltoid Interossei Grip Wrist Ext. Wrist Flex.  R 5 5 5 5 5 5 5   L 5 5 5  4+ 4 4 4   She has some weakness noted in her median intrinsic hand muscles, however is not severe amount of wasting.    Incision c/d/I  Imaging:   [facility]  [address] [place], [address]  [place], [place] [address] Tel: [phone] Fax: [phone] Test Date:  03/18/2024   Patient: [name] DOB: Apr 27, 1976 Physician: Tonita Blanch, DO  Sex: Female Height: 5' 2 Ref Phys: Lyle Decamp, DEVONNA  ID#: [id number]     Technician:      History: This is a 48 year old female referred for  evaluation of right hand paresthesias.   NCV & EMG Findings: Extensive electrodiagnostic testing of the right upper extremity shows:  Right median sensory response shows prolonged latency (5.1 ms) and reduced amplitude (9.1 V).  Right ulnar sensory response is within normal limits. Right median motor response shows prolonged latency (4.8 ms).  Right ulnar motor response is within normal limits.  Chronic motor axon loss changes are seen affecting the C7 myotome, without accompanying active denervation.   Impression: Right median neuropathy at or distal to the wrist, consistent with a clinical diagnosis of carpal tunnel syndrome.  Overall, these findings are moderate-to-severe in degree electrically. Chronic right C7 radiculopathy, mild.     ___________________________ Tonita Blanch, DO    Assessment / Plan: [name]  is well-known in our office.  Has a history of C5-C7 anterior cervical disc arthroplasty mostly for left-sided upper extremity pain numbness weakness and tingling.  Since she has had a good recovery in her left upper extremity symptoms.  However she has noticed that her right hand symptoms predominantly have been continuing to worsen.  She has numbness weakness tingling when she is writing, when she is sleeping, when she is driving or utilizing a phone.  She often has to shake out her hand to regain some of the feeling.  She states that she feels like this continues to get worse.  She had a recent EMG  nerve conduction study which demonstrated a moderately severe carpal tunnel syndrome on the right, given her symptoms and progressive worsening as well as greater than 6 months of symptomatology without any improvement we will plan for a right sided carpal tunnel release with ultrasound guidance with possible convert to open.  She like to go forward with the procedure.  Will plan on looking for dates for her procedure.   Penne LELON Sharps MD Dept of Neurosurgery

## 2024-03-23 NOTE — Patient Instructions (Signed)
 Please see below for information in regards to your upcoming surgery:   Planned surgery: Right side carpal tunnel release with ultrasound guidance, possible convert to open    Surgery date: 04/12/24 at [facility] (Medical Mall: [address], [place], [place] [address]) - you will find out your arrival time the business day before your surgery.   Pre-op appointment at [facility] Pre-admit Testing: you will receive a call with a date/time for this appointment. If you are scheduled for an in person appointment, Pre-admit Testing is located on the first floor of the [facility] building, [address] [facility], [address]. During this appointment, they will advise you which medications you can take the morning of surgery, and which medications you will need to hold for surgery. Labs (such as blood work, EKG) may be done at your pre-op appointment. You are not required to fast for these labs. Should you need to change your pre-op appointment, please call Pre-admit testing at [phone].    How to contact us :  If you have any questions/concerns before or after surgery, you can reach us  at [phone], or you can send a mychart message. We can be reached by phone or mychart 8am-4pm, Monday-Friday.  *Please note: Calls after 4pm are forwarded to a third party answering service. Mychart messages are not routinely monitored during evenings, weekends, and holidays. Please call our office to contact the answering service for urgent concerns during non-business hours.   If you have FMLA/disability paperwork, please drop it off or fax it to [phone]   Appointments/FMLA & disability paperwork: [employer] Registered Nurse/Surgery scheduler: Avana Kreiser, RN Certified Medical Assistants: Don, CMA, Elenor, CMA, & Damien, CMA Physician Assistants: Lyle Decamp, PA-C, Edsel Goods, PA-C & Glade Boys, PA-C Surgeons: Penne Sharps, MD & Reeves Daisy,  MD   [facility] PREADMIT TESTING VISIT and SURGERY INFORMATION SHEET   Now that surgery has been scheduled you can anticipate several phone calls from [facility] services. A pharmacy technician will call you to verify your current list of medications taken at home.               The Pre-Service Center will call to verify your insurance information and to give you billing estimates and information.             The Preadmit Testing Office will be calling to schedule a visit to obtain information for the anesthesia team and provide instructions on preparation for surgery.  What can you expect for the Preadmit Testing Visit: Appointments may be scheduled in-person or by telephone.  If a telephone visit is scheduled, you may be asked to come into the office to have lab tests or other studies performed.   This visit will not be completed any greater than 14 days prior to your surgery.  If your surgery has been scheduled for a future date, please do not be alarmed if we have not contacted you to schedule an appointment more than a month prior to the surgery date.    Please be prepared to provide the following information during this appointment:            -Personal medical history                                               -Medication and allergy list            -  Any history of problems with anesthesia              -Recent lab work or diagnostic studies            -Please notify us  of any needs we should be aware of to provide the best care possible           -You will be provided with instructions on how to prepare for your surgery.    On The Day of Surgery:  You must have a driver to take you home after surgery, you will be asked not to drive for 24 hours following surgery.  Taxi, Gisele and non-medical transport will not be acceptable means of transportation unless you have a responsible individual who will be traveling with you.  Visitors in the surgical area:   2  people will be able to visit you in your room once your preparation for surgery has been completed. During surgery, your visitors will be asked to wait in the Surgery Waiting Area.  It is not a requirement for them to stay, if they prefer to leave and come back.  Your visitor(s) will be given an update once the surgery has been completed.  No visitors are allowed in the initial recovery room to respect patient privacy and safety.  Once you are more awake and transfer to the secondary recovery area, or are transferred to an inpatient room, visitors will again be able to see you.  To respect and protect your privacy: We will ask on the day of surgery who your driver will be and what the contact number for that individual will be. We will ask if it is okay to share information with this individual, or if there is an alternative individual that we, or the surgeon, should contact to provide updates and information. If family or friends come to the surgical information desk requesting information about you, who you have not listed with us , no information will be given.   It may be helpful to designate someone as the main contact who will be responsible for updating your other friends and family.    PREADMIT TESTING OFFICE: [phone] SAME DAY SURGERY: [phone] We look forward to caring for you before and throughout the process of your surgery.

## 2024-03-23 NOTE — H&P (View-Only) (Signed)
 REFERRING PHYSICIAN:  Del Wilhelmena Lloyd Sola, Fnp [phone] [address] [place],  [place] [address]  DOS: 11/10/23, C5-7 arthroplasty  HISTORY OF PRESENT ILLNESS: [name] is well-known to our office.  She has a history of C5-C7 anterior cervical disc arthroplasty.  This was predominantly for left upper extremity pain numbness weakness and tingling.  She has since had a improvement in recovery in the left upper extremity.  She continues to have difficulty with the right hand, the seem to be consistent with carpal tunnel syndrome so was sent to nerve conduction study which diagnosed a carpal tunnel syndrome.  She gets nighttime symptoms, daytime symptoms, when she is driving or utilizing her phone.  Also when she is writing she gets a lot of numbness tingling in her hand in the median nerve distribution.  This is causing weakness in her hand and worsening function.  She has been dealing with this for over 6 months.  She feels like she started to notice it much more after her debilitating left upper extremity pain had started to recover.   PHYSICAL EXAMINATION:  NEUROLOGICAL:  General: In no acute distress.   Awake, alert, oriented to person, place, and time.  Pupils equal round and reactive to light.  Facial tone is symmetric.     +CTS tests on the right hand.   Strength:  Side Biceps Triceps Deltoid Interossei Grip Wrist Ext. Wrist Flex.  R 5 5 5 5 5 5 5   L 5 5 5  4+ 4 4 4   She has some weakness noted in her median intrinsic hand muscles, however is not severe amount of wasting.    Incision c/d/I  Imaging:   [facility]  [address] [place], [address]  [place], [place] [address] Tel: [phone] Fax: [phone] Test Date:  03/18/2024   Patient: [name] DOB: Apr 27, 1976 Physician: Tonita Blanch, DO  Sex: Female Height: 5' 2 Ref Phys: Lyle Decamp, DEVONNA  ID#: [id number]     Technician:      History: This is a 48 year old female referred for  evaluation of right hand paresthesias.   NCV & EMG Findings: Extensive electrodiagnostic testing of the right upper extremity shows:  Right median sensory response shows prolonged latency (5.1 ms) and reduced amplitude (9.1 V).  Right ulnar sensory response is within normal limits. Right median motor response shows prolonged latency (4.8 ms).  Right ulnar motor response is within normal limits.  Chronic motor axon loss changes are seen affecting the C7 myotome, without accompanying active denervation.   Impression: Right median neuropathy at or distal to the wrist, consistent with a clinical diagnosis of carpal tunnel syndrome.  Overall, these findings are moderate-to-severe in degree electrically. Chronic right C7 radiculopathy, mild.     ___________________________ Tonita Blanch, DO    Assessment / Plan: [name]  is well-known in our office.  Has a history of C5-C7 anterior cervical disc arthroplasty mostly for left-sided upper extremity pain numbness weakness and tingling.  Since she has had a good recovery in her left upper extremity symptoms.  However she has noticed that her right hand symptoms predominantly have been continuing to worsen.  She has numbness weakness tingling when she is writing, when she is sleeping, when she is driving or utilizing a phone.  She often has to shake out her hand to regain some of the feeling.  She states that she feels like this continues to get worse.  She had a recent EMG  nerve conduction study which demonstrated a moderately severe carpal tunnel syndrome on the right, given her symptoms and progressive worsening as well as greater than 6 months of symptomatology without any improvement we will plan for a right sided carpal tunnel release with ultrasound guidance with possible convert to open.  She like to go forward with the procedure.  Will plan on looking for dates for her procedure.   Penne LELON Sharps MD Dept of Neurosurgery

## 2024-03-27 ENCOUNTER — Other Ambulatory Visit: Payer: Self-pay | Admitting: Neurosurgery

## 2024-03-28 ENCOUNTER — Telehealth: Payer: Self-pay

## 2024-03-28 NOTE — Telephone Encounter (Signed)
 Pt called to let us  know that she was out of her Gemtesa  Rx and needed a PA completed pt was notified the NP no longer works here however we could schedule an appt w/ a new provider and have them resend the PA to have Rx filled and pick up samples in the meantime. Pt stated she has no transportation other than her children and they all work until 5 PM pt also stated no longer wanted to be seen here pt was again offered to be scheduled w/ a new provider but pt again stated she no longer wanted to be seen here

## 2024-03-29 ENCOUNTER — Ambulatory Visit (HOSPITAL_COMMUNITY): Admitting: Physical Therapy

## 2024-03-29 DIAGNOSIS — R6889 Other general symptoms and signs: Secondary | ICD-10-CM

## 2024-03-29 DIAGNOSIS — M542 Cervicalgia: Secondary | ICD-10-CM

## 2024-03-29 DIAGNOSIS — M5382 Other specified dorsopathies, cervical region: Secondary | ICD-10-CM

## 2024-03-29 DIAGNOSIS — R200 Anesthesia of skin: Secondary | ICD-10-CM

## 2024-03-29 NOTE — Therapy (Signed)
 OUTPATIENT PHYSICAL THERAPY CERVICAL TREATMENT   Patient Name: [name] MRN: [id number] DOB:1975/12/14, 48 y.o., female Today's Date: 03/29/2024  END OF SESSION:  PT End of Session - 03/29/24 1306     Visit Number 2    Number of Visits 9    Date for PT Re-Evaluation 04/14/24    Authorization Type [place] Medicaid [employer]    Authorization Time Period 8 visits 9/4-11/2    Authorization - Visit Number 1    Authorization - Number of Visits 8    Progress Note Due on Visit 8    PT Start Time 1302    PT Stop Time 1340    PT Time Calculation (min) 38 min    Activity Tolerance Patient tolerated treatment well    Behavior During Therapy WFL for tasks assessed/performed          Past Medical History:  Diagnosis Date   Anxiety    [employer] 12/30/2019   Asthma    Depression    Dyspnea    GERD (gastroesophageal reflux disease)    Hidradenitis suppurativa    History of hiatal hernia    Hyperlipidemia    Hypertension    IBS (irritable bowel syndrome)    Intervertebral disc disorder with myelopathy, cervical region    OAB (overactive bladder)    Tobacco use    Vitamin B 12 deficiency    Vitamin D  deficiency    Past Surgical History:  Procedure Laterality Date   BIOPSY  05/22/2022   Procedure: BIOPSY;  Surgeon: Eartha Angelia Sieving, MD;  [facility];  Service: Gastroenterology;;   BIOPSY  11/26/2022   Procedure: BIOPSY;  Surgeon: Eartha Angelia Sieving, MD;  Location: [facility];  Service: Gastroenterology;;   CERVICAL ANTERIOR DISC ARTHROPLASTY, 2 LEVEL N/A 11/10/2023   Procedure: CERVICAL ANTERIOR DISC ARTHROPLASTY, 2 LEVEL;  Surgeon: Claudene Penne ORN, MD;  Location: [facility];  Service: Neurosurgery;  Laterality: N/A;   CERVICAL DISC ARTHROPLASTY  11/10/2023   Procedure: CERVICAL ANTERIOR DISC ARTHROPLASTY Levels 5-7;  Surgeon: Claudene Penne ORN, MD;  Location: [facility];  Service: Neurosurgery;;   CHOLECYSTECTOMY     COLONOSCOPY WITH PROPOFOL  N/A  05/22/2022   Procedure: COLONOSCOPY WITH PROPOFOL ;  Surgeon: Eartha Angelia Sieving, MD;  [facility]: [facility];  Service: Gastroenterology;  Laterality: N/A;  1215 ASA 2   ESOPHAGOGASTRODUODENOSCOPY (EGD) WITH PROPOFOL  N/A 11/26/2022   Procedure: ESOPHAGOGASTRODUODENOSCOPY (EGD) WITH PROPOFOL ;  Surgeon: Eartha Angelia Sieving, MD;  Location: [facility];  Service: Gastroenterology;  Laterality: N/A;  1:00PM;asa 2   POLYPECTOMY  05/22/2022   Procedure: POLYPECTOMY;  Surgeon: Eartha Angelia, Sieving, MD;  Location: [facility];  Service: Gastroenterology;;   TUBAL LIGATION     Patient Active Problem List   Diagnosis Date Noted   Carpal tunnel syndrome of right wrist 03/23/2024   Mass of left side of neck 02/12/2024   Leg swelling 02/12/2024   SOB (shortness of breath) 12/03/2023   Cervical radiculopathy 11/10/2023   Intervertebral cervical disc disorder with myelopathy, cervical region 10/21/2023   Left arm weakness 10/21/2023   Hidradenitis suppurativa 08/05/2023   Depression, major, single episode, moderate (HCC) 08/05/2023   Irritable bowel syndrome with constipation 06/09/2023   Mixed stress and urge urinary incontinence 04/07/2023   Neck pain 04/01/2023   Early satiety 03/01/2023   Overactive bladder 11/05/2022   Diarrhea 05/05/2022   Loss of weight 05/05/2022   Vitamin B12 deficiency 04/12/2020   Vitamin D  deficiency 04/12/2020  Dizziness 04/12/2020   HTN (hypertension) 12/30/2019   Tobacco abuse 12/30/2019   GERD (gastroesophageal reflux disease) 12/30/2019   Spondylosis 12/30/2019    PCP: [name], FNP  REFERRING PROVIDER: Ulis Bottcher, PA-C  REFERRING DIAG: [phone] (ICD-10-CM) - Left arm weakness M50.10 (ICD-10-CM) - Cervical disc disorder with radiculopathy of cervical region  THERAPY DIAG:  Cervicalgia  Impaired function of upper extremity  Decreased ROM of intervertebral discs of cervical spine  Numbness and tingling of both  upper extremities  Rationale for Evaluation and Treatment: Rehabilitation  ONSET DATE: April 29th  SUBJECTIVE:                                                                                                                                                                                                         SUBJECTIVE STATEMENT: Pt reports still having pain and soreness in her neck especially with rotation and causes pain to shoot down her arms and gives her headaches.   Evaluation: Patient reports since last episode, she agreed to surgery. Had surgery on cervical spine on April 29th, 2025. Reports previous weakness in LUE is better but now RUE is bad. Goes for carpal tunnel testing tomorrow. Reports her R arm just goes to sleep when she writing. Increased numbness/tingling down UE with cervical flexion but N/T nearly constant.  Hand dominance: Right Reports she recently changed BP mediations, MD put her on a new inhaler, increased dosage of another inhaler and she has discontinued pain medications.   PERTINENT HISTORY:  Chronic cervical pain Cervical Disc Arthroplasty Myelopathy of cervical region    PAIN:  Are you having pain? Yes: NPRS scale: Neck: 6-7/10, arm pain: R 7/10 and L 4/10 Pain location: Neck and BUE Pain description: constant ache pain in neck, N/T down arms  Aggravating factors: cervical flexion and/or rotation Relieving factors: Rest, No motion, heat/hot shower, Gabapentin   PRECAUTIONS: None  RED FLAGS: Numbness tingling down both UE     WEIGHT BEARING RESTRICTIONS: No  FALLS:  Has patient fallen in last 6 months? Yes. Number of falls 1  OCCUPATION: N/A  PLOF: Assisted with iADLs, independent with ADLs mostly but intermittent assist with lower body cleaning due to dizziness, intermittent assist with washing hair due to UE weakness  PATIENT GOALS: To strengthen arms and to try to relieve tension in neck because its so sore  NEXT MD VISIT: September  10th, 2025  OBJECTIVE:  Note: Objective measures were completed at Evaluation unless otherwise noted.  DIAGNOSTIC FINDINGS:  FINDINGS: Normal alignment. C5-C6 and C6-C7 disc arthroplasty in  unchanged alignment. No periprosthetic lucency. Remaining disc spaces are normal. No fracture. No prevertebral soft tissue thickening.   IMPRESSION: C5-C6 and C6-C7 disc arthroplasty without complication.    PATIENT SURVEYS:  Neck Disability Index score: 33 / 50 = 66.0 % Upper Extremity Function Scale: 48 / 80 = 60.0 %    COGNITION: Overall cognitive status: Within functional limits for tasks assessed  SENSATION: Light touch: Impaired  Dec sensation throughout R cerv dermatomes   POSTURE: rounded shoulders and forward head  PALPATION: Very TTP with grade 1 CPA/UPA throughout cervical and upper thoracic spin    CERVICAL ROM:   Active ROM A/PROM (deg) eval  Flexion 15 deg, pain in neck   Extension 30 deg, pain in neck  Right lateral flexion 25, pain in neck  Left lateral flexion 20, pain in neck  Right rotation 30, pain in neck  Left rotation 35, pain in neck and N/T down RLE   (Blank rows = not tested)  UPPER EXTREMITY ROM:  Active ROM Right eval Left eval  Shoulder flexion 120 120  Shoulder extension    Shoulder abduction 120 120  Shoulder adduction    Shoulder extension    Shoulder internal rotation    Shoulder external rotation    Elbow flexion    Elbow extension    Wrist flexion    Wrist extension    Wrist ulnar deviation    Wrist radial deviation    Wrist pronation    Wrist supination     (Blank rows = not tested)  UPPER EXTREMITY MMT:  MMT Right eval Left eval  Shoulder flexion 4- 4-  Shoulder extension    Shoulder abduction 4- 4-  Shoulder adduction    Shoulder extension    Shoulder internal rotation 4- 4-  Shoulder external rotation 4- 4-  Middle trapezius    Lower trapezius    Elbow flexion    Elbow extension    Wrist flexion    Wrist  extension    Wrist ulnar deviation    Wrist radial deviation    Wrist pronation    Wrist supination    Grip strength     (Blank rows = not tested)  CERVICAL SPECIAL TESTS:  Distraction test: Positive Decrease in N/T down RUE  FUNCTIONAL TESTS:    TREATMENT DATE:  03/29/24 Goal and POC review Seated: Levator Scapulae Stretch 2X30 each side  UT stretch 2X30 each side  Shoulder rolls up,back, down 10X  Scapular retraction 10X5  Cervical retraction 10X5 Manual to bil cervical seated    03/17/24: PT Eval and HEP                                                                                                                                 PATIENT EDUCATION:  Education details: PT evaluation, objective findings, POC, Importance of HEP, Precautions, Clinic policies  Person educated: Patient Education method: Explanation and Demonstration Education comprehension: verbalized  understanding and returned demonstration  HOME EXERCISE PROGRAM: Access Code: [email or web address] URL: [email or web address] Date: 03/17/2024 Prepared by: Rosaria Powell-Butler  Exercises - Seated Levator Scapulae Stretch  - 2 x daily - 7 x weekly - 3 sets - 30 hold - Seated Upper Trapezius Stretch  - 2 x daily - 7 x weekly - 3 sets - 30 hold - Seated Scapular Retraction  - 2 x daily - 7 x weekly - 3 sets - 10 reps - Supine Cervical Rotation AROM on Pillow  - 2 x daily - 7 x weekly - 3 sets - 10 reps - Supine Chin Tuck  - 2 x daily - 7 x weekly - 3 sets - 10 reps  03/29/24:  cervical AROM 10X each direction  ASSESSMENT:  CLINICAL IMPRESSION: Reviewed goals and POC moving forward.  Pt able to complete all exercises in good form/control.  Cues for longer hold times with stretches, minor corrections in positioning.  Pt most limited by cervical rotation and lateral flexion.  Able to complete all motions within painfree ROM.  Pt given cervical AROM to add to HEP.  Manual completed for bil upper traps and  scalenes with noted relief and reduction in pain/stiffness. Pt reported no headache and overall pain reduction at end of session today.  Also with slightly improved AROM for rotation at end of session.  Patient will continue to benefit from skilled physical therapy in order to address current deficits in order to improve current level of function, quality of life, and pain reduction.     OBJECTIVE IMPAIRMENTS: decreased mobility, decreased ROM, decreased strength, impaired UE functional use, improper body mechanics, postural dysfunction, and pain.   ACTIVITY LIMITATIONS: carrying, lifting, sleeping, dressing, reach over head, and hygiene/grooming  PARTICIPATION LIMITATIONS: meal prep, cleaning, laundry, driving, community activity, and occupation  PERSONAL FACTORS: Time since onset of injury/illness/exacerbation are also affecting patient's functional outcome.   REHAB POTENTIAL: Good  CLINICAL DECISION MAKING: Stable/uncomplicated  EVALUATION COMPLEXITY: Low   GOALS: Goals reviewed with patient? Yes  SHORT TERM GOALS: Target date: 04/07/24 Patient will be independent with performance of HEP to demonstrate adequate self management of symptoms.  Baseline:  Goal status: IN PROGRESS  2.   Patient will report at least a 25% improvement with function and/or pain reduction overall since beginning PT. Baseline:  Goal status: IN PROGRESS   LONG TERM GOALS: Target date: 04/28/24 Patient will improve NDI score by 18  points to demonstrate improved perceived function. Baseline: See above Goal status: IN PROGRESS  2.  Patient will improve cervical extension ROM to at least 70 degrees to show improved neck mobility for safe performance of functional tasks such as looking upward to higher cabinets.  Baseline: See above Goal status: IN PROGRESS  3.  Patient will improve cervical rotation ROM to at least 70 degrees bilaterally to show improved neck mobility for safe performance of functional  tasks such as driving.  Baseline: See above Goal status: IN PROGRESS  4. Patient will improve shoulder flexion ROM by at least 20 degrees bilaterally in order to demonstrate improved shoulder mobility needed for functional tasks such as reaching overhead or washing hair. Baseline: See above Goal status: IN PROGRESS  5.  Patient will improve shoulder flexion MMT bilaterally by at least 1/2 a grade with reports of decreased pain to show increased UE strength. Baseline: See above Goal status: IN PROGRESS    PLAN:  PT FREQUENCY: 2x/week  PT DURATION: 6 weeks  PLANNED INTERVENTIONS: 02835- PT Re-evaluation,  97110-Therapeutic exercises, 97530- Therapeutic activity, [id number]- Neuromuscular re-education, [phone]- Self Care, 02859- Manual therapy, [phone]- Gait training, [phone]- Electrical stimulation (manual), [id number]- Traction (mechanical), [phone] (1-2 muscles), [id number] (3+ muscles)- Dry Needling, Patient/Family education, Balance training, Stair training, Taping, Joint mobilization, Spinal mobilization, Cryotherapy, and Moist heat  PLAN FOR NEXT SESSION: continue to  prog cervical mobility/strength, address UE mobility/strengthening, manual techniques as needed: STM, distraction, mobilizations   1:07 PM, 03/29/24 Greig KATHEE Fuse, PTA/CLT [facility] Ph: [phone]

## 2024-03-31 ENCOUNTER — Telehealth (HOSPITAL_COMMUNITY): Payer: Self-pay | Admitting: Physical Therapy

## 2024-03-31 ENCOUNTER — Encounter (HOSPITAL_COMMUNITY): Payer: Self-pay

## 2024-03-31 ENCOUNTER — Encounter (HOSPITAL_COMMUNITY): Admitting: Physical Therapy

## 2024-03-31 NOTE — Telephone Encounter (Signed)
 Pt did not show for appt.  Called and left VM regarding missed appt and reminder for next appt.   Greig KATHEE Fuse, PTA/CLT [facility] Ph: [phone]

## 2024-04-01 ENCOUNTER — Ambulatory Visit (HOSPITAL_COMMUNITY)
Admission: RE | Admit: 2024-04-01 | Discharge: 2024-04-01 | Disposition: A | Discharge status: Home / Self Care | Source: Ambulatory Visit | Attending: Pulmonary Disease | Admitting: Pulmonary Disease

## 2024-04-01 DIAGNOSIS — R0602 Shortness of breath: Secondary | ICD-10-CM | POA: Insufficient documentation

## 2024-04-01 DIAGNOSIS — J849 Interstitial pulmonary disease, unspecified: Secondary | ICD-10-CM | POA: Insufficient documentation

## 2024-04-01 DIAGNOSIS — R918 Other nonspecific abnormal finding of lung field: Secondary | ICD-10-CM | POA: Diagnosis not present

## 2024-04-01 DIAGNOSIS — J432 Centrilobular emphysema: Secondary | ICD-10-CM | POA: Diagnosis not present

## 2024-04-05 ENCOUNTER — Encounter (HOSPITAL_COMMUNITY): Payer: Self-pay

## 2024-04-05 ENCOUNTER — Ambulatory Visit (HOSPITAL_COMMUNITY)

## 2024-04-05 DIAGNOSIS — M5382 Other specified dorsopathies, cervical region: Secondary | ICD-10-CM

## 2024-04-05 DIAGNOSIS — M542 Cervicalgia: Secondary | ICD-10-CM | POA: Diagnosis not present

## 2024-04-05 DIAGNOSIS — R202 Paresthesia of skin: Secondary | ICD-10-CM

## 2024-04-05 DIAGNOSIS — R6889 Other general symptoms and signs: Secondary | ICD-10-CM

## 2024-04-05 NOTE — Therapy (Unsigned)
 OUTPATIENT PHYSICAL THERAPY CERVICAL TREATMENT   Patient Name: [name] MRN: [id number] DOB:15-Mar-1976, 48 y.o., female Today's Date: 04/05/2024  END OF SESSION:  PT End of Session - 04/05/24 1253     Visit Number 3    Number of Visits 9    Date for Recertification  04/14/24    Authorization Type [place] Medicaid [employer]    Authorization Time Period 8 visits 9/4-11/2    Authorization - Visit Number 2    Authorization - Number of Visits 8    Progress Note Due on Visit 8    PT Start Time 1251    PT Stop Time 1332    PT Time Calculation (min) 41 min    Activity Tolerance Patient tolerated treatment well    Behavior During Therapy [facility] for tasks assessed/performed          Past Medical History:  Diagnosis Date   Anxiety    [employer]ia 12/30/2019   Asthma    Depression    Dyspnea    GERD (gastroesophageal reflux disease)    Hidradenitis suppurativa    History of hiatal hernia    Hyperlipidemia    Hypertension    IBS (irritable bowel syndrome)    Intervertebral disc disorder with myelopathy, cervical region    OAB (overactive bladder)    Tobacco use    Vitamin B 12 deficiency    Vitamin D  deficiency    Past Surgical History:  Procedure Laterality Date   BIOPSY  05/22/2022   Procedure: BIOPSY;  Surgeon: Eartha Angelia Sieving, MD;  [facility];  Service: Gastroenterology;;   BIOPSY  11/26/2022   Procedure: BIOPSY;  Surgeon: Eartha Angelia Sieving, MD;  Location: [facility];  Service: Gastroenterology;;   CERVICAL ANTERIOR DISC ARTHROPLASTY, 2 LEVEL N/A 11/10/2023   Procedure: CERVICAL ANTERIOR DISC ARTHROPLASTY, 2 LEVEL;  Surgeon: Claudene Penne ORN, MD;  Location: [facility];  Service: Neurosurgery;  Laterality: N/A;   CERVICAL DISC ARTHROPLASTY  11/10/2023   Procedure: CERVICAL ANTERIOR DISC ARTHROPLASTY Levels 5-7;  Surgeon: Claudene Penne ORN, MD;  Location: [facility];  Service: Neurosurgery;;   CHOLECYSTECTOMY     COLONOSCOPY WITH PROPOFOL  N/A  05/22/2022   Procedure: COLONOSCOPY WITH PROPOFOL ;  Surgeon: Eartha Angelia Sieving, MD;  [facility]: [facility];  Service: Gastroenterology;  Laterality: N/A;  1215 ASA 2   ESOPHAGOGASTRODUODENOSCOPY (EGD) WITH PROPOFOL  N/A 11/26/2022   Procedure: ESOPHAGOGASTRODUODENOSCOPY (EGD) WITH PROPOFOL ;  Surgeon: Eartha Angelia Sieving, MD;  Location: [facility];  Service: Gastroenterology;  Laterality: N/A;  1:00PM;asa 2   POLYPECTOMY  05/22/2022   Procedure: POLYPECTOMY;  Surgeon: Eartha Angelia, Sieving, MD;  Location: [facility];  Service: Gastroenterology;;   TUBAL LIGATION     Patient Active Problem List   Diagnosis Date Noted   Carpal tunnel syndrome of right wrist 03/23/2024   Mass of left side of neck 02/12/2024   Leg swelling 02/12/2024   SOB (shortness of breath) 12/03/2023   Cervical radiculopathy 11/10/2023   Intervertebral cervical disc disorder with myelopathy, cervical region 10/21/2023   Left arm weakness 10/21/2023   Hidradenitis suppurativa 08/05/2023   Depression, major, single episode, moderate (HCC) 08/05/2023   Irritable bowel syndrome with constipation 06/09/2023   Mixed stress and urge urinary incontinence 04/07/2023   Neck pain 04/01/2023   Early satiety 03/01/2023   Overactive bladder 11/05/2022   Diarrhea 05/05/2022   Loss of weight 05/05/2022   Vitamin B12 deficiency 04/12/2020   Vitamin D  deficiency 04/12/2020  Dizziness 04/12/2020   HTN (hypertension) 12/30/2019   Tobacco abuse 12/30/2019   GERD (gastroesophageal reflux disease) 12/30/2019   Spondylosis 12/30/2019    PCP: [name], FNP  REFERRING PROVIDER: Ulis Bottcher, PA-C  REFERRING DIAG: [phone] (ICD-10-CM) - Left arm weakness M50.10 (ICD-10-CM) - Cervical disc disorder with radiculopathy of cervical region  THERAPY DIAG:  Cervicalgia  Impaired function of upper extremity  Decreased ROM of intervertebral discs of cervical spine  Numbness and tingling of both  upper extremities  Rationale for Evaluation and Treatment: Rehabilitation  ONSET DATE: April 29th  SUBJECTIVE:                                                                                                                                                                                                         SUBJECTIVE STATEMENT: Pt reporting increased pain down RUE, at 8/10 today. Missed last appointment due to car issues. Reports HEP going well. Reports deep massage was helpful during last session but was very sore for a few days after and could not turn her head.     Evaluation: Patient reports since last episode, she agreed to surgery. Had surgery on cervical spine on April 29th, 2025. Reports previous weakness in LUE is better but now RUE is bad. Goes for carpal tunnel testing tomorrow. Reports her R arm just goes to sleep when she writing. Increased numbness/tingling down UE with cervical flexion but N/T nearly constant.  Hand dominance: Right Reports she recently changed BP mediations, MD put her on a new inhaler, increased dosage of another inhaler and she has discontinued pain medications.   PERTINENT HISTORY:  Chronic cervical pain Cervical Disc Arthroplasty Myelopathy of cervical region    PAIN:  Are you having pain? Yes: NPRS scale: Neck: 6-7/10, arm pain: R 7/10 and L 4/10 Pain location: Neck and BUE Pain description: constant ache pain in neck, N/T down arms  Aggravating factors: cervical flexion and/or rotation Relieving factors: Rest, No motion, heat/hot shower, Gabapentin   PRECAUTIONS: None  RED FLAGS: Numbness tingling down both UE     WEIGHT BEARING RESTRICTIONS: No  FALLS:  Has patient fallen in last 6 months? Yes. Number of falls 1  OCCUPATION: N/A  PLOF: Assisted with iADLs, independent with ADLs mostly but intermittent assist with lower body cleaning due to dizziness, intermittent assist with washing hair due to UE weakness  PATIENT GOALS: To  strengthen arms and to try to relieve tension in neck because its so sore  NEXT MD VISIT: September 10th, 2025  OBJECTIVE:  Note: Objective measures  were completed at Evaluation unless otherwise noted.  DIAGNOSTIC FINDINGS:  FINDINGS: Normal alignment. C5-C6 and C6-C7 disc arthroplasty in unchanged alignment. No periprosthetic lucency. Remaining disc spaces are normal. No fracture. No prevertebral soft tissue thickening.   IMPRESSION: C5-C6 and C6-C7 disc arthroplasty without complication.    PATIENT SURVEYS:  Neck Disability Index score: 33 / 50 = 66.0 % Upper Extremity Function Scale: 48 / 80 = 60.0 %    COGNITION: Overall cognitive status: Within functional limits for tasks assessed  SENSATION: Light touch: Impaired  Dec sensation throughout R cerv dermatomes   POSTURE: rounded shoulders and forward head  PALPATION: Very TTP with grade 1 CPA/UPA throughout cervical and upper thoracic spin    CERVICAL ROM:   Active ROM A/PROM (deg) eval  Flexion 15 deg, pain in neck   Extension 30 deg, pain in neck  Right lateral flexion 25, pain in neck  Left lateral flexion 20, pain in neck  Right rotation 30, pain in neck  Left rotation 35, pain in neck and N/T down RLE   (Blank rows = not tested)  UPPER EXTREMITY ROM:  Active ROM Right eval Left eval  Shoulder flexion 120 120  Shoulder extension    Shoulder abduction 120 120  Shoulder adduction    Shoulder extension    Shoulder internal rotation    Shoulder external rotation    Elbow flexion    Elbow extension    Wrist flexion    Wrist extension    Wrist ulnar deviation    Wrist radial deviation    Wrist pronation    Wrist supination     (Blank rows = not tested)  UPPER EXTREMITY MMT:  MMT Right eval Left eval  Shoulder flexion 4- 4-  Shoulder extension    Shoulder abduction 4- 4-  Shoulder adduction    Shoulder extension    Shoulder internal rotation 4- 4-  Shoulder external rotation 4- 4-   Middle trapezius    Lower trapezius    Elbow flexion    Elbow extension    Wrist flexion    Wrist extension    Wrist ulnar deviation    Wrist radial deviation    Wrist pronation    Wrist supination    Grip strength     (Blank rows = not tested)  CERVICAL SPECIAL TESTS:  Distraction test: Positive Decrease in N/T down RUE  FUNCTIONAL TESTS:    TREATMENT DATE:  04/05/24: PAMA and MHP donned to cervical musculature, 8' MHP donned during AROM:  Rotation, 2'  Chin tuck + extension, 2' Chin tuck + flexion, 2' Supine, lateral flexion with MHP donned, 3' Manual Cervical distraction, 10-15 holds, no change in RUE sensations Seated scapular retraction, 10, 5x Seated TA contraction + posterior shoulder rolls, 1'   03/29/24 Goal and POC review Seated: Levator Scapulae Stretch 2X30 each side  UT stretch 2X30 each side  Shoulder rolls up,back, down 10X  Scapular retraction 10X5  Cervical retraction 10X5 Manual to bil cervical seated    03/17/24: PT Eval and HEP  PATIENT EDUCATION:  Education details: PT evaluation, objective findings, POC, Importance of HEP, Precautions, Clinic policies  Person educated: Patient Education method: Explanation and Demonstration Education comprehension: verbalized understanding and returned demonstration  HOME EXERCISE PROGRAM: Access Code: [email or web address] URL: [email or web address] Date: 03/17/2024 Prepared by: Rosaria Powell-Butler  Exercises - Seated Levator Scapulae Stretch  - 2 x daily - 7 x weekly - 3 sets - 30 hold - Seated Upper Trapezius Stretch  - 2 x daily - 7 x weekly - 3 sets - 30 hold - Seated Scapular Retraction  - 2 x daily - 7 x weekly - 3 sets - 10 reps - Supine Cervical Rotation AROM on Pillow  - 2 x daily - 7 x weekly - 3 sets - 10 reps - Supine Chin Tuck  - 2 x daily - 7 x weekly - 3  sets - 10 reps  03/29/24:  cervical AROM 10X each direction  ASSESSMENT:  CLINICAL IMPRESSION: Patient continues to have   Reviewed goals and POC moving forward.  Pt able to complete all exercises in good form/control.  Cues for longer hold times with stretches, minor corrections in positioning.  Pt most limited by cervical rotation and lateral flexion.  Able to complete all motions within painfree ROM.  Pt given cervical AROM to add to HEP.  Manual completed for bil upper traps and scalenes with noted relief and reduction in pain/stiffness. Pt reported no headache and overall pain reduction at end of session today.  Also with slightly improved AROM for rotation at end of session.  Patient will continue to benefit from skilled physical therapy in order to address current deficits in order to improve current level of function, quality of life, and pain reduction.     OBJECTIVE IMPAIRMENTS: decreased mobility, decreased ROM, decreased strength, impaired UE functional use, improper body mechanics, postural dysfunction, and pain.   ACTIVITY LIMITATIONS: carrying, lifting, sleeping, dressing, reach over head, and hygiene/grooming  PARTICIPATION LIMITATIONS: meal prep, cleaning, laundry, driving, community activity, and occupation  PERSONAL FACTORS: Time since onset of injury/illness/exacerbation are also affecting patient's functional outcome.   REHAB POTENTIAL: Good  CLINICAL DECISION MAKING: Stable/uncomplicated  EVALUATION COMPLEXITY: Low   GOALS: Goals reviewed with patient? Yes  SHORT TERM GOALS: Target date: 04/07/24 Patient will be independent with performance of HEP to demonstrate adequate self management of symptoms.  Baseline:  Goal status: IN PROGRESS  2.   Patient will report at least a 25% improvement with function and/or pain reduction overall since beginning PT. Baseline:  Goal status: IN PROGRESS   LONG TERM GOALS: Target date: 04/28/24 Patient will improve NDI score  by 18  points to demonstrate improved perceived function. Baseline: See above Goal status: IN PROGRESS  2.  Patient will improve cervical extension ROM to at least 70 degrees to show improved neck mobility for safe performance of functional tasks such as looking upward to higher cabinets.  Baseline: See above Goal status: IN PROGRESS  3.  Patient will improve cervical rotation ROM to at least 70 degrees bilaterally to show improved neck mobility for safe performance of functional tasks such as driving.  Baseline: See above Goal status: IN PROGRESS  4. Patient will improve shoulder flexion ROM by at least 20 degrees bilaterally in order to demonstrate improved shoulder mobility needed for functional tasks such as reaching overhead or washing hair. Baseline: See above Goal status: IN PROGRESS  5.  Patient will improve shoulder flexion MMT bilaterally by at least 1/2 a grade with reports  of decreased pain to show increased UE strength. Baseline: See above Goal status: IN PROGRESS    PLAN:  PT FREQUENCY: 2x/week  PT DURATION: 6 weeks  PLANNED INTERVENTIONS: 97164- PT Re-evaluation, 97110-Therapeutic exercises, 97530- Therapeutic activity, [id number]- Neuromuscular re-education, 97535- Self Care, [id number]- Manual therapy, [id number]- Gait training, [phone]- Electrical stimulation (manual), [id number]- Traction (mechanical), [phone] (1-2 muscles), [id number] (3+ muscles)- Dry Needling, Patient/Family education, Balance training, Stair training, Taping, Joint mobilization, Spinal mobilization, Cryotherapy, and Moist heat  PLAN FOR NEXT SESSION: continue to  prog cervical mobility/strength, address UE mobility/strengthening, manual techniques as needed: STM, distraction, mobilizations   3:22 PM, 04/05/24 Paula Busenbark Powell-Butler, PT, DPT [facility] [place]

## 2024-04-06 ENCOUNTER — Telehealth: Admitting: Family Medicine

## 2024-04-06 ENCOUNTER — Encounter
Admission: RE | Admit: 2024-04-06 | Discharge: 2024-04-06 | Disposition: A | Discharge status: Home / Self Care | Source: Ambulatory Visit | Attending: Neurosurgery | Admitting: Neurosurgery

## 2024-04-06 ENCOUNTER — Other Ambulatory Visit: Payer: Self-pay

## 2024-04-06 VITALS — Ht 62.0 in | Wt 220.0 lb

## 2024-04-06 DIAGNOSIS — I10 Essential (primary) hypertension: Secondary | ICD-10-CM

## 2024-04-06 DIAGNOSIS — R0602 Shortness of breath: Secondary | ICD-10-CM

## 2024-04-06 HISTORY — DX: Chronic obstructive pulmonary disease, unspecified: J44.9

## 2024-04-06 NOTE — Progress Notes (Signed)
 Virtual Visit via Video Note  I connected with [name] on 04/06/24 at  8:20 AM EDT by a video enabled telemedicine application and verified that I am speaking with the correct person using two identifiers.  Patient Location: Home Provider Location: Office/Clinic  I discussed the limitations, risks, security, and privacy concerns of performing an evaluation and management service by video and the availability of in person appointments. I also discussed with the patient that there may be a patient responsible charge related to this service. The patient expressed understanding and agreed to proceed.  Subjective: PCP: [name], FNP  No chief complaint on file.  HPI Patient presents via telehealth for chronic follow up. For the details of today's visit, please refer to assessment and plan.    ROS: Per HPI  Current Outpatient Medications:    albuterol  (VENTOLIN  HFA) 108 (90 Base) MCG/ACT inhaler, Inhale 2 puffs into the lungs every 6 (six) hours as needed for wheezing or shortness of breath., Disp: 8 g, Rfl: 2   Cholecalciferol  (VITAMIN D ) 125 MCG (5000 UT) CAPS, Take 5,000 Units by mouth daily., Disp: , Rfl:    cyclobenzaprine  (FLEXERIL ) 10 MG tablet, Take 1 tablet by mouth three times daily as needed for muscle spasm, Disp: 90 tablet, Rfl: 0   escitalopram  (LEXAPRO ) 10 MG tablet, Take 1 tablet by mouth once daily, Disp: 30 tablet, Rfl: 0   furosemide  (LASIX ) 20 MG tablet, Take 1 tablet (20 mg total) by mouth as needed for up to 1 dose for edema or fluid. For Leg swelling, Disp: 30 tablet, Rfl: 3   gabapentin  (NEURONTIN ) 300 MG capsule, TAKE 2 CAPSULES BY MOUTH THREE TIMES DAILY, Disp: 180 capsule, Rfl: 0   linaclotide  (LINZESS ) 145 MCG CAPS capsule, Take 1 capsule (145 mcg total) by mouth daily., Disp: 30 capsule, Rfl: 5   metoprolol  tartrate (LOPRESSOR ) 50 MG tablet, Take 1 tablet by mouth twice daily, Disp: 90 tablet, Rfl: 0   olmesartan  (BENICAR ) 20 MG  tablet, Take 0.5 tablets (10 mg total) by mouth daily., Disp: 30 tablet, Rfl: 2   Omega-3 Fatty Acids (FISH OIL ) 1000 MG CAPS, Take 2 capsules (2,000 mg total) by mouth in the morning and at bedtime., Disp: 90 capsule, Rfl: 3   omeprazole  (PRILOSEC) 40 MG capsule, Take 1 capsule (40 mg total) by mouth daily., Disp: 60 capsule, Rfl: 3   ondansetron  (ZOFRAN ) 4 MG tablet, TAKE 1 TABLET BY MOUTH EVERY 8 HOURS AS NEEDED FOR NAUSEA OR VOMITING, Disp: 60 tablet, Rfl: 1   oxybutynin  (DITROPAN -XL) 10 MG 24 hr tablet, Take 10 mg by mouth at bedtime., Disp: , Rfl:    oxyCODONE  (OXY IR/ROXICODONE ) 5 MG immediate release tablet, Take 1 tablet (5 mg total) by mouth every 6 (six) hours as needed for severe pain (pain score 7-10). (Patient not taking: Reported on 04/04/2024), Disp: 30 tablet, Rfl: 0   rosuvastatin  (CRESTOR ) 5 MG tablet, Take 1 tablet (5 mg total) by mouth daily., Disp: 90 tablet, Rfl: 3   senna (SENOKOT) 8.6 MG TABS tablet, Take 1 tablet (8.6 mg total) by mouth 2 (two) times daily. (Patient not taking: Reported on 04/04/2024), Disp: 120 tablet, Rfl: 0   SYMBICORT  160-4.5 MCG/ACT inhaler, Inhale 2 puffs into the lungs 2 (two) times daily., Disp: 10.2 g, Rfl: 12   Tiotropium Bromide Monohydrate  (SPIRIVA  RESPIMAT) 2.5 MCG/ACT AERS, Inhale 2 puffs into the lungs daily., Disp: 4 g, Rfl: 11   Vibegron  (GEMTESA ) 75 MG TABS, Take  1 tablet (75 mg total) by mouth daily., Disp: 30 tablet, Rfl: 11   vitamin B-12 (CYANOCOBALAMIN ) 1000 MCG tablet, Take 1 tablet (1,000 mcg total) by mouth daily., Disp: 60 tablet, Rfl: 1  Observations/Objective: There were no vitals filed for this visit. Physical Exam Patient is alert and no acute distress noted.   Assessment and Plan: Primary hypertension Assessment & Plan: At home blood pressure reading: 132/76 Continue Metoprolol  50 mg twice daily and Olmesartan  10 mg once daily Labs ordered. Continued discussion on DASH diet, low sodium diet and maintain a exercise  routine for 150 minutes per week.   Orders: -     Lipid panel -     BMP8+eGFR -     CBC with Differential/Platelet    Follow Up Instructions: No follow-ups on file.   I discussed the assessment and treatment plan with the patient. The patient was provided an opportunity to ask questions, and all were answered. The patient agreed with the plan and demonstrated an understanding of the instructions.   The patient was advised to call back or seek an in-person evaluation if the symptoms worsen or if the condition fails to improve as anticipated.  The above assessment and management plan was discussed with the patient. The patient verbalized understanding of and has agreed to the management plan.   Bridget Kidd Wilhelmena Falter, FNP

## 2024-04-06 NOTE — Patient Instructions (Addendum)
 Your procedure is scheduled on: Tuesday 04/12/24 Report to the Registration Desk on the 1st floor of the [facility]. To find out your arrival time, please call [phone] between 1PM - 3PM on: Monday 04/11/24 If your arrival time is 6:00 am, do not arrive before that time as the [facility] entrance doors do not open until 6:00 am.  REMEMBER: Instructions that are not followed completely may result in serious medical risk, up to and including death; or upon the discretion of your surgeon and anesthesiologist your surgery may need to be rescheduled.  Do not eat food after midnight the night before surgery.  No gum chewing or hard candies.  You may however, drink CLEAR liquids up to 2 hours before you are scheduled to arrive for your surgery. Do not drink anything within 2 hours of your scheduled arrival time.  Clear liquids include: - water   - apple juice without pulp - black coffee or tea (Do NOT add milk or creamers to the coffee or tea) Do NOT drink anything that is not on this list.   One week prior to surgery: Stop Anti-inflammatories (NSAIDS) such as Advil , Aleve , Ibuprofen , Motrin , Naproxen , Naprosyn  and Aspirin based products such as Excedrin, Goody's Powder, BC Powder. Stop ANY OVER THE COUNTER supplements until after surgery. Omega-3 Fatty Acids (FISH OIL )  vitamin B-12  Cholecalciferol  (VITAMIN D )   You may however, continue to take Tylenol  if needed for pain up until the day of surgery.  Continue taking all of your other prescription medications up until the day of surgery.  ON THE DAY OF SURGERY ONLY TAKE THESE MEDICATIONS WITH SIPS OF WATER :  escitalopram  (LEXAPRO ) 10 MG  gabapentin  (NEURONTIN ) 300 MG  metoprolol  tartrate (LOPRESSOR ) 50 MG  omeprazole  (PRILOSEC) 40 MG   Use inhalers on the day of surgery and bring to the hospital. albuterol  (VENTOLIN  HFA) 108 (90 Base) MCG/ACT inhaler  SYMBICORT  160-4.5 MCG/ACT inhaler  Tiotropium Bromide Monohydrate   (SPIRIVA  RESPIMAT) 2.5 MCG/ACT AERS   No Alcohol for 24 hours before or after surgery.  No Smoking including e-cigarettes for 24 hours before surgery.  No chewable tobacco products for at least 6 hours before surgery.  No nicotine patches on the day of surgery.  Do not use any recreational drugs for at least a week (preferably 2 weeks) before your surgery.  Please be advised that the combination of cocaine and anesthesia may have negative outcomes, up to and including death. If you test positive for cocaine, your surgery will be cancelled.  On the morning of surgery brush your teeth with toothpaste and water , you may rinse your mouth with mouthwash if you wish. Do not swallow any toothpaste or mouthwash.  Use CHG Soap or wipes as directed on instruction sheet.  Do not wear jewelry, make-up, hairpins, clips or nail polish.  For welded (permanent) jewelry: bracelets, anklets, waist bands, etc.  Please have this removed prior to surgery.  If it is not removed, there is a chance that hospital personnel will need to cut it off on the day of surgery.  Do not wear lotions, powders, or perfumes.   Do not shave body hair from the neck down 48 hours before surgery.  Contact lenses, hearing aids and dentures may not be worn into surgery.  Do not bring valuables to the hospital. [facility] is not responsible for any missing/lost belongings or valuables.   Notify your doctor if there is any change in your medical condition (cold, fever, infection).  Wear  comfortable clothing (specific to your surgery type) to the hospital.  After surgery, you can help prevent lung complications by doing breathing exercises.  Take deep breaths and cough every 1-2 hours. Your doctor may order a device called an Incentive Spirometer to help you take deep breaths. When coughing or sneezing, hold a pillow firmly against your incision with both hands. This is called "splinting." Doing this helps protect your  incision. It also decreases belly discomfort.  If you are being admitted to the hospital overnight, leave your suitcase in the car. After surgery it may be brought to your room.  In case of increased patient census, it may be necessary for you, the patient, to continue your postoperative care in the Same Day Surgery department.  If you are being discharged the day of surgery, you will not be allowed to drive home. You will need a responsible individual to drive you home and stay with you for 24 hours after surgery.   If you are taking public transportation, you will need to have a responsible individual with you.  Please call the Pre-admissions Testing Dept. at [phone] if you have any questions about these instructions.  Surgery Visitation Policy:  Patients having surgery or a procedure may have two visitors.  Children under the age of 59 must have an adult with them who is not the patient.  Inpatient Visitation:    Visiting hours are 7 a.m. to 8 p.m. Up to four visitors are allowed at one time in a patient room. The visitors may rotate out with other people during the day.  One visitor age 63 or older may stay with the patient overnight and must be in the room by 8 p.m.   Merchandiser, retail to address health-related social needs:  [email or web address]                                                                                                             Preparing for Surgery with CHLORHEXIDINE  GLUCONATE (CHG) Soap  Chlorhexidine  Gluconate (CHG) Soap  o An antiseptic cleaner that kills germs and bonds with the skin to continue killing germs even after washing  o Used for showering the night before surgery and morning of surgery  Before surgery, you can play an important role by reducing the number of germs on your skin.  CHG (Chlorhexidine  gluconate) soap is an antiseptic cleanser which kills germs and bonds with the skin to continue killing  germs even after washing.  Please do not use if you have an allergy to CHG or antibacterial soaps. If your skin becomes reddened/irritated stop using the CHG.  1. Shower the NIGHT BEFORE SURGERY and the MORNING OF SURGERY with CHG soap.  2. If you choose to wash your hair, wash your hair first as usual with your normal shampoo.  3. After shampooing, rinse your hair and body thoroughly to remove the shampoo.  4. Use CHG as you would any other liquid soap. You can apply CHG directly to the skin and wash gently with a  scrungie or a clean washcloth.  5. Apply the CHG soap to your body only from the neck down. Do not use on open wounds or open sores. Avoid contact with your eyes, ears, mouth, and genitals (private parts). Wash face and genitals (private parts) with your normal soap.  6. Wash thoroughly, paying special attention to the area where your surgery will be performed.  7. Thoroughly rinse your body with warm water .  8. Do not shower/wash with your normal soap after using and rinsing off the CHG soap.  9. Pat yourself dry with a clean towel.  10. Wear clean pajamas to bed the night before surgery.  12. Place clean sheets on your bed the night of your first shower and do not sleep with pets.  13. Shower again with the CHG soap on the day of surgery prior to arriving at the hospital.  14. Do not apply any deodorants/lotions/powders.  15. Please wear clean clothes to the hospital.

## 2024-04-06 NOTE — Assessment & Plan Note (Signed)
 At home blood pressure reading: 132/76 Continue Metoprolol  50 mg twice daily and Olmesartan  10 mg once daily Labs ordered. Continued discussion on DASH diet, low sodium diet and maintain a exercise routine for 150 minutes per week.

## 2024-04-07 ENCOUNTER — Ambulatory Visit: Payer: Self-pay | Admitting: Pulmonary Disease

## 2024-04-07 ENCOUNTER — Encounter
Admission: RE | Admit: 2024-04-07 | Discharge: 2024-04-07 | Disposition: A | Discharge status: Home / Self Care | Source: Ambulatory Visit | Attending: Neurosurgery | Admitting: Neurosurgery

## 2024-04-07 DIAGNOSIS — R0602 Shortness of breath: Secondary | ICD-10-CM | POA: Diagnosis not present

## 2024-04-07 DIAGNOSIS — Z01812 Encounter for preprocedural laboratory examination: Secondary | ICD-10-CM | POA: Insufficient documentation

## 2024-04-07 DIAGNOSIS — I10 Essential (primary) hypertension: Secondary | ICD-10-CM | POA: Insufficient documentation

## 2024-04-07 LAB — BASIC METABOLIC PANEL WITH GFR
Anion gap: 9 (ref 5–15)
BUN: 7 mg/dL (ref 6–20)
CO2: 27 mmol/L (ref 22–32)
Calcium: 8.9 mg/dL (ref 8.9–10.3)
Chloride: 102 mmol/L (ref 98–111)
Creatinine, Ser: 0.82 mg/dL (ref 0.44–1.00)
GFR, Estimated: >60 mL/min (ref >60)
Glucose, Bld: 80 mg/dL (ref 70–99)
Potassium: 4 mmol/L (ref 3.5–5.1)
Sodium: 138 mmol/L (ref 135–145)

## 2024-04-08 ENCOUNTER — Ambulatory Visit (HOSPITAL_COMMUNITY)

## 2024-04-08 ENCOUNTER — Encounter (HOSPITAL_COMMUNITY): Payer: Self-pay

## 2024-04-08 DIAGNOSIS — R200 Anesthesia of skin: Secondary | ICD-10-CM

## 2024-04-08 DIAGNOSIS — M542 Cervicalgia: Secondary | ICD-10-CM | POA: Diagnosis not present

## 2024-04-08 DIAGNOSIS — M5382 Other specified dorsopathies, cervical region: Secondary | ICD-10-CM

## 2024-04-08 DIAGNOSIS — R6889 Other general symptoms and signs: Secondary | ICD-10-CM

## 2024-04-08 NOTE — Therapy (Signed)
 OUTPATIENT PHYSICAL THERAPY CERVICAL TREATMENT   Patient Name: [name] MRN: [id number] DOB:28-Sep-1975, 48 y.o., female Today's Date: 04/08/2024  END OF SESSION:  PT End of Session - 04/08/24 1335     Visit Number 4    Number of Visits 9    Date for Recertification  04/14/24    Authorization Type [place] [employer]    Authorization Time Period 8 visits 9/4-11/2    Authorization - Visit Number 3    Authorization - Number of Visits 8    Progress Note Due on Visit 8    PT Start Time 1335    PT Stop Time 1413    PT Time Calculation (min) 38 min    Activity Tolerance Patient tolerated treatment well    Behavior During Therapy [facility] for tasks assessed/performed           Past Medical History:  Diagnosis Date   Anxiety    [employer]rees[employer] 12/30/2019   Asthma    COPD (chronic obstructive pulmonary disease) (HCC)    Depression    Dyspnea    GERD (gastroesophageal reflux disease)    Hidradenitis suppurativa    History of hiatal hernia    Hyperlipidemia    Hypertension    IBS (irritable bowel syndrome)    Intervertebral disc disorder with myelopathy, cervical region    OAB (overactive bladder)    Tobacco use    Vitamin B 12 deficiency    Vitamin D  deficiency    Past Surgical History:  Procedure Laterality Date   BIOPSY  05/22/2022   Procedure: BIOPSY;  Surgeon: Eartha Angelia Sieving, MD;  [facility];  Service: Gastroenterology;;   BIOPSY  11/26/2022   Procedure: BIOPSY;  Surgeon: Eartha Angelia Sieving, MD;  [facility];  Service: Gastroenterology;;   CERVICAL ANTERIOR DISC ARTHROPLASTY, 2 LEVEL N/A 11/10/2023   Procedure: CERVICAL ANTERIOR DISC ARTHROPLASTY, 2 LEVEL;  Surgeon: Claudene Penne ORN, MD;  Location: [facility];  Service: Neurosurgery;  Laterality: N/A;   CERVICAL DISC ARTHROPLASTY  11/10/2023   Procedure: CERVICAL ANTERIOR DISC ARTHROPLASTY Levels 5-7;  Surgeon: Claudene Penne ORN, MD;  Location: [facility];  Service:  Neurosurgery;;   CHOLECYSTECTOMY     COLONOSCOPY WITH PROPOFOL  N/A 05/22/2022   Procedure: COLONOSCOPY WITH PROPOFOL ;  Surgeon: Eartha Angelia Sieving, MD;  [facility];  Service: Gastroenterology;  Laterality: N/A;  1215 ASA 2   ESOPHAGOGASTRODUODENOSCOPY (EGD) WITH PROPOFOL  N/A 11/26/2022   Procedure: ESOPHAGOGASTRODUODENOSCOPY (EGD) WITH PROPOFOL ;  Surgeon: Eartha Angelia Sieving, MD;  [facility]: [facility];  Service: Gastroenterology;  Laterality: N/A;  1:00PM;asa 2   POLYPECTOMY  05/22/2022   Procedure: POLYPECTOMY;  Surgeon: Eartha Angelia, Sieving, MD;  Location: [facility];  Service: Gastroenterology;;   TUBAL LIGATION     Patient Active Problem List   Diagnosis Date Noted   Carpal tunnel syndrome of right wrist 03/23/2024   Mass of left side of neck 02/12/2024   Leg swelling 02/12/2024   SOB (shortness of breath) 12/03/2023   Cervical radiculopathy 11/10/2023   Intervertebral cervical disc disorder with myelopathy, cervical region 10/21/2023   Left arm weakness 10/21/2023   Hidradenitis suppurativa 08/05/2023   Depression, major, single episode, moderate (HCC) 08/05/2023   Irritable bowel syndrome with constipation 06/09/2023   Mixed stress and urge urinary incontinence 04/07/2023   Neck pain 04/01/2023   Early satiety 03/01/2023   Overactive bladder 11/05/2022   Diarrhea 05/05/2022   Loss of weight 05/05/2022   Vitamin B12  deficiency 04/12/2020   Vitamin D  deficiency 04/12/2020   Dizziness 04/12/2020   HTN (hypertension) 12/30/2019   Tobacco abuse 12/30/2019   GERD (gastroesophageal reflux disease) 12/30/2019   Spondylosis 12/30/2019    PCP: [name], FNP  REFERRING PROVIDER: Ulis Bottcher, PA-C  REFERRING DIAG: [id number] (ICD-10-CM) - Left arm weakness M50.10 (ICD-10-CM) - Cervical disc disorder with radiculopathy of cervical region  THERAPY DIAG:  Cervicalgia  Impaired function of upper extremity  Decreased ROM of  intervertebral discs of cervical spine  Numbness and tingling of both upper extremities  Rationale for Evaluation and Treatment: Rehabilitation  ONSET DATE: April 29th  SUBJECTIVE:                                                                                                                                                                                                         SUBJECTIVE STATEMENT: Pt states that the neck is still very sore up to the base of the skull, thinks she slept on it wrong. Pt reports they are doing the surgery on R carpal tunnel this upcoming Tuesday. Pain in neck and Ues reported 8/10 this date.    Evaluation: Patient reports since last episode, she agreed to surgery. Had surgery on cervical spine on April 29th, 2025. Reports previous weakness in LUE is better but now RUE is bad. Goes for carpal tunnel testing tomorrow. Reports her R arm just goes to sleep when she writing. Increased numbness/tingling down UE with cervical flexion but N/T nearly constant.  Hand dominance: Right Reports she recently changed BP mediations, MD put her on a new inhaler, increased dosage of another inhaler and she has discontinued pain medications.   PERTINENT HISTORY:  Chronic cervical pain Cervical Disc Arthroplasty Myelopathy of cervical region    PAIN:  Are you having pain? Yes: NPRS scale: Neck: 6-7/10, arm pain: R 7/10 and L 4/10 Pain location: Neck and BUE Pain description: constant ache pain in neck, N/T down arms  Aggravating factors: cervical flexion and/or rotation Relieving factors: Rest, No motion, heat/hot shower, Gabapentin   PRECAUTIONS: None  RED FLAGS: Numbness tingling down both UE     WEIGHT BEARING RESTRICTIONS: No  FALLS:  Has patient fallen in last 6 months? Yes. Number of falls 1  OCCUPATION: N/A  PLOF: Assisted with iADLs, independent with ADLs mostly but intermittent assist with lower body cleaning due to dizziness, intermittent assist with  washing hair due to UE weakness  PATIENT GOALS: To strengthen arms and to try to relieve tension in neck because its so sore  NEXT  MD VISIT: September 10th, 2025  OBJECTIVE:  Note: Objective measures were completed at Evaluation unless otherwise noted.  DIAGNOSTIC FINDINGS:  FINDINGS: Normal alignment. C5-C6 and C6-C7 disc arthroplasty in unchanged alignment. No periprosthetic lucency. Remaining disc spaces are normal. No fracture. No prevertebral soft tissue thickening.   IMPRESSION: C5-C6 and C6-C7 disc arthroplasty without complication.    PATIENT SURVEYS:  Neck Disability Index score: 33 / 50 = 66.0 % Upper Extremity Function Scale: 48 / 80 = 60.0 %    COGNITION: Overall cognitive status: Within functional limits for tasks assessed  SENSATION: Light touch: Impaired  Dec sensation throughout R cerv dermatomes   POSTURE: rounded shoulders and forward head  PALPATION: Very TTP with grade 1 CPA/UPA throughout cervical and upper thoracic spin    CERVICAL ROM:   Active ROM A/PROM (deg) eval  Flexion 15 deg, pain in neck   Extension 30 deg, pain in neck  Right lateral flexion 25, pain in neck  Left lateral flexion 20, pain in neck  Right rotation 30, pain in neck  Left rotation 35, pain in neck and N/T down RLE   (Blank rows = not tested)  UPPER EXTREMITY ROM:  Active ROM Right eval Left eval  Shoulder flexion 120 120  Shoulder extension    Shoulder abduction 120 120  Shoulder adduction    Shoulder extension    Shoulder internal rotation    Shoulder external rotation    Elbow flexion    Elbow extension    Wrist flexion    Wrist extension    Wrist ulnar deviation    Wrist radial deviation    Wrist pronation    Wrist supination     (Blank rows = not tested)  UPPER EXTREMITY MMT:  MMT Right eval Left eval  Shoulder flexion 4- 4-  Shoulder extension    Shoulder abduction 4- 4-  Shoulder adduction    Shoulder extension    Shoulder internal  rotation 4- 4-  Shoulder external rotation 4- 4-  Middle trapezius    Lower trapezius    Elbow flexion    Elbow extension    Wrist flexion    Wrist extension    Wrist ulnar deviation    Wrist radial deviation    Wrist pronation    Wrist supination    Grip strength     (Blank rows = not tested)  CERVICAL SPECIAL TESTS:  Distraction test: Positive Decrease in N/T down RUE  FUNCTIONAL TESTS:    TREATMENT DATE:  04/08/2024  Manual Therapy: -Segmental lateral flexion glides of segments C3-C7, grade II-III mobilizations -STM of Cervical Paraspinal musculature on R side Therapeutic Exercise: -Cervical rotation snags, 1 set of 8 reps bilaterally, pt cued for proper set up -Cervical extension snags, 1 set of 8 reps, pt cued for decreased shoulder elevation -Seated chin tucks and scapular retractions, 1 sets of 5 reps, 3 second holds -Supine chin tucks 1 sets of 10 reps, 3 second holds, pt cued for max pain free ROM -Standing shoulder rows/extensions, GTB above chest level, 1 set of 10 reps, pt cued for increased ROM -Lat pull downs, 1 set of 10 reps, plate 4 pt cued for scapular stabilization -Green ball roll up wall, 2 sets of 4 reps, pt cued for increased ROM  04/05/24: STM and MHP donned to cervical musculature, 8', R and L sides MHP donned during AROM, pain free range:  Rotation, 2'  Chin tuck + extension, 2' Chin tuck + flexion, 2' Supine, lateral flexion  with MHP donned, 3' Manual Cervical distraction, 10-15 holds, no change in RUE sensations Seated scapular retraction, 10, 5x Seated TA contraction + posterior shoulder rolls, 1'   03/29/24 Goal and POC review Seated: Levator Scapulae Stretch 2X30 each side  UT stretch 2X30 each side  Shoulder rolls up,back, down 10X  Scapular retraction 10X5  Cervical retraction 10X5 Manual to bil cervical seated     PATIENT EDUCATION:  Education details: PT evaluation, objective findings, POC, Importance of HEP,  Precautions, Clinic policies  Person educated: Patient Education method: Explanation and Demonstration Education comprehension: verbalized understanding and returned demonstration  HOME EXERCISE PROGRAM: Access Code: [email or web address] URL: [email or web address] Date: 03/17/2024 Prepared by: Rosaria Powell-Butler  Exercises - Seated Levator Scapulae Stretch  - 2 x daily - 7 x weekly - 3 sets - 30 hold - Seated Upper Trapezius Stretch  - 2 x daily - 7 x weekly - 3 sets - 30 hold - Seated Scapular Retraction  - 2 x daily - 7 x weekly - 3 sets - 10 reps - Supine Cervical Rotation AROM on Pillow  - 2 x daily - 7 x weekly - 3 sets - 10 reps - Supine Chin Tuck  - 2 x daily - 7 x weekly - 3 sets - 10 reps  03/29/24:  cervical AROM 10X each direction  ASSESSMENT:  CLINICAL IMPRESSION: Patient continues to demonstrate decreased cervical spine ROM, increased pain in neck, and increased tension in bilateral upper trapezius and paraspinals of cervical spine (worse on right). Patient responds well to manual therapy with decreased tension and decreased pain rating. Pt continues to demonstrate abnormal sensation to bilateral hands, numbness and tingling reported throughout session. Patient requires moderate verbal cuing for relaxation of cervical spine musculature with difficulty on some movements. Patient would benefit from skilled physical therapy for decreased neck pain frequency and intensity, increased strength in cervical paraspinal and other postural musculature, and improved cervical mobility for improved ability to work without symptom reproduction, return to higher level of function with ADLs, and progress towards therapy goals.       OBJECTIVE IMPAIRMENTS: decreased mobility, decreased ROM, decreased strength, impaired UE functional use, improper body mechanics, postural dysfunction, and pain.   ACTIVITY LIMITATIONS: carrying, lifting, sleeping, dressing, reach over head, and  hygiene/grooming  PARTICIPATION LIMITATIONS: meal prep, cleaning, laundry, driving, community activity, and occupation  PERSONAL FACTORS: Time since onset of injury/illness/exacerbation are also affecting patient's functional outcome.   REHAB POTENTIAL: Good  CLINICAL DECISION MAKING: Stable/uncomplicated  EVALUATION COMPLEXITY: Low   GOALS: Goals reviewed with patient? Yes  SHORT TERM GOALS: Target date: 04/07/24 Patient will be independent with performance of HEP to demonstrate adequate self management of symptoms.  Baseline:  Goal status: IN PROGRESS  2.   Patient will report at least a 25% improvement with function and/or pain reduction overall since beginning PT. Baseline:  Goal status: IN PROGRESS   LONG TERM GOALS: Target date: 04/28/24 Patient will improve NDI score by 18  points to demonstrate improved perceived function. Baseline: See above Goal status: IN PROGRESS  2.  Patient will improve cervical extension ROM to at least 70 degrees to show improved neck mobility for safe performance of functional tasks such as looking upward to higher cabinets.  Baseline: See above Goal status: IN PROGRESS  3.  Patient will improve cervical rotation ROM to at least 70 degrees bilaterally to show improved neck mobility for safe performance of functional tasks such as driving.  Baseline: See above Goal status: IN  PROGRESS  4. Patient will improve shoulder flexion ROM by at least 20 degrees bilaterally in order to demonstrate improved shoulder mobility needed for functional tasks such as reaching overhead or washing hair. Baseline: See above Goal status: IN PROGRESS  5.  Patient will improve shoulder flexion MMT bilaterally by at least 1/2 a grade with reports of decreased pain to show increased UE strength. Baseline: See above Goal status: IN PROGRESS    PLAN:  PT FREQUENCY: 2x/week  PT DURATION: 6 weeks  PLANNED INTERVENTIONS: 97164- PT Re-evaluation,  97110-Therapeutic exercises, 97530- Therapeutic activity, 97112- Neuromuscular re-education, 97535- Self Care, 02859- Manual therapy, [phone]- Gait training, [phone]- Electrical stimulation (manual), [id number]- Traction (mechanical), [phone] (1-2 muscles), 20561 (3+ muscles)- Dry Needling, Patient/Family education, Balance training, Stair training, Taping, Joint mobilization, Spinal mobilization, Cryotherapy, and Moist heat  PLAN FOR NEXT SESSION: continue to  prog cervical mobility/strength, address UE mobility/strengthening, manual techniques as needed: STM, distraction, mobilizations   Lang Ada, PT, DPT [facility] Office: [phone] 6:15 PM, 04/08/24

## 2024-04-11 ENCOUNTER — Other Ambulatory Visit: Payer: Self-pay | Admitting: Physician Assistant

## 2024-04-12 ENCOUNTER — Other Ambulatory Visit: Payer: Self-pay

## 2024-04-12 ENCOUNTER — Encounter
Admission: RE | Disposition: A | Discharge status: Home / Self Care | Payer: Self-pay | Source: Home / Self Care | Attending: Neurosurgery

## 2024-04-12 ENCOUNTER — Ambulatory Visit: Payer: Self-pay | Admitting: Urgent Care

## 2024-04-12 ENCOUNTER — Encounter (HOSPITAL_COMMUNITY): Admitting: Physical Therapy

## 2024-04-12 ENCOUNTER — Encounter: Payer: Self-pay | Admitting: Neurosurgery

## 2024-04-12 ENCOUNTER — Ambulatory Visit
Admission: RE | Admit: 2024-04-12 | Discharge: 2024-04-12 | Disposition: A | Discharge status: Home / Self Care | Attending: Neurosurgery | Admitting: Neurosurgery

## 2024-04-12 ENCOUNTER — Ambulatory Visit: Admitting: Anesthesiology

## 2024-04-12 DIAGNOSIS — E66813 Obesity, class 3: Secondary | ICD-10-CM | POA: Diagnosis not present

## 2024-04-12 DIAGNOSIS — G5601 Carpal tunnel syndrome, right upper limb: Secondary | ICD-10-CM | POA: Diagnosis present

## 2024-04-12 DIAGNOSIS — K219 Gastro-esophageal reflux disease without esophagitis: Secondary | ICD-10-CM | POA: Insufficient documentation

## 2024-04-12 DIAGNOSIS — R0602 Shortness of breath: Secondary | ICD-10-CM

## 2024-04-12 DIAGNOSIS — Z01818 Encounter for other preprocedural examination: Secondary | ICD-10-CM

## 2024-04-12 DIAGNOSIS — Z6838 Body mass index (BMI) 38.0-38.9, adult: Secondary | ICD-10-CM | POA: Insufficient documentation

## 2024-04-12 DIAGNOSIS — I10 Essential (primary) hypertension: Secondary | ICD-10-CM | POA: Diagnosis not present

## 2024-04-12 HISTORY — PX: CARPAL TUNNEL RELEASE: SHX101

## 2024-04-12 LAB — POCT PREGNANCY, URINE: Preg Test, Ur: NEGATIVE

## 2024-04-12 SURGERY — CARPAL TUNNEL RELEASE
Anesthesia: General | Site: Wrist | Laterality: Right

## 2024-04-12 MED ORDER — LIDOCAINE HCL (CARDIAC) PF 100 MG/5ML IV SOSY
PREFILLED_SYRINGE | INTRAVENOUS | Status: DC | PRN
  Administered 2024-04-12: 40 mg via INTRAVENOUS

## 2024-04-12 MED ORDER — MIDAZOLAM HCL 2 MG/2ML IJ SOLN
INTRAMUSCULAR | Status: DC | PRN
Start: 2024-04-12 — End: 2024-04-12
  Administered 2024-04-12: 2 mg via INTRAVENOUS

## 2024-04-12 MED ORDER — 0.9 % SODIUM CHLORIDE (POUR BTL) OPTIME
TOPICAL | Status: DC | PRN
  Administered 2024-04-12: 500 mL

## 2024-04-12 MED ORDER — LIDOCAINE HCL 1 % IJ SOLN
INTRAMUSCULAR | Status: DC | PRN
  Administered 2024-04-12: 4 mL

## 2024-04-12 MED ORDER — ONDANSETRON HCL 4 MG/2ML IJ SOLN
INTRAMUSCULAR | Status: DC | PRN
  Administered 2024-04-12: 4 mg via INTRAVENOUS

## 2024-04-12 MED ORDER — FENTANYL CITRATE (PF) 100 MCG/2ML IJ SOLN
INTRAMUSCULAR | Status: DC | PRN
  Administered 2024-04-12 (×4): 25 ug via INTRAVENOUS

## 2024-04-12 MED ORDER — POLYETHYLENE GLYCOL 3350 17 GM/SCOOP PO POWD
17.0000 g | Freq: Every day | ORAL | 0 refills | Status: DC | PRN
  Filled 2024-04-12: qty 238, 14d supply, fill #0

## 2024-04-12 MED ORDER — SENNA 8.6 MG PO TABS
1.0000 | ORAL_TABLET | Freq: Two times a day (BID) | ORAL | 0 refills | Status: DC | PRN
  Filled 2024-04-12: qty 14, 7d supply, fill #0

## 2024-04-12 MED ORDER — OXYCODONE HCL 5 MG PO TABS
5.0000 mg | ORAL_TABLET | Freq: Four times a day (QID) | ORAL | 0 refills | Status: AC | PRN
  Filled 2024-04-12: qty 8, 2d supply, fill #0

## 2024-04-12 MED ORDER — FENTANYL CITRATE (PF) 100 MCG/2ML IJ SOLN
INTRAMUSCULAR | Status: AC
  Filled 2024-04-12: qty 2

## 2024-04-12 MED ORDER — LACTATED RINGERS IV SOLN: INTRAVENOUS | Status: DC

## 2024-04-12 MED ORDER — ORAL CARE MOUTH RINSE: 15.0000 mL | Freq: Once | OROMUCOSAL | Status: AC

## 2024-04-12 MED ORDER — CEFAZOLIN IN SODIUM CHLORIDE 2-0.9 GM/100ML-% IV SOLN
2.0000 g | Freq: Once | INTRAVENOUS | Status: AC
  Administered 2024-04-12: 2 g via INTRAVENOUS
  Filled 2024-04-12: qty 100

## 2024-04-12 MED ORDER — CHLORHEXIDINE GLUCONATE 0.12 % MT SOLN
15.0000 mL | Freq: Once | OROMUCOSAL | Status: AC
  Administered 2024-04-12: 15 mL via OROMUCOSAL

## 2024-04-12 MED ORDER — MIDAZOLAM HCL 2 MG/2ML IJ SOLN
INTRAMUSCULAR | Status: AC
  Filled 2024-04-12: qty 2

## 2024-04-12 MED ORDER — PROPOFOL 500 MG/50ML IV EMUL
INTRAVENOUS | Status: DC | PRN
  Administered 2024-04-12: 80 ug/kg/min via INTRAVENOUS

## 2024-04-12 MED ORDER — CHLORHEXIDINE GLUCONATE 0.12 % MT SOLN
OROMUCOSAL | Status: AC
  Filled 2024-04-12: qty 15

## 2024-04-12 SURGICAL SUPPLY — 20 items
BNDG ADH 1X3 SHEER STRL LF (GAUZE/BANDAGES/DRESSINGS) ×1 IMPLANT
BNDG ADH 2 X3.75 FABRIC TAN LF (GAUZE/BANDAGES/DRESSINGS) IMPLANT
BRUSH SCRUB EZ 4% CHG (MISCELLANEOUS) ×1 IMPLANT
CHLORAPREP W/TINT 26 (MISCELLANEOUS) ×1 IMPLANT
COVER PROBE FLX POLY STRL (MISCELLANEOUS) ×1 IMPLANT
DERMABOND ADVANCED .7 DNX12 (GAUZE/BANDAGES/DRESSINGS) ×1 IMPLANT
DRAPE EXTREMITY 106X87X128.5 (DRAPES) ×1 IMPLANT
DRAPE IMP U-DRAPE 54X76 (DRAPES) ×1 IMPLANT
DRAPE SHEET LG 3/4 BI-LAMINATE (DRAPES) ×1 IMPLANT
GLOVE BIOGEL PI IND STRL 8 (GLOVE) ×1 IMPLANT
GLOVE SRG 8 PF TXTR STRL LF DI (GLOVE) ×1 IMPLANT
GLOVE SURG SYN 7.5 PF PI (GLOVE) ×1 IMPLANT
GOWN SRG XL LVL 3 NONREINFORCE (GOWNS) ×1 IMPLANT
KIT TURNOVER KIT A (KITS) ×1 IMPLANT
NDL HYPO 25X1 1.5 SAFETY (NEEDLE) ×1 IMPLANT
NEEDLE HYPO 25X1 1.5 SAFETY (NEEDLE) ×1 IMPLANT
NS IRRIG 500ML POUR BTL (IV SOLUTION) ×1 IMPLANT
PACK BASIN MINOR ARMC (MISCELLANEOUS) ×1 IMPLANT
STOCKINETTE IMPERVIOUS 9X36 MD (GAUZE/BANDAGES/DRESSINGS) ×1 IMPLANT
ULTRAGLIDE CTR (BLADE) ×1 IMPLANT

## 2024-04-12 NOTE — Anesthesia Postprocedure Evaluation (Signed)
 Anesthesia Post Note  Patient: [name]  Procedure(s) Performed: Right side carpal tunnel release with ultrasound guidance (Right: Wrist)  Patient location during evaluation: PACU Anesthesia Type: General Level of consciousness: awake and alert Pain management: pain level controlled Vital Signs Assessment: post-procedure vital signs reviewed and stable Respiratory status: spontaneous breathing, nonlabored ventilation and respiratory function stable Cardiovascular status: blood pressure returned to baseline and stable Postop Assessment: no apparent nausea or vomiting Anesthetic complications: no   No notable events documented.   Last Vitals:  Vitals:   04/12/24 1000 04/12/24 1015  BP: (!) 151/71 (!) 167/94  Pulse: 73 61  Resp: 14 14  Temp:    SpO2: 94% 98%    Last Pain:  Vitals:   04/12/24 1015  PainSc: 0-No pain                 Camellia Merilee Louder

## 2024-04-12 NOTE — Discharge Instructions (Addendum)

## 2024-04-12 NOTE — Interval H&P Note (Signed)
 History and Physical Interval Note:  04/12/2024 8:53 AM  [name]  has presented today for surgery, with the diagnosis of Severe right sided carpal tunnel syndrome.  The various methods of treatment have been discussed with the patient and family. After consideration of risks, benefits and other options for treatment, the patient has consented to  Procedure(s) with comments: Right side carpal tunnel release with ultrasound guidance, possible convert to open (Right) - Right side carpal tunnel release with ultrasound guidance, possible convert to open as a surgical intervention.  The patient's history has been reviewed, patient examined, no change in status, stable for surgery.  I have reviewed the patient's chart and labs.  Questions were answered to the patient's satisfaction.    Heart and lungs clear   Penne LELON Sharps

## 2024-04-12 NOTE — Transfer of Care (Signed)
 Immediate Anesthesia Transfer of Care Note  Patient: [name]  Procedure(s) Performed: Right side carpal tunnel release with ultrasound guidance (Right: Wrist)  Patient Location: PACU  Anesthesia Type:MAC  Level of Consciousness: drowsy  Airway & Oxygen Therapy: Patient Spontanous Breathing and Patient connected to nasal cannula oxygen  Post-op Assessment: Report given to RN and Post -op Vital signs reviewed and stable  Post vital signs: Reviewed and stable  Last Vitals:  Vitals Value Taken Time  BP 130/70 04/12/24 09:48  Temp 36.8 C 04/12/24 09:48  Pulse 70 04/12/24 09:52  Resp 11 04/12/24 09:52  SpO2 98 % 04/12/24 09:52  Vitals shown include unfiled device data.  Last Pain:  Vitals:   04/12/24 0856  PainSc: 0-No pain         Complications: No notable events documented.

## 2024-04-12 NOTE — Op Note (Signed)
 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  Findings: Severe compression of the median nerve at the transverse carpal ligament  Preoperative Diagnosis:Severe right sided carpal tunnel syndrome  Postoperative Diagnosis: Severe right sided carpal tunnel syndrome   Postoperative Diagnosis: same   EBL: Minimal IVF: See anesthesia report Drains: none Disposition:Stable to PACU Complications: none  No foley catheter was placed.   Preoperative Note: patient with a history of progressive right median neuropathy with hand weakness refractory to conservative management.  They had tried rest, padding, and watchful waiting but had continued progressive symptoms.  Given the progression of her median neuropathy plan was made for median nerve decompression  Risk of surgery is discussed and include: Infection, bleeding, wound healing issues, pillar pain nerve injury, pain, failure to relieve the symptoms, need for further surgery.  Procedure:  1) right sided carpal tunnel decompression with ultrasound guidance   Procedure: After obtaining informed consent, the patient taken to the operating room, placed in supine position, monitored anesthesia care was induced.  They were given preoperative antibiotics.  Prepped and draped in the usual fashion.  Comprehensive timeout was performed verifying the patient's name, MRN, planned procedure.  An ultrasound was used with a sterile probe.  We used this to mark out our safety points including the interval between the ulnar artery and median nerve.  We identified the motor branch as well as the first sensory branch which were both in safe position.  We also identified the vascular arcade which was in safe positioning as well.  At this point we placed a block with Marcaine  without epinephrine .  We blocked the skin where the incision would be, the superficial sensory median nerve,  as well as the transverse carpal ligament.  Under ultrasound guidance we utilized the local anesthetic to perform a hydrodissection of the nerve from the transverse carpal ligament.  We then prepped the sonexs ultra CTR knife on the back table while the anesthetic set in.  We performed a small linear incision approximately 2 to 3 mm.  We then utilized a Statistician under ultrasound guidance to identify the underside of the transverse carpal ligament.  The fat and connective tissue was dissected off the underside.  We could feel that this was quite thickened and calcified.  Causing severe compression.  Once we had a clear tract we then placed the ultrasound-guided knife into the incision and advanced it through the carpal tunnel.  We verified the safety zones including the median nerve which did not significantly cross over the knife.  We are able to see the first sensory branch which was not crossing the knife.  The artery was also in a safe place.  At this point we divided the transverse carpal ligament under ultrasound guidance, to get a full release it took 2 passes.  The nerve relaxed laterally and was well decompressed.  After the 2 passes we placed a [place] 4 into the wound and were able to feel a complete dissection of the transverse carpal ligament.  We then irrigated, we got meticulous hemostasis.  Skin glue was placed on the incision and a Band-Aid was placed on top once this was dried.  No immediate complications.  Sponge and pattie counts were correct at the end of the procedure.   I performed the procedure without an assistant surgeon  Penne MICAEL Sharps, MD/MSCR

## 2024-04-12 NOTE — Anesthesia Preprocedure Evaluation (Signed)
 Anesthesia Evaluation  Patient identified by MRN, date of birth, ID band Patient awake    Reviewed: Allergy & Precautions, H&P , NPO status , Patient's Chart, lab work & pertinent test results, reviewed documented beta blocker date and time   Airway Mallampati: II  TM Distance: >3 FB Neck ROM: Full    Dental  (+) Lower Dentures, Upper Dentures   Pulmonary shortness of breath and with exertion, COPD (denied h/o COPD, uses inhaler, last use - month ago),  COPD inhaler, Current Smoker and Patient abstained from smoking.   Pulmonary exam normal breath sounds clear to auscultation       Cardiovascular Exercise Tolerance: Good hypertension, Pt. on medications and Pt. on home beta blockers Normal cardiovascular exam Rhythm:Regular Rate:Normal     Neuro/Psych  PSYCHIATRIC DISORDERS Anxiety Depression    negative neurological ROS     GI/Hepatic Neg liver ROS,GERD  Medicated and Controlled,,  Endo/Other    Class 3 obesity  Renal/GU      Musculoskeletal   Abdominal  (+) + obese  Peds  Hematology negative hematology ROS (+)   Anesthesia Other Findings   Reproductive/Obstetrics negative OB ROS                              Anesthesia Physical Anesthesia Plan  ASA: 3  Anesthesia Plan: General   Post-op Pain Management: Minimal or no pain anticipated   Induction: Intravenous  PONV Risk Score and Plan: 3 and Ondansetron , Dexamethasone , Propofol  infusion and TIVA  Airway Management Planned: Natural Airway  Additional Equipment:   Intra-op Plan:   Post-operative Plan:   Informed Consent:      Dental Advisory Given  Plan Discussed with: Anesthesiologist, CRNA and Surgeon  Anesthesia Plan Comments:         Anesthesia Quick Evaluation

## 2024-04-13 ENCOUNTER — Encounter: Payer: Self-pay | Admitting: Neurosurgery

## 2024-04-14 ENCOUNTER — Encounter (HOSPITAL_COMMUNITY)

## 2024-04-19 ENCOUNTER — Ambulatory Visit (HOSPITAL_COMMUNITY): Attending: Family Medicine | Admitting: Physical Therapy

## 2024-04-19 ENCOUNTER — Other Ambulatory Visit: Payer: Self-pay | Admitting: Family Medicine

## 2024-04-19 DIAGNOSIS — R202 Paresthesia of skin: Secondary | ICD-10-CM | POA: Diagnosis not present

## 2024-04-19 DIAGNOSIS — M542 Cervicalgia: Secondary | ICD-10-CM | POA: Diagnosis not present

## 2024-04-19 DIAGNOSIS — R0602 Shortness of breath: Secondary | ICD-10-CM

## 2024-04-19 DIAGNOSIS — M5382 Other specified dorsopathies, cervical region: Secondary | ICD-10-CM | POA: Diagnosis not present

## 2024-04-19 DIAGNOSIS — R200 Anesthesia of skin: Secondary | ICD-10-CM | POA: Diagnosis not present

## 2024-04-19 DIAGNOSIS — R6889 Other general symptoms and signs: Secondary | ICD-10-CM | POA: Diagnosis not present

## 2024-04-19 NOTE — Therapy (Signed)
 OUTPATIENT PHYSICAL THERAPY CERVICAL TREATMENT   Patient Name: [name] MRN: [id number] DOB:[birth year], 48 y.o., female Today's Date: 04/19/2024  END OF SESSION:  PT End of Session - 04/19/24 1345     Visit Number 5    Number of Visits 9    Date for Recertification  04/28/24    Authorization Type [place] [employer]    Authorization Time Period 8 visits 9/4-11/2    Authorization - Visit Number 4    Authorization - Number of Visits 8    Progress Note Due on Visit 8    PT Start Time 1336    PT Stop Time 1419    PT Time Calculation (min) 43 min    Activity Tolerance Patient tolerated treatment well    Behavior During Therapy [facility] for tasks assessed/performed            Past Medical History:  Diagnosis Date   Anxiety    Phreesia 12/30/2019   Asthma    COPD (chronic obstructive pulmonary disease) (HCC)    Depression    Dyspnea    GERD (gastroesophageal reflux disease)    Hidradenitis suppurativa    History of hiatal hernia    Hyperlipidemia    Hypertension    IBS (irritable bowel syndrome)    Intervertebral disc disorder with myelopathy, cervical region    OAB (overactive bladder)    Tobacco use    Vitamin B 12 deficiency    Vitamin D  deficiency    Past Surgical History:  Procedure Laterality Date   BIOPSY  05/22/2022   Procedure: BIOPSY;  Surgeon: Eartha Angelia Sieving, MD;  [facility];  Service: Gastroenterology;;   BIOPSY  11/26/2022   Procedure: BIOPSY;  Surgeon: Eartha Angelia Sieving, MD;  [facility];  Service: Gastroenterology;;   ORIN MEDIATE RELEASE Right 04/12/2024   Procedure: Right side carpal tunnel release with ultrasound guidance;  Surgeon: Claudene Penne ORN, MD;  Location: [facility];  Service: Neurosurgery;  Laterality: Right;  Right side carpal tunnel release with ultrasound guidance, possible convert to open   CERVICAL ANTERIOR DISC ARTHROPLASTY, 2 LEVEL N/A 11/10/2023   Procedure: CERVICAL ANTERIOR  DISC ARTHROPLASTY, 2 LEVEL;  Surgeon: Claudene Penne ORN, MD;  Location: [facility];  Service: Neurosurgery;  Laterality: N/A;   CERVICAL DISC ARTHROPLASTY  11/10/2023   Procedure: CERVICAL ANTERIOR DISC ARTHROPLASTY Levels 5-7;  Surgeon: Claudene Penne ORN, MD;  Location: [facility];  Service: Neurosurgery;;   CHOLECYSTECTOMY     COLONOSCOPY WITH PROPOFOL  N/A 05/22/2022   Procedure: COLONOSCOPY WITH PROPOFOL ;  Surgeon: Eartha Angelia Sieving, MD;  [facility];  Service: Gastroenterology;  Laterality: N/A;  1215 ASA 2   ESOPHAGOGASTRODUODENOSCOPY (EGD) WITH PROPOFOL  N/A 11/26/2022   Procedure: ESOPHAGOGASTRODUODENOSCOPY (EGD) WITH PROPOFOL ;  Surgeon: Eartha Angelia Sieving, MD;  [facility]: [facility];  Service: Gastroenterology;  Laterality: N/A;  1:00PM;asa 2   POLYPECTOMY  05/22/2022   Procedure: POLYPECTOMY;  Surgeon: Eartha Angelia Sieving, MD;  [facility]: [facility];  Service: Gastroenterology;;   TUBAL LIGATION     Patient Active Problem List   Diagnosis Date Noted   Carpal tunnel syndrome of right wrist 03/23/2024   Mass of left side of neck 02/12/2024   Leg swelling 02/12/2024   SOB (shortness of breath) 12/03/2023   Cervical radiculopathy 11/10/2023   Intervertebral cervical disc disorder with myelopathy, cervical region 10/21/2023   Left arm weakness 10/21/2023   Hidradenitis suppurativa 08/05/2023   Depression, major, single  episode, moderate (HCC) 08/05/2023   Irritable bowel syndrome with constipation 06/09/2023   Mixed stress and urge urinary incontinence 04/07/2023   Neck pain 04/01/2023   Early satiety 03/01/2023   Overactive bladder 11/05/2022   Diarrhea 05/05/2022   Loss of weight 05/05/2022   Vitamin B12 deficiency 04/12/2020   Vitamin D  deficiency 04/12/2020   Dizziness 04/12/2020   HTN (hypertension) 12/30/2019   Tobacco abuse 12/30/2019   GERD (gastroesophageal reflux disease) 12/30/2019   Spondylosis 12/30/2019    PCP: [name], FNP  REFERRING PROVIDER: Ulis Bottcher, PA-C  REFERRING DIAG: [phone] (ICD-10-CM) - Left arm weakness M50.10 (ICD-10-CM) - Cervical disc disorder with radiculopathy of cervical region  THERAPY DIAG:  No diagnosis found.  Rationale for Evaluation and Treatment: Rehabilitation  ONSET DATE: April 29th  SUBJECTIVE:                                                                                                                                                                                                         SUBJECTIVE STATEMENT: Pt returns today after 2 week absence due to just had surgery on Rt wrist 9/30 for carpal tunnel release. RTMD 10/13 so unsure of restrictions at this point. States her wrist is really sore today.   States she has not done any of her exercises or activities since her surgery and currently her neck pain is 4/10.  States they may have to do her Lt wrist as well. States she's been using her walker some with ambulation as having dizzy spells but not sure if because of her meds or what been going on before her surgery.  Suppose to be getting labs.    Evaluation: Patient reports since last episode, she agreed to surgery. Had surgery on cervical spine on April 29th, 2025. Reports previous weakness in LUE is better but now RUE is bad. Goes for carpal tunnel testing tomorrow. Reports her R arm just goes to sleep when she writing. Increased numbness/tingling down UE with cervical flexion but N/T nearly constant.  Hand dominance: Right Reports she recently changed BP mediations, MD put her on a new inhaler, increased dosage of another inhaler and she has discontinued pain medications.   PERTINENT HISTORY:  Chronic cervical pain Cervical Disc Arthroplasty Myelopathy of cervical region    PAIN:  Are you having pain? Yes: NPRS scale: Neck: 4/10, wrist pain: R 7/10 and L just tingling no pain Pain location: Neck and BUE Pain description: constant ache pain in  neck, N/T down arms  Aggravating factors: cervical flexion and/or rotation  Relieving factors: Rest, No motion, heat/hot shower, Gabapentin   PRECAUTIONS: None  RED FLAGS: Numbness tingling down both UE     WEIGHT BEARING RESTRICTIONS: No  FALLS:  Has patient fallen in last 6 months? Yes. Number of falls 1  OCCUPATION: N/A  PLOF: Assisted with iADLs, independent with ADLs mostly but intermittent assist with lower body cleaning due to dizziness, intermittent assist with washing hair due to UE weakness  PATIENT GOALS: To strengthen arms and to try to relieve tension in neck because its so sore  NEXT MD VISIT: September 10th, 2025  OBJECTIVE:  Note: Objective measures were completed at Evaluation unless otherwise noted.  DIAGNOSTIC FINDINGS:  FINDINGS: Normal alignment. C5-C6 and C6-C7 disc arthroplasty in unchanged alignment. No periprosthetic lucency. Remaining disc spaces are normal. No fracture. No prevertebral soft tissue thickening.   IMPRESSION: C5-C6 and C6-C7 disc arthroplasty without complication.    PATIENT SURVEYS:  Neck Disability Index score: 33 / 50 = 66.0 % Upper Extremity Function Scale: 48 / 80 = 60.0 %    COGNITION: Overall cognitive status: Within functional limits for tasks assessed  SENSATION: Light touch: Impaired  Dec sensation throughout R cerv dermatomes   POSTURE: rounded shoulders and forward head  PALPATION: Very TTP with grade 1 CPA/UPA throughout cervical and upper thoracic spin    CERVICAL ROM:   Active ROM A/PROM (deg) eval  Flexion 15 deg, pain in neck   Extension 30 deg, pain in neck  Right lateral flexion 25, pain in neck  Left lateral flexion 20, pain in neck  Right rotation 30, pain in neck  Left rotation 35, pain in neck and N/T down RLE   (Blank rows = not tested)  UPPER EXTREMITY ROM:  Active ROM Right eval Left eval  Shoulder flexion 120 120  Shoulder extension    Shoulder abduction 120 120  Shoulder  adduction    Shoulder extension    Shoulder internal rotation    Shoulder external rotation    Elbow flexion    Elbow extension    Wrist flexion    Wrist extension    Wrist ulnar deviation    Wrist radial deviation    Wrist pronation    Wrist supination     (Blank rows = not tested)  UPPER EXTREMITY MMT:  MMT Right eval Left eval  Shoulder flexion 4- 4-  Shoulder extension    Shoulder abduction 4- 4-  Shoulder adduction    Shoulder extension    Shoulder internal rotation 4- 4-  Shoulder external rotation 4- 4-  Middle trapezius    Lower trapezius    Elbow flexion    Elbow extension    Wrist flexion    Wrist extension    Wrist ulnar deviation    Wrist radial deviation    Wrist pronation    Wrist supination    Grip strength     (Blank rows = not tested)  CERVICAL SPECIAL TESTS:  Distraction test: Positive Decrease in N/T down RUE  FUNCTIONAL TESTS:    TREATMENT DATE:  04/19/24 Seated:  cervical AROM 10X each direction  Chin tucks with scapular retraction 10X3 holds Standing doorway stretch for chest mm 3X30  UE flexion against wall 10X Seated manual to bil UT   04/08/2024  Manual Therapy: -Segmental lateral flexion glides of segments C3-C7, grade II-III mobilizations -STM of Cervical Paraspinal musculature on R side Therapeutic Exercise: -Cervical rotation snags, 1 set of 8 reps bilaterally, pt cued for proper set up -Cervical extension  snags, 1 set of 8 reps, pt cued for decreased shoulder elevation -Seated chin tucks and scapular retractions, 1 sets of 5 reps, 3 second holds -Supine chin tucks 1 sets of 10 reps, 3 second holds, pt cued for max pain free ROM -Standing shoulder rows/extensions, GTB above chest level, 1 set of 10 reps, pt cued for increased ROM -Lat pull downs, 1 set of 10 reps, plate 4 pt cued for scapular stabilization -Green ball roll up wall, 2 sets of 4 reps, pt cued for increased ROM  04/05/24: STM and MHP donned to cervical  musculature, 8', R and L sides MHP donned during AROM, pain free range:  Rotation, 2'  Chin tuck + extension, 2' Chin tuck + flexion, 2' Supine, lateral flexion with MHP donned, 3' Manual Cervical distraction, 10-15 holds, no change in RUE sensations Seated scapular retraction, 10, 5x Seated TA contraction + posterior shoulder rolls, 1'   03/29/24 Goal and POC review Seated: Levator Scapulae Stretch 2X30 each side  UT stretch 2X30 each side  Shoulder rolls up,back, down 10X  Scapular retraction 10X5  Cervical retraction 10X5 Manual to bil cervical seated     PATIENT EDUCATION:  Education details: PT evaluation, objective findings, POC, Importance of HEP, Precautions, Clinic policies  Person educated: Patient Education method: Explanation and Demonstration Education comprehension: verbalized understanding and returned demonstration  HOME EXERCISE PROGRAM: Access Code: [email or web address] URL: [email or web address] Date: 03/17/2024 Prepared by: Rosaria Powell-Butler  Exercises - Seated Levator Scapulae Stretch  - 2 x daily - 7 x weekly - 3 sets - 30 hold - Seated Upper Trapezius Stretch  - 2 x daily - 7 x weekly - 3 sets - 30 hold - Seated Scapular Retraction  - 2 x daily - 7 x weekly - 3 sets - 10 reps - Supine Cervical Rotation AROM on Pillow  - 2 x daily - 7 x weekly - 3 sets - 10 reps - Supine Chin Tuck  - 2 x daily - 7 x weekly - 3 sets - 10 reps  03/29/24:  cervical AROM 10X each direction  ASSESSMENT:  CLINICAL IMPRESSION: Pt with healing incision Rt wrist, has not returned back to MD so unsure of restrictions at this point. Completed all cervical/UE exercises established but held any exercises that required use of distal LE as unsure of limitations and soreness.  Added UE flexion against wall with ability to achieve approx 75% of full ROM.  Pt reported limited by weakness with this activity.  Also added standing chest stretch in doorway with good results.  Pt  with several coughing spells during session today but able to complete instructed therex.  Pt reports she is getting a pulmonary lung study next week as they found some nodules on Lt lung. Manual completed in seated today to cervical Rt>Lt.  Overall reduction in tension bilaterally with minimal tightness palpated with manual. Decreased pain rating following manual. Patient would benefit from skilled physical therapy for decreased neck pain frequency and intensity, increased strength in cervical paraspinal and other postural musculature, and improved cervical mobility for improved ability to work without symptom reproduction, return to higher level of function with ADLs, and progress towards therapy goals.       OBJECTIVE IMPAIRMENTS: decreased mobility, decreased ROM, decreased strength, impaired UE functional use, improper body mechanics, postural dysfunction, and pain.   ACTIVITY LIMITATIONS: carrying, lifting, sleeping, dressing, reach over head, and hygiene/grooming  PARTICIPATION LIMITATIONS: meal prep, cleaning, laundry, driving, community activity, and occupation  PERSONAL FACTORS: Time  since onset of injury/illness/exacerbation are also affecting patient's functional outcome.   REHAB POTENTIAL: Good  CLINICAL DECISION MAKING: Stable/uncomplicated  EVALUATION COMPLEXITY: Low   GOALS: Goals reviewed with patient? Yes  SHORT TERM GOALS: Target date: 04/07/24 Patient will be independent with performance of HEP to demonstrate adequate self management of symptoms.  Baseline:  Goal status: IN PROGRESS  2.   Patient will report at least a 25% improvement with function and/or pain reduction overall since beginning PT. Baseline:  Goal status: IN PROGRESS   LONG TERM GOALS: Target date: 04/28/24 Patient will improve NDI score by 18  points to demonstrate improved perceived function. Baseline: See above Goal status: IN PROGRESS  2.  Patient will improve cervical extension ROM to at  least 70 degrees to show improved neck mobility for safe performance of functional tasks such as looking upward to higher cabinets.  Baseline: See above Goal status: IN PROGRESS  3.  Patient will improve cervical rotation ROM to at least 70 degrees bilaterally to show improved neck mobility for safe performance of functional tasks such as driving.  Baseline: See above Goal status: IN PROGRESS  4. Patient will improve shoulder flexion ROM by at least 20 degrees bilaterally in order to demonstrate improved shoulder mobility needed for functional tasks such as reaching overhead or washing hair. Baseline: See above Goal status: IN PROGRESS  5.  Patient will improve shoulder flexion MMT bilaterally by at least 1/2 a grade with reports of decreased pain to show increased UE strength. Baseline: See above Goal status: IN PROGRESS    PLAN:  PT FREQUENCY: 2x/week  PT DURATION: 6 weeks  PLANNED INTERVENTIONS: [id number]- PT Re-evaluation, 97110-Therapeutic exercises, 97530- Therapeutic activity, [id number]- Neuromuscular re-education, 97535- Self Care, [id number]- Manual therapy, [id number]- Gait training, [phone]- Electrical stimulation (manual), [id number]- Traction (mechanical), 20560 (1-2 muscles), 20561 (3+ muscles)- Dry Needling, Patient/Family education, Balance training, Stair training, Taping, Joint mobilization, Spinal mobilization, Cryotherapy, and Moist heat  PLAN FOR NEXT SESSION: continue to  prog cervical mobility/strength, address UE mobility/strengthening, manual techniques as needed: STM, distraction, mobilizations.  F/U with Rt wrist restrictions following MD appt 10/13.    Greig KATHEE Fuse, PTA/CLT [facility] Ph: [phone]  2:27 PM, 04/19/24

## 2024-04-21 ENCOUNTER — Other Ambulatory Visit: Payer: Self-pay | Admitting: Family Medicine

## 2024-04-21 ENCOUNTER — Encounter (HOSPITAL_COMMUNITY): Payer: Self-pay

## 2024-04-21 ENCOUNTER — Ambulatory Visit (HOSPITAL_COMMUNITY)

## 2024-04-21 DIAGNOSIS — M542 Cervicalgia: Secondary | ICD-10-CM | POA: Diagnosis not present

## 2024-04-21 DIAGNOSIS — R202 Paresthesia of skin: Secondary | ICD-10-CM | POA: Diagnosis not present

## 2024-04-21 DIAGNOSIS — R6889 Other general symptoms and signs: Secondary | ICD-10-CM

## 2024-04-21 DIAGNOSIS — R200 Anesthesia of skin: Secondary | ICD-10-CM | POA: Diagnosis not present

## 2024-04-21 DIAGNOSIS — F321 Major depressive disorder, single episode, moderate: Secondary | ICD-10-CM

## 2024-04-21 DIAGNOSIS — M5382 Other specified dorsopathies, cervical region: Secondary | ICD-10-CM

## 2024-04-21 NOTE — Therapy (Addendum)
 OUTPATIENT PHYSICAL THERAPY CERVICAL TREATMENT   Patient Name: RANDALYN AHMED MRN: [id number] DOB:1976-05-19, 48 y.o., female Today's Date: 04/21/2024  END OF SESSION:  PT End of Session - 04/21/24 1513     Visit Number 6    Number of Visits 9    Date for Recertification  04/28/24    Authorization Type [place] [employer]    Authorization Time Period 8 visits 9/4-11/2    Authorization - Visit Number 5    Authorization - Number of Visits 8    Progress Note Due on Visit 8    PT Start Time 1507    PT Stop Time 1546    PT Time Calculation (min) 39 min    Activity Tolerance Patient tolerated treatment well    Behavior During Therapy [facility] for tasks assessed/performed            Past Medical History:  Diagnosis Date   Anxiety    [employer] 12/30/2019   Asthma    COPD (chronic obstructive pulmonary disease) (HCC)    Depression    Dyspnea    GERD (gastroesophageal reflux disease)    Hidradenitis suppurativa    History of hiatal hernia    Hyperlipidemia    Hypertension    IBS (irritable bowel syndrome)    Intervertebral disc disorder with myelopathy, cervical region    OAB (overactive bladder)    Tobacco use    Vitamin B 12 deficiency    Vitamin D  deficiency    Past Surgical History:  Procedure Laterality Date   BIOPSY  05/22/2022   Procedure: BIOPSY;  Surgeon: Eartha Angelia Sieving, MD;  [facility];  Service: Gastroenterology;;   BIOPSY  11/26/2022   Procedure: BIOPSY;  Surgeon: Eartha Angelia Sieving, MD;  [facility];  Service: Gastroenterology;;   ORIN MEDIATE RELEASE Right 04/12/2024   Procedure: Right side carpal tunnel release with ultrasound guidance;  Surgeon: Claudene Penne ORN, MD;  Location: [facility];  Service: Neurosurgery;  Laterality: Right;  Right side carpal tunnel release with ultrasound guidance, possible convert to open   CERVICAL ANTERIOR DISC ARTHROPLASTY, 2 LEVEL N/A 11/10/2023   Procedure: CERVICAL ANTERIOR  DISC ARTHROPLASTY, 2 LEVEL;  Surgeon: Claudene Penne ORN, MD;  Location: [facility];  Service: Neurosurgery;  Laterality: N/A;   CERVICAL DISC ARTHROPLASTY  11/10/2023   Procedure: CERVICAL ANTERIOR DISC ARTHROPLASTY Levels 5-7;  Surgeon: Claudene Penne ORN, MD;  Location: [facility];  Service: Neurosurgery;;   CHOLECYSTECTOMY     COLONOSCOPY WITH PROPOFOL  N/A 05/22/2022   Procedure: COLONOSCOPY WITH PROPOFOL ;  Surgeon: Eartha Angelia Sieving, MD;  [facility];  Service: Gastroenterology;  Laterality: N/A;  1215 ASA 2   ESOPHAGOGASTRODUODENOSCOPY (EGD) WITH PROPOFOL  N/A 11/26/2022   Procedure: ESOPHAGOGASTRODUODENOSCOPY (EGD) WITH PROPOFOL ;  Surgeon: Eartha Angelia Sieving, MD;  [facility];  Service: Gastroenterology;  Laterality: N/A;  1:00PM;asa 2   POLYPECTOMY  05/22/2022   Procedure: POLYPECTOMY;  Surgeon: Eartha Angelia Sieving, MD;  [facility];  Service: Gastroenterology;;   TUBAL LIGATION     Patient Active Problem List   Diagnosis Date Noted   Carpal tunnel syndrome of right wrist 03/23/2024   Mass of left side of neck 02/12/2024   Leg swelling 02/12/2024   SOB (shortness of breath) 12/03/2023   Cervical radiculopathy 11/10/2023   Intervertebral cervical disc disorder with myelopathy, cervical region 10/21/2023   Left arm weakness 10/21/2023   Hidradenitis suppurativa 08/05/2023   Depression, major, single  episode, moderate (HCC) 08/05/2023   Irritable bowel syndrome with constipation 06/09/2023   Mixed stress and urge urinary incontinence 04/07/2023   Neck pain 04/01/2023   Early satiety 03/01/2023   Overactive bladder 11/05/2022   Diarrhea 05/05/2022   Loss of weight 05/05/2022   Vitamin B12 deficiency 04/12/2020   Vitamin D  deficiency 04/12/2020   Dizziness 04/12/2020   HTN (hypertension) 12/30/2019   Tobacco abuse 12/30/2019   GERD (gastroesophageal reflux disease) 12/30/2019   Spondylosis 12/30/2019    PCP: [name], FNP  REFERRING PROVIDER: Ulis Bottcher, PA-C  REFERRING DIAG: [phone] (ICD-10-CM) - Left arm weakness M50.10 (ICD-10-CM) - Cervical disc disorder with radiculopathy of cervical region  THERAPY DIAG:  Cervicalgia  Impaired function of upper extremity  Decreased ROM of intervertebral discs of cervical spine  Rationale for Evaluation and Treatment: Rehabilitation  ONSET DATE: April 29th  SUBJECTIVE:                                                                                                                                                                                                         SUBJECTIVE STATEMENT: Pt reports 4/10 neck pain, Reports improvements with RUE pain following carpal tunnel release. Reports she started HEP again, reports its slow but getting better, doing her neck ones mostly, holding off on ones that involve arm due to not being sure of precautions. Reports dizzy/lightheaded spells still happening, using walker some still. They changed her BP meds which may be cause. Labs being taken next week hopefully.    Evaluation: Patient reports since last episode, she agreed to surgery. Had surgery on cervical spine on April 29th, 2025. Reports previous weakness in LUE is better but now RUE is bad. Goes for carpal tunnel testing tomorrow. Reports her R arm just goes to sleep when she writing. Increased numbness/tingling down UE with cervical flexion but N/T nearly constant.  Hand dominance: Right Reports she recently changed BP mediations, MD put her on a new inhaler, increased dosage of another inhaler and she has discontinued pain medications.   PERTINENT HISTORY:  Chronic cervical pain Cervical Disc Arthroplasty Myelopathy of cervical region    PAIN:  Are you having pain? Yes: NPRS scale: Neck: 4/10, wrist pain: R 7/10 and L just tingling no pain Pain location: Neck and BUE Pain description: constant ache pain in neck, N/T down arms  Aggravating  factors: cervical flexion and/or rotation Relieving factors: Rest, No motion, heat/hot shower, Gabapentin   PRECAUTIONS: None  RED FLAGS: Numbness tingling down both UE     WEIGHT BEARING  RESTRICTIONS: No  FALLS:  Has patient fallen in last 6 months? Yes. Number of falls 1  OCCUPATION: N/A  PLOF: Assisted with iADLs, independent with ADLs mostly but intermittent assist with lower body cleaning due to dizziness, intermittent assist with washing hair due to UE weakness  PATIENT GOALS: To strengthen arms and to try to relieve tension in neck because its so sore  NEXT MD VISIT: September 10th, 2025  OBJECTIVE:  Note: Objective measures were completed at Evaluation unless otherwise noted.  DIAGNOSTIC FINDINGS:  FINDINGS: Normal alignment. C5-C6 and C6-C7 disc arthroplasty in unchanged alignment. No periprosthetic lucency. Remaining disc spaces are normal. No fracture. No prevertebral soft tissue thickening.   IMPRESSION: C5-C6 and C6-C7 disc arthroplasty without complication.    PATIENT SURVEYS:  Neck Disability Index score: 33 / 50 = 66.0 % Upper Extremity Function Scale: 48 / 80 = 60.0 %    COGNITION: Overall cognitive status: Within functional limits for tasks assessed  SENSATION: Light touch: Impaired  Dec sensation throughout R cerv dermatomes   POSTURE: rounded shoulders and forward head  PALPATION: Very TTP with grade 1 CPA/UPA throughout cervical and upper thoracic spin    CERVICAL ROM:   Active ROM A/PROM (deg) eval  Flexion 15 deg, pain in neck   Extension 30 deg, pain in neck  Right lateral flexion 25, pain in neck  Left lateral flexion 20, pain in neck  Right rotation 30, pain in neck  Left rotation 35, pain in neck and N/T down RLE   (Blank rows = not tested)  UPPER EXTREMITY ROM:  Active ROM Right eval Left eval  Shoulder flexion 120 120  Shoulder extension    Shoulder abduction 120 120  Shoulder adduction    Shoulder extension     Shoulder internal rotation    Shoulder external rotation    Elbow flexion    Elbow extension    Wrist flexion    Wrist extension    Wrist ulnar deviation    Wrist radial deviation    Wrist pronation    Wrist supination     (Blank rows = not tested)  UPPER EXTREMITY MMT:  MMT Right eval Left eval  Shoulder flexion 4- 4-  Shoulder extension    Shoulder abduction 4- 4-  Shoulder adduction    Shoulder extension    Shoulder internal rotation 4- 4-  Shoulder external rotation 4- 4-  Middle trapezius    Lower trapezius    Elbow flexion    Elbow extension    Wrist flexion    Wrist extension    Wrist ulnar deviation    Wrist radial deviation    Wrist pronation    Wrist supination    Grip strength     (Blank rows = not tested)  CERVICAL SPECIAL TESTS:  Distraction test: Positive Decrease in N/T down RUE  FUNCTIONAL TESTS:    TREATMENT DATE:  04/21/24: Pt in semi-reclined position with MHP donned to cervical spine for pain relief during: -supine chin tucks, 10x -chin tucks + active cervical flexion, 10x -chin tucks + active cervical extension, 10x -R/L cerv rotation, 10x each direction -R/L lateral flexion, 10x -Decompression exercises, 1-3, 3 holds, 10 reps of exercise 2, 5 reps of exercise 3   04/19/24 Seated:  cervical AROM 10X each direction  Chin tucks with scapular retraction 10X3 holds Standing doorway stretch for chest mm 3X30  UE flexion against wall 10X Seated manual to bil UT   04/08/2024  Manual Therapy: -Segmental lateral  flexion glides of segments C3-C7, grade II-III mobilizations -STM of Cervical Paraspinal musculature on R side Therapeutic Exercise: -Cervical rotation snags, 1 set of 8 reps bilaterally, pt cued for proper set up -Cervical extension snags, 1 set of 8 reps, pt cued for decreased shoulder elevation -Seated chin tucks and scapular retractions, 1 sets of 5 reps, 3 second holds -Supine chin tucks 1 sets of 10 reps, 3 second  holds, pt cued for max pain free ROM -Standing shoulder rows/extensions, GTB above chest level, 1 set of 10 reps, pt cued for increased ROM -Lat pull downs, 1 set of 10 reps, plate 4 pt cued for scapular stabilization -Green ball roll up wall, 2 sets of 4 reps, pt cued for increased ROM   PATIENT EDUCATION:  Education details: PT evaluation, objective findings, POC, Importance of HEP, Precautions, Clinic policies  Person educated: Patient Education method: Explanation and Demonstration Education comprehension: verbalized understanding and returned demonstration  HOME EXERCISE PROGRAM: Access Code: [email or web address] URL: [email or web address] Date: 03/17/2024 Prepared by: Rosaria Powell-Butler  Exercises - Seated Levator Scapulae Stretch  - 2 x daily - 7 x weekly - 3 sets - 30 hold - Seated Upper Trapezius Stretch  - 2 x daily - 7 x weekly - 3 sets - 30 hold - Seated Scapular Retraction  - 2 x daily - 7 x weekly - 3 sets - 10 reps - Supine Cervical Rotation AROM on Pillow  - 2 x daily - 7 x weekly - 3 sets - 10 reps - Supine Chin Tuck  - 2 x daily - 7 x weekly - 3 sets - 10 reps  03/29/24:  cervical AROM 10X each direction  04/21/24: Decompression Exercises 1-3    ASSESSMENT:  CLINICAL IMPRESSION: Patient arrives to session with mild-moderate pain in cervical musculature but decreased pain in RUE following R carpal tunnel release a week and 2 days ago. Session focused on pain reduction. Moist heat pack donned throughout during active motion in gravity minimized position. Added decompression exercises 1-3 in session with pt tolerating well. Reports she feels some relief with exercise 3.  Patient given handout of dry needling, as this may help with her current pain. Patient would benefit from skilled physical therapy for decreased neck pain frequency and intensity, increased strength in cervical paraspinal and other postural musculature, and improved cervical mobility for improved  ability to work without symptom reproduction, return to higher level of function with ADLs, and progress towards therapy goals.     OBJECTIVE IMPAIRMENTS: decreased mobility, decreased ROM, decreased strength, impaired UE functional use, improper body mechanics, postural dysfunction, and pain.   ACTIVITY LIMITATIONS: carrying, lifting, sleeping, dressing, reach over head, and hygiene/grooming  PARTICIPATION LIMITATIONS: meal prep, cleaning, laundry, driving, community activity, and occupation  PERSONAL FACTORS: Time since onset of injury/illness/exacerbation are also affecting patient's functional outcome.   REHAB POTENTIAL: Good  CLINICAL DECISION MAKING: Stable/uncomplicated  EVALUATION COMPLEXITY: Low   GOALS: Goals reviewed with patient? Yes  SHORT TERM GOALS: Target date: 04/07/24 Patient will be independent with performance of HEP to demonstrate adequate self management of symptoms.  Baseline:  Goal status: IN PROGRESS  2.   Patient will report at least a 25% improvement with function and/or pain reduction overall since beginning PT. Baseline:  Goal status: IN PROGRESS   LONG TERM GOALS: Target date: 04/28/24 Patient will improve NDI score by 18  points to demonstrate improved perceived function. Baseline: See above Goal status: IN PROGRESS  2.  Patient will improve cervical extension  ROM to at least 70 degrees to show improved neck mobility for safe performance of functional tasks such as looking upward to higher cabinets.  Baseline: See above Goal status: IN PROGRESS  3.  Patient will improve cervical rotation ROM to at least 70 degrees bilaterally to show improved neck mobility for safe performance of functional tasks such as driving.  Baseline: See above Goal status: IN PROGRESS  4. Patient will improve shoulder flexion ROM by at least 20 degrees bilaterally in order to demonstrate improved shoulder mobility needed for functional tasks such as reaching overhead  or washing hair. Baseline: See above Goal status: IN PROGRESS  5.  Patient will improve shoulder flexion MMT bilaterally by at least 1/2 a grade with reports of decreased pain to show increased UE strength. Baseline: See above Goal status: IN PROGRESS    PLAN:  PT FREQUENCY: 2x/week  PT DURATION: 6 weeks  PLANNED INTERVENTIONS: [id number]- PT Re-evaluation, 97110-Therapeutic exercises, 97530- Therapeutic activity, [id number]- Neuromuscular re-education, 97535- Self Care, [id number]- Manual therapy, [id number]- Gait training, [phone]- Electrical stimulation (manual), [id number]- Traction (mechanical), 20560 (1-2 muscles), 20561 (3+ muscles)- Dry Needling, Patient/Family education, Balance training, Stair training, Taping, Joint mobilization, Spinal mobilization, Cryotherapy, and Moist heat  PLAN FOR NEXT SESSION: continue to  prog cervical mobility/strength, address UE mobility/strengthening, manual techniques as needed: STM, distraction, mobilizations.  F/U with Rt wrist restrictions following MD appt 10/13.    3:51 PM, 04/21/24 Rosaria Settler, PT, DPT [facility]

## 2024-04-21 NOTE — Patient Instructions (Signed)

## 2024-04-24 ENCOUNTER — Other Ambulatory Visit: Payer: Self-pay | Admitting: Family Medicine

## 2024-04-25 ENCOUNTER — Ambulatory Visit (INDEPENDENT_AMBULATORY_CARE_PROVIDER_SITE_OTHER): Admitting: Physician Assistant

## 2024-04-25 ENCOUNTER — Encounter: Payer: Self-pay | Admitting: Physician Assistant

## 2024-04-25 VITALS — BP 124/86 | Temp 98.3°F | Wt 233.6 lb

## 2024-04-25 DIAGNOSIS — G5601 Carpal tunnel syndrome, right upper limb: Secondary | ICD-10-CM

## 2024-04-25 DIAGNOSIS — Z09 Encounter for follow-up examination after completed treatment for conditions other than malignant neoplasm: Secondary | ICD-10-CM

## 2024-04-25 NOTE — Progress Notes (Signed)
   REFERRING PHYSICIAN:  Del Wilhelmena Lloyd Sola, Fnp [phone] [address] [place],  [place] [address]  DOS: 04/12/24, R US  guided CTR  HISTORY OF PRESENT ILLNESS: [name] is 2 weeks status post right ultrasound-guided carpal tunnel release. Overall, she is doing very well.  The majority of the tingling she was experiencing in her hand has stopped.  She continues to have some tingling in the tips of her fingers.  She feels as though her strength has started to improve and her pain has decreased.  PHYSICAL EXAMINATION:  NEUROLOGICAL:  General: In no acute distress.   Awake, alert, oriented to person, place, and time.  Pupils equal round and reactive to light.  Facial tone is symmetric.    Strength: Right hand strength is to baseline  Incision c/d/I.  Small area of scabbing at incision site.  No surrounding erythema or drainage  Imaging:  No new imaging prior to his appointment  Assessment / Plan: [name] is doing well after right ultrasound-guided carpal tunnel release on 04/12/2024. We discussed activity escalation and I have advised the patient to lift up to 10 pounds until 6 weeks after surgery, then increase up to 25 pounds until 12 weeks after surgery.  After 12 weeks post-op, the patient advised to increase activity as tolerated. she will return to clinic in approximately 1 month for her 6-week postop visit.    Advised to contact the office if any questions or concerns arise.   Lyle Decamp PA-C Dept of Neurosurgery

## 2024-04-26 ENCOUNTER — Ambulatory Visit (INDEPENDENT_AMBULATORY_CARE_PROVIDER_SITE_OTHER)

## 2024-04-26 ENCOUNTER — Encounter (HOSPITAL_COMMUNITY): Payer: Self-pay

## 2024-04-26 DIAGNOSIS — R6889 Other general symptoms and signs: Secondary | ICD-10-CM

## 2024-04-26 DIAGNOSIS — M542 Cervicalgia: Secondary | ICD-10-CM | POA: Diagnosis not present

## 2024-04-26 DIAGNOSIS — M5382 Other specified dorsopathies, cervical region: Secondary | ICD-10-CM

## 2024-04-26 DIAGNOSIS — R200 Anesthesia of skin: Secondary | ICD-10-CM | POA: Diagnosis not present

## 2024-04-26 DIAGNOSIS — R202 Paresthesia of skin: Secondary | ICD-10-CM | POA: Diagnosis not present

## 2024-04-26 NOTE — Therapy (Signed)
 OUTPATIENT PHYSICAL THERAPY CERVICAL TREATMENT   Patient Name: CELE MOTE MRN: [id number] DOB:[birth year], 48 y.o., female Today's Date: 04/26/2024  END OF SESSION:  PT End of Session - 04/26/24 1245     Visit Number 7    Number of Visits 9    Date for Recertification  04/28/24    Authorization Type [place] [employer]    Authorization Time Period 8 visits 9/4-11/2    Authorization - Visit Number 6    Authorization - Number of Visits 8    Progress Note Due on Visit 8    PT Start Time 1245    PT Stop Time 1325    PT Time Calculation (min) 40 min    Activity Tolerance Patient tolerated treatment well    Behavior During Therapy [facility] for tasks assessed/performed             Past Medical History:  Diagnosis Date   Anxiety    [employer] 12/30/2019   Asthma    COPD (chronic obstructive pulmonary disease) (HCC)    Depression    Dyspnea    GERD (gastroesophageal reflux disease)    Hidradenitis suppurativa    History of hiatal hernia    Hyperlipidemia    Hypertension    IBS (irritable bowel syndrome)    Intervertebral disc disorder with myelopathy, cervical region    OAB (overactive bladder)    Tobacco use    Vitamin B 12 deficiency    Vitamin D  deficiency    Past Surgical History:  Procedure Laterality Date   BIOPSY  05/22/2022   Procedure: BIOPSY;  Surgeon: Eartha Angelia Sieving, MD;  [facility];  Service: Gastroenterology;;   BIOPSY  11/26/2022   Procedure: BIOPSY;  Surgeon: Eartha Angelia Sieving, MD;  [facility]: [facility];  Service: Gastroenterology;;   ORIN MEDIATE RELEASE Right 04/12/2024   Procedure: Right side carpal tunnel release with ultrasound guidance;  Surgeon: Claudene Penne ORN, MD;  Location: [facility];  Service: Neurosurgery;  Laterality: Right;  Right side carpal tunnel release with ultrasound guidance, possible convert to open   CERVICAL ANTERIOR DISC ARTHROPLASTY, 2 LEVEL N/A 11/10/2023   Procedure: CERVICAL  ANTERIOR DISC ARTHROPLASTY, 2 LEVEL;  Surgeon: Claudene Penne ORN, MD;  Location: [facility];  Service: Neurosurgery;  Laterality: N/A;   CERVICAL DISC ARTHROPLASTY  11/10/2023   Procedure: CERVICAL ANTERIOR DISC ARTHROPLASTY Levels 5-7;  Surgeon: Claudene Penne ORN, MD;  Location: [facility];  Service: Neurosurgery;;   CHOLECYSTECTOMY     COLONOSCOPY WITH PROPOFOL  N/A 05/22/2022   Procedure: COLONOSCOPY WITH PROPOFOL ;  Surgeon: Eartha Angelia Sieving, MD;  [facility]: [facility];  Service: Gastroenterology;  Laterality: N/A;  1215 ASA 2   ESOPHAGOGASTRODUODENOSCOPY (EGD) WITH PROPOFOL  N/A 11/26/2022   Procedure: ESOPHAGOGASTRODUODENOSCOPY (EGD) WITH PROPOFOL ;  Surgeon: Eartha Angelia Sieving, MD;  [facility]: [facility];  Service: Gastroenterology;  Laterality: N/A;  1:00PM;asa 2   POLYPECTOMY  05/22/2022   Procedure: POLYPECTOMY;  Surgeon: Eartha Angelia Sieving, MD;  [facility]: [facility];  Service: Gastroenterology;;   TUBAL LIGATION     Patient Active Problem List   Diagnosis Date Noted   Carpal tunnel syndrome of right wrist 03/23/2024   Mass of left side of neck 02/12/2024   Leg swelling 02/12/2024   SOB (shortness of breath) 12/03/2023   Cervical radiculopathy 11/10/2023   Intervertebral cervical disc disorder with myelopathy, cervical region 10/21/2023   Left arm weakness 10/21/2023   Hidradenitis suppurativa 08/05/2023   Depression, major,  single episode, moderate (HCC) 08/05/2023   Irritable bowel syndrome with constipation 06/09/2023   Mixed stress and urge urinary incontinence 04/07/2023   Neck pain 04/01/2023   Early satiety 03/01/2023   Overactive bladder 11/05/2022   Diarrhea 05/05/2022   Loss of weight 05/05/2022   Vitamin B12 deficiency 04/12/2020   Vitamin D  deficiency 04/12/2020   Dizziness 04/12/2020   HTN (hypertension) 12/30/2019   Tobacco abuse 12/30/2019   GERD (gastroesophageal reflux disease) 12/30/2019   Spondylosis 12/30/2019    PCP: [name], FNP  REFERRING PROVIDER: Ulis Bottcher, PA-C  REFERRING DIAG: [phone] (ICD-10-CM) - Left arm weakness M50.10 (ICD-10-CM) - Cervical disc disorder with radiculopathy of cervical region  THERAPY DIAG:  Cervicalgia  Impaired function of upper extremity  Decreased ROM of intervertebral discs of cervical spine  Numbness and tingling of both upper extremities  Rationale for Evaluation and Treatment: Rehabilitation  ONSET DATE: April 29th  SUBJECTIVE:                                                                                                                                                                                                         SUBJECTIVE STATEMENT: Pt states doctor states to take it pretty easy for about 6 weeks, surgery was September the 30th. This was the first follow up since the surgery. Pt states the neck is not hurting at this time but low back is hurting about a 9/10 pain. Pt states she has felt a big improvement with decreased tension in bilateral upper back.    Evaluation: Patient reports since last episode, she agreed to surgery. Had surgery on cervical spine on April 29th, 2025. Reports previous weakness in LUE is better but now RUE is bad. Goes for carpal tunnel testing tomorrow. Reports her R arm just goes to sleep when she writing. Increased numbness/tingling down UE with cervical flexion but N/T nearly constant.  Hand dominance: Right Reports she recently changed BP mediations, MD put her on a new inhaler, increased dosage of another inhaler and she has discontinued pain medications.   PERTINENT HISTORY:  Chronic cervical pain Cervical Disc Arthroplasty Myelopathy of cervical region    PAIN:  Are you having pain? Yes: NPRS scale: Neck: 4/10, wrist pain: R 7/10 and L just tingling no pain Pain location: Neck and BUE Pain description: constant ache pain in neck, N/T down arms  Aggravating factors: cervical flexion and/or  rotation Relieving factors: Rest, No motion, heat/hot shower, Gabapentin   PRECAUTIONS: None  RED FLAGS: Numbness tingling down both UE  WEIGHT BEARING RESTRICTIONS: No  FALLS:  Has patient fallen in last 6 months? Yes. Number of falls 1  OCCUPATION: N/A  PLOF: Assisted with iADLs, independent with ADLs mostly but intermittent assist with lower body cleaning due to dizziness, intermittent assist with washing hair due to UE weakness  PATIENT GOALS: To strengthen arms and to try to relieve tension in neck because its so sore  NEXT MD VISIT: September 10th, 2025  OBJECTIVE:  Note: Objective measures were completed at Evaluation unless otherwise noted.  DIAGNOSTIC FINDINGS:  FINDINGS: Normal alignment. C5-C6 and C6-C7 disc arthroplasty in unchanged alignment. No periprosthetic lucency. Remaining disc spaces are normal. No fracture. No prevertebral soft tissue thickening.   IMPRESSION: C5-C6 and C6-C7 disc arthroplasty without complication.    PATIENT SURVEYS:  Neck Disability Index score: 33 / 50 = 66.0 % Upper Extremity Function Scale: 48 / 80 = 60.0 %    COGNITION: Overall cognitive status: Within functional limits for tasks assessed  SENSATION: Light touch: Impaired  Dec sensation throughout R cerv dermatomes   POSTURE: rounded shoulders and forward head  PALPATION: Very TTP with grade 1 CPA/UPA throughout cervical and upper thoracic spin    CERVICAL ROM:   Active ROM A/PROM (deg) eval  Flexion 15 deg, pain in neck   Extension 30 deg, pain in neck  Right lateral flexion 25, pain in neck  Left lateral flexion 20, pain in neck  Right rotation 30, pain in neck  Left rotation 35, pain in neck and N/T down RLE   (Blank rows = not tested)  UPPER EXTREMITY ROM:  Active ROM Right eval Left eval  Shoulder flexion 120 120  Shoulder extension    Shoulder abduction 120 120  Shoulder adduction    Shoulder extension    Shoulder internal rotation     Shoulder external rotation    Elbow flexion    Elbow extension    Wrist flexion    Wrist extension    Wrist ulnar deviation    Wrist radial deviation    Wrist pronation    Wrist supination     (Blank rows = not tested)  UPPER EXTREMITY MMT:  MMT Right eval Left eval  Shoulder flexion 4- 4-  Shoulder extension    Shoulder abduction 4- 4-  Shoulder adduction    Shoulder extension    Shoulder internal rotation 4- 4-  Shoulder external rotation 4- 4-  Middle trapezius    Lower trapezius    Elbow flexion    Elbow extension    Wrist flexion    Wrist extension    Wrist ulnar deviation    Wrist radial deviation    Wrist pronation    Wrist supination    Grip strength     (Blank rows = not tested)  CERVICAL SPECIAL TESTS:  Distraction test: Positive Decrease in N/T down RUE  FUNCTIONAL TESTS:    TREATMENT DATE:  04/26/2024  Therapeutic Exercise: -Cervical rotation snags, 1 set of 10 reps bilaterally, pt demonstrates minimal cueing for proper set up -Cervical extension snags, 1 set of 10 reps, pt cued for decreased shoulder elevation -Seated chin tucks and scapular retractions, 1 sets of 8 reps, 3 second holds -Supine cervical rotation, AROM with MHP applied to back for pain management -Supine cervical chin tucks 1 sets of 10 reps, 3 second holds, pt cued for max pain free ROM -Bilateral ER, RTB, 1 set of 10 reps, pt cued for increased ROM and careful with wrist -Seated horizontal and  diagonal adductions, 1 set of 10 reps each variation, pt cued for decreased wrist extension and pain free ROM for wrist    04/21/24: Pt in semi-reclined position with MHP donned to cervical spine for pain relief during: -supine chin tucks, 10x -chin tucks + active cervical flexion, 10x -chin tucks + active cervical extension, 10x -R/L cerv rotation, 10x each direction -R/L lateral flexion, 10x -Decompression exercises, 1-3, 3 holds, 10 reps of exercise 2, 5 reps of exercise  3   04/19/24 Seated:  cervical AROM 10X each direction  Chin tucks with scapular retraction 10X3 holds Standing doorway stretch for chest mm 3X30  UE flexion against wall 10X Seated manual to bil UT   PATIENT EDUCATION:  Education details: PT evaluation, objective findings, POC, Importance of HEP, Precautions, Clinic policies  Person educated: Patient Education method: Explanation and Demonstration Education comprehension: verbalized understanding and returned demonstration  HOME EXERCISE PROGRAM: Access Code: [email or web address] URL: [email or web address] Date: 03/17/2024 Prepared by: Rosaria Powell-Butler  Exercises - Seated Levator Scapulae Stretch  - 2 x daily - 7 x weekly - 3 sets - 30 hold - Seated Upper Trapezius Stretch  - 2 x daily - 7 x weekly - 3 sets - 30 hold - Seated Scapular Retraction  - 2 x daily - 7 x weekly - 3 sets - 10 reps - Supine Cervical Rotation AROM on Pillow  - 2 x daily - 7 x weekly - 3 sets - 10 reps - Supine Chin Tuck  - 2 x daily - 7 x weekly - 3 sets - 10 reps  03/29/24:  cervical AROM 10X each direction  04/21/24: Decompression Exercises 1-3    ASSESSMENT:  CLINICAL IMPRESSION: Patient demonstrates a significant decrease in neck pain today, pt low back on the other hand has increased significantly. Pt demonstrates decreased gait quality and functional mobility. Held manual therapy to neck region as there was no reported pain this date. Patient also demonstrates increased difficulty with functional tranfers (STS) due to low back pain. Patient able to continue dynamic movement and cervical spine activation exercises today with AAROM snags and resistance band activities for bilateral upper trapezius musculature, good performance with verbal cueing. Patient would continue to benefit from skilled physical therapy for continued decrease in neck pain, increased endurance with community mobility requiring cervical mobility, and increased cervical  ROM/strength, for improved quality of life, improved independence with management of neck pain and continued progress towards therapy goals. Probable discharge in the next couple of visits due to pts primary concern being low back pain at this time, reassessment to be performed next session.       OBJECTIVE IMPAIRMENTS: decreased mobility, decreased ROM, decreased strength, impaired UE functional use, improper body mechanics, postural dysfunction, and pain.   ACTIVITY LIMITATIONS: carrying, lifting, sleeping, dressing, reach over head, and hygiene/grooming  PARTICIPATION LIMITATIONS: meal prep, cleaning, laundry, driving, community activity, and occupation  PERSONAL FACTORS: Time since onset of injury/illness/exacerbation are also affecting patient's functional outcome.   REHAB POTENTIAL: Good  CLINICAL DECISION MAKING: Stable/uncomplicated  EVALUATION COMPLEXITY: Low   GOALS: Goals reviewed with patient? Yes  SHORT TERM GOALS: Target date: 04/07/24 Patient will be independent with performance of HEP to demonstrate adequate self management of symptoms.  Baseline:  Goal status: IN PROGRESS  2.   Patient will report at least a 25% improvement with function and/or pain reduction overall since beginning PT. Baseline:  Goal status: IN PROGRESS   LONG TERM GOALS: Target date: 04/28/24 Patient will improve NDI  score by 18  points to demonstrate improved perceived function. Baseline: See above Goal status: IN PROGRESS  2.  Patient will improve cervical extension ROM to at least 70 degrees to show improved neck mobility for safe performance of functional tasks such as looking upward to higher cabinets.  Baseline: See above Goal status: IN PROGRESS  3.  Patient will improve cervical rotation ROM to at least 70 degrees bilaterally to show improved neck mobility for safe performance of functional tasks such as driving.  Baseline: See above Goal status: IN PROGRESS  4. Patient will  improve shoulder flexion ROM by at least 20 degrees bilaterally in order to demonstrate improved shoulder mobility needed for functional tasks such as reaching overhead or washing hair. Baseline: See above Goal status: IN PROGRESS  5.  Patient will improve shoulder flexion MMT bilaterally by at least 1/2 a grade with reports of decreased pain to show increased UE strength. Baseline: See above Goal status: IN PROGRESS    PLAN:  PT FREQUENCY: 2x/week  PT DURATION: 6 weeks  PLANNED INTERVENTIONS: [id number]- PT Re-evaluation, 97110-Therapeutic exercises, 97530- Therapeutic activity, [id number]- Neuromuscular re-education, 97535- Self Care, [id number]- Manual therapy, [id number]- Gait training, [phone]- Electrical stimulation (manual), [id number]- Traction (mechanical), 20560 (1-2 muscles), 20561 (3+ muscles)- Dry Needling, Patient/Family education, Balance training, Stair training, Taping, Joint mobilization, Spinal mobilization, Cryotherapy, and Moist heat  PLAN FOR NEXT SESSION: continue to  prog cervical mobility/strength, address UE mobility/strengthening, manual techniques as needed: STM, distraction, mobilizations.  F/U with Rt wrist restrictions following MD appt 10/13.    Lang Ada, PT, DPT [facility] Office: [phone] 2:53 PM, 04/26/24

## 2024-04-27 ENCOUNTER — Encounter (INDEPENDENT_AMBULATORY_CARE_PROVIDER_SITE_OTHER): Payer: Self-pay | Admitting: Gastroenterology

## 2024-04-28 ENCOUNTER — Ambulatory Visit (HOSPITAL_COMMUNITY)

## 2024-04-28 ENCOUNTER — Encounter (HOSPITAL_COMMUNITY): Payer: Self-pay

## 2024-04-28 DIAGNOSIS — R200 Anesthesia of skin: Secondary | ICD-10-CM

## 2024-04-28 DIAGNOSIS — R6889 Other general symptoms and signs: Secondary | ICD-10-CM

## 2024-04-28 DIAGNOSIS — M542 Cervicalgia: Secondary | ICD-10-CM

## 2024-04-28 DIAGNOSIS — M5382 Other specified dorsopathies, cervical region: Secondary | ICD-10-CM | POA: Diagnosis not present

## 2024-04-28 DIAGNOSIS — R202 Paresthesia of skin: Secondary | ICD-10-CM | POA: Diagnosis not present

## 2024-04-28 NOTE — Therapy (Signed)
 OUTPATIENT PHYSICAL THERAPY CERVICAL TREATMENT PHYSICAL THERAPY DISCHARGE SUMMARY  Visits from Start of Care: 7  Current functional level related to goals / functional outcomes: See below   Remaining deficits: See below   Education / Equipment: See below   Patient agrees to discharge. Patient goals were partially met. Patient is being discharged due to being pleased with the current functional level.   Patient Name: [name] MRN: [id number] DOB:1976-03-22, 48 y.o., female Today's Date: 04/28/2024  END OF SESSION:  PT End of Session - 04/28/24 1245     Visit Number 8    Number of Visits 9    Date for Recertification  04/28/24    Authorization Type [employer]    Authorization Time Period 8 visits 9/4-11/2    Authorization - Visit Number 7    Authorization - Number of Visits 8    Progress Note Due on Visit 8    PT Start Time 1245    PT Stop Time 1319    PT Time Calculation (min) 34 min    Activity Tolerance Patient tolerated treatment well;Patient limited by pain    Behavior During Therapy [facility] for tasks assessed/performed             Past Medical History:  Diagnosis Date   Anxiety    [employer] 12/30/2019   Asthma    COPD (chronic obstructive pulmonary disease) (HCC)    Depression    Dyspnea    GERD (gastroesophageal reflux disease)    Hidradenitis suppurativa    History of hiatal hernia    Hyperlipidemia    Hypertension    IBS (irritable bowel syndrome)    Intervertebral disc disorder with myelopathy, cervical region    OAB (overactive bladder)    Tobacco use    Vitamin B 12 deficiency    Vitamin D  deficiency    Past Surgical History:  Procedure Laterality Date   BIOPSY  05/22/2022   Procedure: BIOPSY;  Surgeon: Eartha Angelia Sieving, MD;  [facility];  Service: Gastroenterology;;   BIOPSY  11/26/2022   Procedure: BIOPSY;  Surgeon: Eartha Angelia Sieving, MD;  [facility]: [facility];  Service:  Gastroenterology;;   ORIN MEDIATE RELEASE Right 04/12/2024   Procedure: Right side carpal tunnel release with ultrasound guidance;  Surgeon: Claudene Penne ORN, MD;  Location: [facility];  Service: Neurosurgery;  Laterality: Right;  Right side carpal tunnel release with ultrasound guidance, possible convert to open   CERVICAL ANTERIOR DISC ARTHROPLASTY, 2 LEVEL N/A 11/10/2023   Procedure: CERVICAL ANTERIOR DISC ARTHROPLASTY, 2 LEVEL;  Surgeon: Claudene Penne ORN, MD;  Location: [facility];  Service: Neurosurgery;  Laterality: N/A;   CERVICAL DISC ARTHROPLASTY  11/10/2023   Procedure: CERVICAL ANTERIOR DISC ARTHROPLASTY Levels 5-7;  Surgeon: Claudene Penne ORN, MD;  Location: [facility];  Service: Neurosurgery;;   CHOLECYSTECTOMY     COLONOSCOPY WITH PROPOFOL  N/A 05/22/2022   Procedure: COLONOSCOPY WITH PROPOFOL ;  Surgeon: Eartha Angelia Sieving, MD;  [facility]: [facility];  Service: Gastroenterology;  Laterality: N/A;  1215 ASA 2   ESOPHAGOGASTRODUODENOSCOPY (EGD) WITH PROPOFOL  N/A 11/26/2022   Procedure: ESOPHAGOGASTRODUODENOSCOPY (EGD) WITH PROPOFOL ;  Surgeon: Eartha Angelia Sieving, MD;  [facility]: [facility];  Service: Gastroenterology;  Laterality: N/A;  1:00PM;asa 2   POLYPECTOMY  05/22/2022   Procedure: POLYPECTOMY;  Surgeon: Eartha Angelia Sieving, MD;  [facility]: [facility];  Service: Gastroenterology;;   TUBAL LIGATION     Patient Active Problem List   Diagnosis  Date Noted   Carpal tunnel syndrome of right wrist 03/23/2024   Mass of left side of neck 02/12/2024   Leg swelling 02/12/2024   SOB (shortness of breath) 12/03/2023   Cervical radiculopathy 11/10/2023   Intervertebral cervical disc disorder with myelopathy, cervical region 10/21/2023   Left arm weakness 10/21/2023   Hidradenitis suppurativa 08/05/2023   Depression, major, single episode, moderate (HCC) 08/05/2023   Irritable bowel syndrome with constipation 06/09/2023   Mixed stress and urge urinary incontinence  04/07/2023   Neck pain 04/01/2023   Early satiety 03/01/2023   Overactive bladder 11/05/2022   Diarrhea 05/05/2022   Loss of weight 05/05/2022   Vitamin B12 deficiency 04/12/2020   Vitamin D  deficiency 04/12/2020   Dizziness 04/12/2020   HTN (hypertension) 12/30/2019   Tobacco abuse 12/30/2019   GERD (gastroesophageal reflux disease) 12/30/2019   Spondylosis 12/30/2019    PCP: [name], FNP  REFERRING PROVIDER: Ulis Bottcher, PA-C  REFERRING DIAG: [phone] (ICD-10-CM) - Left arm weakness M50.10 (ICD-10-CM) - Cervical disc disorder with radiculopathy of cervical region  THERAPY DIAG:  Cervicalgia  Impaired function of upper extremity  Decreased ROM of intervertebral discs of cervical spine  Numbness and tingling of both upper extremities  Rationale for Evaluation and Treatment: Rehabilitation  ONSET DATE: April 29th  SUBJECTIVE:                                                                                                                                                                                                         SUBJECTIVE STATEMENT: Pt states she has not had a lot of neck pain 1/10. Pt is pleasantly surprised. Her back is still hurting her significantly and has been using a heating pack consistently. Pt states HEP is going well  and can tell they have helped, will continue to do them.    Evaluation: Patient reports since last episode, she agreed to surgery. Had surgery on cervical spine on April 29th, 2025. Reports previous weakness in LUE is better but now RUE is bad. Goes for carpal tunnel testing tomorrow. Reports her R arm just goes to sleep when she writing. Increased numbness/tingling down UE with cervical flexion but N/T nearly constant.  Hand dominance: Right Reports she recently changed BP mediations, MD put her on a new inhaler, increased dosage of another inhaler and she has discontinued pain medications.   PERTINENT HISTORY:   Chronic cervical pain Cervical Disc Arthroplasty Myelopathy of cervical region    PAIN:  Are you having pain? Yes: NPRS scale: Neck: 4/10, wrist pain: R 7/10  and L just tingling no pain Pain location: Neck and BUE Pain description: constant ache pain in neck, N/T down arms  Aggravating factors: cervical flexion and/or rotation Relieving factors: Rest, No motion, heat/hot shower, Gabapentin   PRECAUTIONS: None  RED FLAGS: Numbness tingling down both UE     WEIGHT BEARING RESTRICTIONS: No  FALLS:  Has patient fallen in last 6 months? Yes. Number of falls 1  OCCUPATION: N/A  PLOF: Assisted with iADLs, independent with ADLs mostly but intermittent assist with lower body cleaning due to dizziness, intermittent assist with washing hair due to UE weakness  PATIENT GOALS: To strengthen arms and to try to relieve tension in neck because its so sore  NEXT MD VISIT: September 10th, 2025  OBJECTIVE:  Note: Objective measures were completed at Evaluation unless otherwise noted.  DIAGNOSTIC FINDINGS:  FINDINGS: Normal alignment. C5-C6 and C6-C7 disc arthroplasty in unchanged alignment. No periprosthetic lucency. Remaining disc spaces are normal. No fracture. No prevertebral soft tissue thickening.   IMPRESSION: C5-C6 and C6-C7 disc arthroplasty without complication.    PATIENT SURVEYS:  Neck Disability Index score: 33 / 50 = 66.0 % Upper Extremity Function Scale: 48 / 80 = 60.0 %  04/28/24 NDI:  8 / 50 = 16.0 %  COGNITION: Overall cognitive status: Within functional limits for tasks assessed  SENSATION: Light touch: Impaired  Dec sensation throughout R cerv dermatomes   POSTURE: rounded shoulders and forward head  PALPATION: Very TTP with grade 1 CPA/UPA throughout cervical and upper thoracic spin    CERVICAL ROM:   Active ROM A/PROM (deg) eval AROM  Flexion 15 deg, pain in neck    Extension 30 deg, pain in neck 60 degrees no pain  Right lateral flexion 25,  pain in neck   Left lateral flexion 20, pain in neck   Right rotation 30, pain in neck 68, degrees no pain  Left rotation 35, pain in neck and N/T down RLE 67, degrees no pain   (Blank rows = not tested)  UPPER EXTREMITY ROM:  Active ROM Right eval Left eval Right 04/28/24 Left 04/28/24  Shoulder flexion 120 120 145 158  Shoulder extension      Shoulder abduction 120 120 150 158  Shoulder adduction      Shoulder extension      Shoulder internal rotation      Shoulder external rotation      Elbow flexion      Elbow extension      Wrist flexion      Wrist extension      Wrist ulnar deviation      Wrist radial deviation      Wrist pronation      Wrist supination       (Blank rows = not tested)  UPPER EXTREMITY MMT:  MMT Right eval Left eval Right 04/28/24 Left 04/28/24  Shoulder flexion 4- 4- 4+ 4+  Shoulder extension      Shoulder abduction 4- 4- 4+ 4+  Shoulder adduction      Shoulder extension      Shoulder internal rotation 4- 4- 4+ 4+  Shoulder external rotation 4- 4- 4+ 4+  Middle trapezius      Lower trapezius      Elbow flexion      Elbow extension      Wrist flexion      Wrist extension      Wrist ulnar deviation      Wrist radial deviation  Wrist pronation      Wrist supination      Grip strength       (Blank rows = not tested)  CERVICAL SPECIAL TESTS:  Distraction test: Positive Decrease in N/T down RUE  FUNCTIONAL TESTS:    TREATMENT DATE:  04/28/2024  Progress note: goals reviewed, strength/ROM assessed, NDI completed, education on importance of consistent HEP compliance, POC and referral process.  04/26/2024  Therapeutic Exercise: -Cervical rotation snags, 1 set of 10 reps bilaterally, pt demonstrates minimal cueing for proper set up -Cervical extension snags, 1 set of 10 reps, pt cued for decreased shoulder elevation -Seated chin tucks and scapular retractions, 1 sets of 8 reps, 3 second holds -Supine cervical rotation, AROM with  MHP applied to back for pain management -Supine cervical chin tucks 1 sets of 10 reps, 3 second holds, pt cued for max pain free ROM -Bilateral ER, RTB, 1 set of 10 reps, pt cued for increased ROM and careful with wrist -Seated horizontal and diagonal adductions, 1 set of 10 reps each variation, pt cued for decreased wrist extension and pain free ROM for wrist    04/21/24: Pt in semi-reclined position with MHP donned to cervical spine for pain relief during: -supine chin tucks, 10x -chin tucks + active cervical flexion, 10x -chin tucks + active cervical extension, 10x -R/L cerv rotation, 10x each direction -R/L lateral flexion, 10x -Decompression exercises, 1-3, 3 holds, 10 reps of exercise 2, 5 reps of exercise 3   PATIENT EDUCATION:  Education details: PT evaluation, objective findings, POC, Importance of HEP, Precautions, Clinic policies  Person educated: Patient Education method: Explanation and Demonstration Education comprehension: verbalized understanding and returned demonstration  HOME EXERCISE PROGRAM: Access Code: [email or web address] URL: [email or web address] Date: 03/17/2024 Prepared by: Rosaria Powell-Butler  Exercises - Seated Levator Scapulae Stretch  - 2 x daily - 7 x weekly - 3 sets - 30 hold - Seated Upper Trapezius Stretch  - 2 x daily - 7 x weekly - 3 sets - 30 hold - Seated Scapular Retraction  - 2 x daily - 7 x weekly - 3 sets - 10 reps - Supine Cervical Rotation AROM on Pillow  - 2 x daily - 7 x weekly - 3 sets - 10 reps - Supine Chin Tuck  - 2 x daily - 7 x weekly - 3 sets - 10 reps  03/29/24:  cervical AROM 10X each direction  04/21/24: Decompression Exercises 1-3    ASSESSMENT:  CLINICAL IMPRESSION: Patient continues to demonstrate a significant decrease in neck pain over the past few weeks, pt low back has eased off a little but is still impacting ADL performance and quality of life. Pt demonstrates decreased gait quality and functional mobility.  HEP was review and goals tracked, pt able to meet 5/7 goals since beginning of therapy and good progress towards ROM goals unmet. Patient to be discharged to independent HEP with pt being pleased with current level of function with neck.       OBJECTIVE IMPAIRMENTS: decreased mobility, decreased ROM, decreased strength, impaired UE functional use, improper body mechanics, postural dysfunction, and pain.   ACTIVITY LIMITATIONS: carrying, lifting, sleeping, dressing, reach over head, and hygiene/grooming  PARTICIPATION LIMITATIONS: meal prep, cleaning, laundry, driving, community activity, and occupation  PERSONAL FACTORS: Time since onset of injury/illness/exacerbation are also affecting patient's functional outcome.   REHAB POTENTIAL: Good  CLINICAL DECISION MAKING: Stable/uncomplicated  EVALUATION COMPLEXITY: Low   GOALS: Goals reviewed with patient? Yes  SHORT TERM GOALS: Target date:  04/07/24 Patient will be independent with performance of HEP to demonstrate adequate self management of symptoms.  Baseline:  Goal status: MET  2.   Patient will report at least a 25% improvement with function and/or pain reduction overall since beginning PT. Baseline:  Goal status: MET   LONG TERM GOALS: Target date: 04/28/24 Patient will improve NDI score by 18  points to demonstrate improved perceived function. Baseline: See above Goal status: MET  2.  Patient will improve cervical extension ROM to at least 70 degrees to show improved neck mobility for safe performance of functional tasks such as looking upward to higher cabinets.  Baseline: See above Goal status: IN PROGRESS  3.  Patient will improve cervical rotation ROM to at least 70 degrees bilaterally to show improved neck mobility for safe performance of functional tasks such as driving.  Baseline: See above Goal status: IN PROGRESS  4. Patient will improve shoulder flexion ROM by at least 20 degrees bilaterally in order to  demonstrate improved shoulder mobility needed for functional tasks such as reaching overhead or washing hair. Baseline: See above Goal status: MET  5.  Patient will improve shoulder flexion MMT bilaterally by at least 1/2 a grade with reports of decreased pain to show increased UE strength. Baseline: See above Goal status: MET    PLAN:  PT FREQUENCY: 2x/week  PT DURATION: 6 weeks  PLANNED INTERVENTIONS: [id number]- PT Re-evaluation, 97110-Therapeutic exercises, 97530- Therapeutic activity, 97112- Neuromuscular re-education, 97535- Self Care, [id number]- Manual therapy, [phone]- Gait training, [phone]- Electrical stimulation (manual), [id number]- Traction (mechanical), [phone] (1-2 muscles), [id number] (3+ muscles)- Dry Needling, Patient/Family education, Balance training, Stair training, Taping, Joint mobilization, Spinal mobilization, Cryotherapy, and Moist heat  PLAN FOR NEXT SESSION: discharged, shown a few things for back pain management until she gets in with ortho   Lang Ada, PT, DPT [facility] Office: [phone] 1:24 PM, 04/28/24

## 2024-04-29 ENCOUNTER — Other Ambulatory Visit: Payer: Self-pay | Admitting: Family Medicine

## 2024-04-29 DIAGNOSIS — I10 Essential (primary) hypertension: Secondary | ICD-10-CM

## 2024-05-03 ENCOUNTER — Encounter (HOSPITAL_COMMUNITY): Admitting: Physical Therapy

## 2024-05-05 ENCOUNTER — Ambulatory Visit (INDEPENDENT_AMBULATORY_CARE_PROVIDER_SITE_OTHER)

## 2024-05-05 ENCOUNTER — Encounter: Payer: Self-pay | Admitting: Pulmonary Disease

## 2024-05-05 ENCOUNTER — Ambulatory Visit: Admitting: Pulmonary Disease

## 2024-05-05 ENCOUNTER — Other Ambulatory Visit: Payer: Self-pay | Admitting: Neurosurgery

## 2024-05-05 ENCOUNTER — Encounter (HOSPITAL_COMMUNITY): Admitting: Physical Therapy

## 2024-05-05 VITALS — BP 126/84 | HR 80 | Temp 98.1°F | Ht 62.0 in | Wt 235.0 lb

## 2024-05-05 DIAGNOSIS — E669 Obesity, unspecified: Secondary | ICD-10-CM | POA: Diagnosis not present

## 2024-05-05 DIAGNOSIS — R918 Other nonspecific abnormal finding of lung field: Secondary | ICD-10-CM | POA: Diagnosis not present

## 2024-05-05 DIAGNOSIS — R0989 Other specified symptoms and signs involving the circulatory and respiratory systems: Secondary | ICD-10-CM

## 2024-05-05 DIAGNOSIS — J439 Emphysema, unspecified: Secondary | ICD-10-CM

## 2024-05-05 DIAGNOSIS — F17210 Nicotine dependence, cigarettes, uncomplicated: Secondary | ICD-10-CM

## 2024-05-05 DIAGNOSIS — R0602 Shortness of breath: Secondary | ICD-10-CM

## 2024-05-05 DIAGNOSIS — R0600 Dyspnea, unspecified: Secondary | ICD-10-CM

## 2024-05-05 DIAGNOSIS — Z9189 Other specified personal risk factors, not elsewhere classified: Secondary | ICD-10-CM

## 2024-05-05 DIAGNOSIS — Z6841 Body Mass Index (BMI) 40.0 and over, adult: Secondary | ICD-10-CM

## 2024-05-05 DIAGNOSIS — J449 Chronic obstructive pulmonary disease, unspecified: Secondary | ICD-10-CM

## 2024-05-05 LAB — PULMONARY FUNCTION TEST
DL/VA % pred: 68 %
DL/VA: 2.98 ml/min/mmHg/L
DLCO unc % pred: 63 %
DLCO unc: 12.44 ml/min/mmHg
FEF 25-75 Post: 2.16 L/s
FEF 25-75 Pre: 1.98 L/s
FEF2575-%Change-Post: 9 %
FEF2575-%Pred-Post: 79 %
FEF2575-%Pred-Pre: 72 %
FEV1-%Change-Post: 2 %
FEV1-%Pred-Post: 82 %
FEV1-%Pred-Pre: 80 %
FEV1-Post: 2.2 L
FEV1-Pre: 2.15 L
FEV1FVC-%Change-Post: 0 %
FEV1FVC-%Pred-Pre: 95 %
FEV6-%Change-Post: 1 %
FEV6-%Pred-Post: 87 %
FEV6-%Pred-Pre: 85 %
FEV6-Post: 2.84 L
FEV6-Pre: 2.8 L
FEV6FVC-%Change-Post: 0 %
FEV6FVC-%Pred-Post: 102 %
FEV6FVC-%Pred-Pre: 102 %
FVC-%Change-Post: 2 %
FVC-%Pred-Post: 85 %
FVC-%Pred-Pre: 83 %
FVC-Post: 2.86 L
FVC-Pre: 2.8 L
Post FEV1/FVC ratio: 77 %
Post FEV6/FVC ratio: 100 %
Pre FEV1/FVC ratio: 77 %
Pre FEV6/FVC Ratio: 100 %
RV % pred: 114 %
RV: 1.88 L
TLC % pred: 96 %
TLC: 4.59 L

## 2024-05-05 NOTE — Progress Notes (Signed)
 Subjective:    Patient ID: [name], female    DOB: [birth year], 48 y.o.   MRN: [id number]  Patient Care Team: Del Wilhelmena Lloyd Sola, FNP as PCP - General [facility] Medicine)  Chief Complaint  Patient presents with   Shortness of Breath    Shortness of breath on exertion and at rest. Dry cough.     BACKGROUND/INTERVAL:Patient is a 48 year old current smoker (79.5 PY) who presents for follow-up of shortness of breath and cough.  She was initially seen here on 23 February 2024.  She had PFTs today.  This is a follow-up visit.  HPI Discussed the use of AI scribe software for clinical note transcription with the patient, who gave verbal consent to proceed.  History of Present Illness   [name] is a 48 year old female who presents for follow-up of shortness of breath and multiple lung nodules.  She experiences persistent shortness of breath, particularly at night, which sometimes wakes her up and necessitates the use of her albuterol  inhaler. Despite using Symbicort  and Spiriva , she does not notice a significant improvement in her symptoms. She often has to go outside to catch her breath and sometimes alarms her children with her breathing difficulties.  She experiences difficulty breathing during physical activities, such as walking or during physical therapy for her neck, which requires her to stop and use her inhaler frequently.  She smokes approximately a pack and a half of cigarettes per day and has a family history of smoking, with all family members except her son being smokers.  No snoring or sleep problems, stating she wakes up feeling rested and does not nap during the day. However, she does experience episodes of waking up short of breath at night.  Has been told that she has significant snoring.    She had PFTs today that showed an FEV1 of 2.15 L or 80% predicted, FVC of 2.80 L or 83% predicted, FEV1/FVC of 77%, no bronchodilator response.  Lung  volumes normal with minimal air trapping noted diffusion capacity moderately reduced.  This consistent with mild obstructive airways disease and moderate diffusion defect.  Patient's symptoms are out of proportion to her pulmonary function.  She had a CT chest high-resolution performed 01 April 2024 showing no evidence of interstitial lung disease.  Multiple pulmonary nodules up to 6 mm in the left upper lobe with follow-up chest CT at 3 to 6 months recommended.  Review of Systems A 10 point review of systems was performed and it is as noted above otherwise negative.   Patient Active Problem List   Diagnosis Date Noted   Carpal tunnel syndrome of right wrist 03/23/2024   Mass of left side of neck 02/12/2024   Leg swelling 02/12/2024   SOB (shortness of breath) 12/03/2023   Cervical radiculopathy 11/10/2023   Intervertebral cervical disc disorder with myelopathy, cervical region 10/21/2023   Left arm weakness 10/21/2023   Hidradenitis suppurativa 08/05/2023   Depression, major, single episode, moderate (HCC) 08/05/2023   Irritable bowel syndrome with constipation 06/09/2023   Mixed stress and urge urinary incontinence 04/07/2023   Neck pain 04/01/2023   Early satiety 03/01/2023   Overactive bladder 11/05/2022   Diarrhea 05/05/2022   Loss of weight 05/05/2022   Vitamin B12 deficiency 04/12/2020   Vitamin D  deficiency 04/12/2020   Dizziness 04/12/2020   HTN (hypertension) 12/30/2019   Tobacco abuse 12/30/2019   GERD (gastroesophageal reflux disease) 12/30/2019   Spondylosis 12/30/2019    Social History  Tobacco Use   Smoking status: Every Day    Current packs/day: 1.50    Average packs/day: 2.4 packs/day for 32.8 years (79.8 ttl pk-yrs)    Types: Cigarettes    Start date: 1993    Passive exposure: Current   Smokeless tobacco: Never  Substance Use Topics   Alcohol use: Not Currently    Allergies  Allergen Reactions   Other     laughing gas= skin feels like it  burning    Current Meds  Medication Sig   Cholecalciferol  (VITAMIN D ) 125 MCG (5000 UT) CAPS Take 5,000 Units by mouth daily.   cyclobenzaprine  (FLEXERIL ) 10 MG tablet Take 1 tablet by mouth three times daily as needed for muscle spasm   escitalopram  (LEXAPRO ) 10 MG tablet Take 1 tablet by mouth once daily   furosemide  (LASIX ) 20 MG tablet Take 1 tablet (20 mg total) by mouth as needed for up to 1 dose for edema or fluid. For Leg swelling   linaclotide  (LINZESS ) 145 MCG CAPS capsule Take 1 capsule (145 mcg total) by mouth daily.   metoprolol  tartrate (LOPRESSOR ) 50 MG tablet Take 1 tablet by mouth twice daily   olmesartan  (BENICAR ) 20 MG tablet Take 0.5 tablets (10 mg total) by mouth daily.   Omega-3 Fatty Acids (FISH OIL ) 1000 MG CAPS Take 2 capsules (2,000 mg total) by mouth in the morning and at bedtime.   omeprazole  (PRILOSEC) 40 MG capsule Take 1 capsule (40 mg total) by mouth daily.   ondansetron  (ZOFRAN ) 4 MG tablet TAKE 1 TABLET BY MOUTH EVERY 8 HOURS AS NEEDED FOR NAUSEA OR VOMITING   oxybutynin  (DITROPAN -XL) 10 MG 24 hr tablet Take 10 mg by mouth at bedtime.   polyethylene glycol powder (GLYCOLAX /MIRALAX ) 17 GM/SCOOP powder Take 17 g by mouth daily as needed for moderate constipation. Dissolve 1 capful (17g) in 4-8 ounces of liquid and take by mouth daily.   rosuvastatin  (CRESTOR ) 5 MG tablet Take 1 tablet by mouth once daily   senna (SENOKOT) 8.6 MG TABS tablet Take 1 tablet (8.6 mg total) by mouth 2 (two) times daily as needed for mild constipation.   SYMBICORT  160-4.5 MCG/ACT inhaler Inhale 2 puffs into the lungs 2 (two) times daily.   Tiotropium Bromide Monohydrate  (SPIRIVA  RESPIMAT) 2.5 MCG/ACT AERS Inhale 2 puffs into the lungs daily.   VENTOLIN  HFA 108 (90 Base) MCG/ACT inhaler INHALE 2 PUFFS BY MOUTH EVERY 6 HOURS AS NEEDED FOR WHEEZING OR SHORTNESS OF BREATH   vitamin B-12 (CYANOCOBALAMIN ) 1000 MCG tablet Take 1 tablet (1,000 mcg total) by mouth daily.   [DISCONTINUED]  gabapentin  (NEURONTIN ) 300 MG capsule TAKE 2 CAPSULES BY MOUTH THREE TIMES DAILY    Immunization History  Administered Date(s) Administered   Moderna Sars-Covid-2 Vaccination 10/28/2019, 11/29/2019   Tdap 11/05/2022        Objective:     BP 126/84   Pulse 80   Temp 98.1 F (36.7 C) (Temporal)   Ht 5' 2 (1.575 m)   Wt 235 lb (106.6 kg)   LMP 04/01/2024 (Approximate)   SpO2 98%   BMI 42.98 kg/m   SpO2: 98 %  GENERAL: Obese, fully ambulatory.  No conversational dyspnea. HEAD: Normocephalic, atraumatic.  EYES: Pupils equal, round, reactive to light.  No scleral icterus.  MOUTH: Edentulous, wears dentures.  Mallampati IV airway, oral mucosa moist.  Coated tongue, no thrush. NECK: Supple. No thyromegaly. Trachea midline. No JVD.  No adenopathy. PULMONARY: Good air entry bilaterally.  Wheezing noted. CARDIOVASCULAR: S1 and  S2. Regular rate and rhythm.  ABDOMEN: Significant truncal obesity. MUSCULOSKELETAL: No joint deformity, no clubbing, no edema.  NEUROLOGIC: No overt focal deficit, no gait disturbance, speech is fluent. SKIN: Intact,warm,dry. PSYCH: Mood and behavior normal.      Assessment & Plan:     ICD-10-CM   1. 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 Multiple lung nodules on CT  R91.8 CT CHEST WO CONTRAST    3. At risk for sleep apnea  Z91.89 Split night study    4. Morbid obesity (HCC)  E66.01     5. Tobacco dependence due to cigarettes  F17.210     6. Dyspnea, unspecified type  R06.00 ECHOCARDIOGRAM COMPLETE      Orders Placed This Encounter  Procedures   CT CHEST WO CONTRAST    Standing Status:   Future    Expected Date:   05/08/2025    Expiration Date:   07/06/2025    Scheduling Instructions:     1 year    Is patient pregnant?:   No    Preferred imaging location?:   [facility]   ECHOCARDIOGRAM COMPLETE    Standing Status:   Future    Expected Date:   06/05/2024    Expiration Date:   05/05/2025    Where should  this test be performed:   [facility]    Perflutren DEFINITY (image enhancing agent) should be administered unless hypersensitivity or allergy exist:   Administer Perflutren    Reason for exam-Echo:   Dyspnea  R06.00   Split night study    Standing Status:   Future    Expected Date:   05/19/2024    Expiration Date:   05/05/2025    Where should this test be performed::   [place]   Discussion:    Shortness of breath Shortness of breath, particularly at night and during physical activity, with possible cardiac involvement as lungs are not severely affected. - Order echocardiogram to assess cardiac function - Continue current inhalers: Symbicort , Spiriva , and albuterol  as needed  Suspected sleep apnea Suspected sleep apnea due to crowded airway and episodes of waking up short of breath at night. - Order home sleep study to evaluate for sleep apnea  Mild emphysema Mild emphysema noted on CT scan with mild changes affecting oxygen capacity. - Encourage smoking cessation  Multiple small pulmonary nodules Multiple small pulmonary nodules present with no current signs of malignancy. Not eligible for lung cancer screening program due to age. - Order follow-up CT scan in one year to monitor nodules  Chronic tobacco use Chronic tobacco use with smoking a pack and a half per day. Strong recommendation for smoking cessation to prevent further lung damage. - Strongly encourage smoking cessation  Obesity Obesity may be contributing to shortness of breath and decreased lung function.     Will see the patient in follow-up in 2 months She is to contact us  prior to that time should any new difficulties arise.  Advised if symptoms do not improve or worsen, to please contact office for sooner follow up or seek emergency care.    I spent 34 minutes of dedicated to the care of this patient on the date of this encounter to include pre-visit review of records, face-to-face time with the patient  discussing conditions above, post visit ordering of testing, clinical documentation with the electronic health record, making appropriate referrals as documented, and communicating necessary findings to members of the patients care  team.     KYM Leita Sanders, MD Advanced Bronchoscopy PCCM [facility]    *This note was generated using voice recognition software/Dragon and/or AI transcription program.  Despite best efforts to proofread, errors can occur which can change the meaning. Any transcriptional errors that result from this process are unintentional and may not be fully corrected at the time of dictation.

## 2024-05-05 NOTE — Progress Notes (Signed)
 Full PFT completed today ? ?

## 2024-05-05 NOTE — Patient Instructions (Signed)
 Full PFT completed today ? ?

## 2024-05-05 NOTE — Patient Instructions (Signed)
 VISIT SUMMARY:  Today, we discussed your ongoing shortness of breath and multiple lung nodules. We reviewed your symptoms, current medications, and lifestyle factors, including smoking. We also discussed potential underlying conditions that may be contributing to your symptoms and planned further evaluations.  YOUR PLAN:  -SHORTNESS OF BREATH: Your shortness of breath, especially at night and during physical activity, may have a cardiac component. We will perform an echocardiogram to check your heart function. Continue using your current inhalers: Symbicort , Spiriva , and albuterol  as needed.  -SUSPECTED SLEEP APNEA: Sleep apnea is a condition where breathing stops and starts during sleep. We suspect you might have this due to your symptoms of waking up short of breath at night. We will conduct a home sleep study to evaluate this further.  -MILD EMPHYSEMA: Emphysema is a lung condition that causes shortness of breath. Your CT scan shows mild emphysema, which affects your oxygen capacity. It is crucial to stop smoking to prevent further lung damage.  -MULTIPLE SMALL PULMONARY NODULES: You have multiple small nodules in your lungs, but there are no signs of cancer at this time. We will monitor these nodules with a follow-up CT scan in one year.  -CHRONIC TOBACCO USE: Smoking is causing significant harm to your lungs. It is very important to quit smoking to improve your lung health and overall well-being.  -OBESITY: Your weight may be contributing to your shortness of breath and decreased lung function. Working on weight loss could help improve your symptoms.  INSTRUCTIONS:  1. Schedule an echocardiogram to assess your heart function. 2. Continue using Symbicort , Spiriva , and albuterol  as needed. 3. Arrange for a home sleep study to evaluate for sleep apnea. 4. Plan for a follow-up CT scan in one year to monitor lung nodules. 5. Strongly consider smoking cessation programs to help you quit  smoking. 6. Focus on weight loss strategies to improve your breathing and overall health.

## 2024-05-06 ENCOUNTER — Telehealth: Payer: Self-pay

## 2024-05-06 ENCOUNTER — Other Ambulatory Visit: Payer: Self-pay | Admitting: Physician Assistant

## 2024-05-06 DIAGNOSIS — J449 Chronic obstructive pulmonary disease, unspecified: Secondary | ICD-10-CM

## 2024-05-06 NOTE — Progress Notes (Signed)
 Complex Care Management Note  Care Guide Note 05/06/2024 Name: NANDIKA STETZER MRN: [id number] DOB: 10-14-1975  Eleanor KATHEE Force is a 48 y.o. year old female who sees Del Wilhelmena Falter, [place], FNP for primary care. I reached out to Eleanor KATHEE Force by phone today to offer complex care management services.  Ms. Bratz was given information about Complex Care Management services today including:   The Complex Care Management services include support from the care team which includes your Nurse Care Manager, Clinical Social Worker, or Pharmacist.  The Complex Care Management team is here to help remove barriers to the health concerns and goals most important to you. Complex Care Management services are voluntary, and the patient may decline or stop services at any time by request to their care team member.   Complex Care Management Consent Status: Patient agreed to services and verbal consent obtained.   Follow up plan:  Telephone appointment with complex care management team member scheduled for:  05/19/24 @ 9 AM  Encounter Outcome:  Patient Scheduled  Leotis Rase [facility], [facility]  Direct Dial: [phone]  Fax [phone]

## 2024-05-10 ENCOUNTER — Encounter (HOSPITAL_COMMUNITY): Admitting: Physical Therapy

## 2024-05-13 ENCOUNTER — Other Ambulatory Visit: Payer: Self-pay | Admitting: Family Medicine

## 2024-05-13 DIAGNOSIS — R0602 Shortness of breath: Secondary | ICD-10-CM

## 2024-05-17 ENCOUNTER — Other Ambulatory Visit: Payer: Self-pay

## 2024-05-17 MED ORDER — FUROSEMIDE 20 MG PO TABS: 20.0000 mg | ORAL_TABLET | ORAL | 3 refills | Status: AC | PRN

## 2024-05-18 ENCOUNTER — Other Ambulatory Visit: Payer: Self-pay | Admitting: Family Medicine

## 2024-05-18 DIAGNOSIS — F321 Major depressive disorder, single episode, moderate: Secondary | ICD-10-CM

## 2024-05-19 ENCOUNTER — Other Ambulatory Visit: Payer: Self-pay | Admitting: *Deleted

## 2024-05-19 VITALS — BP 127/80 | Wt 235.0 lb

## 2024-05-19 DIAGNOSIS — R0602 Shortness of breath: Secondary | ICD-10-CM

## 2024-05-19 NOTE — Patient Instructions (Signed)
 Visit Information  Ms. Withem was given information about Medicaid Managed Care team care coordination services as a part of their Healthy [facility] Medicaid benefit. Elayne B Punt   If you would like to schedule transportation through your Healthy [facility] plan, please call the following number at least 2 days in advance of your appointment: [phone]  For information about your ride after you set it up, call Ride Assist at [phone]. Use this number to activate a Will Call pickup, or if your transportation is late for a scheduled pickup. Use this number, too, if you need to make a change or cancel a previously scheduled reservation.  If you need transportation services right away, call [phone]. The after-hours call center is staffed 24 hours to handle ride assistance and urgent reservation requests (including discharges) 365 days a year. Urgent trips include sick visits, hospital discharge requests and life-sustaining treatment.  Call the [facility] Line at [phone], at any time, 24 hours a day, 7 days a week. If you are in danger or need immediate medical attention call 911.   Please see education materials related to COPD/AD provided by MyChart link.  Care plan and visit instructions communicated with the patient verbally today. Patient agrees to receive a copy in MyChart. Active MyChart status and patient understanding of how to access instructions and care plan via MyChart confirmed with patient.     Telephone follow up appointment with Managed Medicaid care management team member scheduled for: 06/02/2024 @ 1:00 pm  Olam Ku, RN, BSN [place]  [facility], [facility] Health RN Care Manager Direct Dial: [phone]  Fax: [phone]

## 2024-05-19 NOTE — Patient Outreach (Signed)
 Complex Care Management   Visit Note  05/19/2024  Name:  [name] MRN: [id number] DOB: December 23, 1975  Situation: Referral received for Complex Care Management related to COPD I obtained verbal consent from Patient.  Visit completed with Patient  on the phone  Background:   Past Medical History:  Diagnosis Date   Anxiety    Phreesia 12/30/2019   Asthma    COPD (chronic obstructive pulmonary disease) (HCC)    Depression    Dyspnea    GERD (gastroesophageal reflux disease)    Hidradenitis suppurativa    History of hiatal hernia    Hyperlipidemia    Hypertension    IBS (irritable bowel syndrome)    Intervertebral disc disorder with myelopathy, cervical region    OAB (overactive bladder)    Tobacco use    Vitamin B 12 deficiency    Vitamin D  deficiency     Assessment: Other issues discussed today relate to right knee/calf issues followed by a recent scan indicating poor perfusion. No other interventions mentioned at this time. Educated patient on AD and will mail pack for her to review (decline assistance at this time to complete). RNCM will also include educational information on COPD/emphysema and request [employer] to send food resources. Patient Reported Symptoms:  Cognitive Cognitive Status: Able to follow simple commands, Alert and oriented to person, place, and time, Normal speech and language skills Cognitive/Intellectual Conditions Management [RPT]: None reported or documented in medical history or problem list   Health Maintenance Behaviors: Annual physical exam Healing Pattern: Slow Health Facilitated by: Rest  Neurological Neurological Review of Symptoms: Dizziness (Reports motion sickness and treats with OTC medication with good results) Neurological Management Strategies: Coping strategies, Medication therapy  HEENT HEENT Symptoms Reported: No symptoms reported HEENT Self-Management Outcome: 4 (good)    Cardiovascular Cardiovascular Symptoms Reported:  Lightheadness, Dizziness (Motion sickness resolved with OTC medication) Does patient have uncontrolled Hypertension?: No (Home devices indicated 127/80 reported today from patient) Cardiovascular Management Strategies: Coping strategies, Medication therapy, Routine screening Weight: 235 lb (106.6 kg) Cardiovascular Self-Management Outcome: 4 (good)  Respiratory Respiratory Symptoms Reported: Shortness of breath, Dry cough (Patient adherent with prescribed inhalers Symbicort , Spiriva  and Ventolin ) Other Respiratory Symptoms: Pt reports recent CT scan- findings of pulmonoloy nodes providers will continue to monitor in 6 months (Stage 1 mild COPD). Additional Respiratory Details: Pt continue to smoke 1 1/2 packs a day (declined smoking cessation at this time). Pending sleep study on 06/26/2024 for further interventions and will f/u with pulmonologist in Jan 2026 Respiratory Management Strategies: Adequate rest, Routine screening, Medication therapy  Endocrine Endocrine Symptoms Reported: No symptoms reported Is patient diabetic?: No Endocrine Self-Management Outcome: 4 (good)  Gastrointestinal Gastrointestinal Symptoms Reported: No symptoms reported Gastrointestinal Management Strategies: Medication therapy Gastrointestinal Self-Management Outcome: 4 (good)    Genitourinary Genitourinary Symptoms Reported: Incontinence Genitourinary Management Strategies: Incontinence garment/pad Genitourinary Self-Management Outcome: 4 (good)  Integumentary Integumentary Symptoms Reported: No symptoms reported Skin Management Strategies: Coping strategies, Routine screening Skin Self-Management Outcome: 4 (good)  Musculoskeletal Musculoskelatal Symptoms Reviewed: Muscle pain Additional Musculoskeletal Details: Reports related to leg pain and neck spasms. Musculoskeletal Management Strategies: Medication therapy Musculoskeletal Self-Management Outcome: 4 (good) Falls in the past year?: Yes Number of falls in  past year: 1 or less Was there an injury with Fall?: No Fall Risk Category Calculator: 1 Patient Fall Risk Level: Low Fall Risk Patient at Risk for Falls Due to: Impaired balance/gait Fall risk Follow up: Education provided  Psychosocial Psychosocial Symptoms Reported: Depression - if  selected complete PHQ 2-9     Quality of Family Relationships: helpful, involved, supportive Do you feel physically threatened by others?: No    05/19/2024    PHQ2-9 Depression Screening   Little interest or pleasure in doing things Several days  Feeling down, depressed, or hopeless More than half the days  PHQ-2 - Total Score 3  Trouble falling or staying asleep, or sleeping too much Not at all  Feeling tired or having little energy More than half the days  Poor appetite or overeating  Nearly every day  Feeling bad about yourself - or that you are a failure or have let yourself or your family down Nearly every day  Trouble concentrating on things, such as reading the newspaper or watching television Several days  Moving or speaking so slowly that other people could have noticed.  Or the opposite - being so fidgety or restless that you have been moving around a lot more than usual Nearly every day  Thoughts that you would be better off dead, or hurting yourself in some way Not at all  PHQ2-9 Total Score 15  If you checked off any problems, how difficult have these problems made it for you to do your work, take care of things at home, or get along with other people Somewhat difficult  Depression Interventions/Treatment Medication (Declined LCSW via [facility] for any counseling at this time. Pt aware to address further with her provider when she wants further intervention)    Vitals:   05/19/24 0938  BP: 127/80    Medications Reviewed Today     Reviewed by Alvia Olam BIRCH, RN (Registered Nurse) on 05/19/24 at [phone]  Med List Status: <None>   Medication Order Taking? Sig Documenting Provider Last Dose  Status Informant  Cholecalciferol  (VITAMIN D ) 125 MCG (5000 UT) CAPS [id number] Yes Take 5,000 Units by mouth daily. [provider]  Active Self  cyclobenzaprine  (FLEXERIL ) 10 MG tablet [id number] Yes Take 1 tablet by mouth three times daily as needed for muscle spasm Gregory Edsel Ruth, PA  Active   escitalopram  (LEXAPRO ) 10 MG tablet [id number] Yes Take 1 tablet by mouth once daily Del Orbe Polanco, Iliana, FNP  Active   furosemide  (LASIX ) 20 MG tablet [id number] Yes Take 1 tablet (20 mg total) by mouth as needed for up to 1 dose for edema or fluid. For Leg swelling Del Orbe Polanco, [place], [place]  Active   gabapentin  (NEURONTIN ) 300 MG capsule [id number] Yes TAKE 2 CAPSULES BY MOUTH THREE TIMES DAILY Ulis Bottcher, PA-C  Active   linaclotide  (LINZESS ) 145 MCG CAPS capsule [id number] Yes Take 1 capsule (145 mcg total) by mouth daily. Carlan, Chelsea L, NP  Active Self  metoprolol  tartrate (LOPRESSOR ) 50 MG tablet [id number] Yes Take 1 tablet by mouth twice daily Del Orbe Polanco, Iliana, FNP  Active   olmesartan  (BENICAR ) 20 MG tablet [id number] Yes Take 0.5 tablets (10 mg total) by mouth daily. Del Wilhelmena Falter, Hilario, FNP  Active Self  Omega-3 Fatty Acids (FISH OIL ) 1000 MG CAPS [id number] Yes Take 2 capsules (2,000 mg total) by mouth in the morning and at bedtime. Del Wilhelmena Falter, Hilario, FNP  Active Self  omeprazole  (PRILOSEC) 40 MG capsule [id number] Yes Take 1 capsule (40 mg total) by mouth daily. Carlan, Chelsea L, NP  Active Self  ondansetron  (ZOFRAN ) 4 MG tablet [id number] Yes TAKE 1 TABLET BY MOUTH EVERY 8 HOURS AS NEEDED FOR NAUSEA OR VOMITING Carlan, Chelsea L, NP  Active Self  oxybutynin  (  DITROPAN -XL) 10 MG 24 hr tablet [id number] Yes Take 10 mg by mouth at bedtime. [provider]  Active Self  polyethylene glycol powder (GLYCOLAX /MIRALAX ) 17 GM/SCOOP powder [id number] Yes Take 17 g by mouth daily as needed for moderate constipation. Dissolve 1 capful (17g) in 4-8 ounces of  liquid and take by mouth daily. Gregory Edsel Ruth, PA  Active   rosuvastatin  (CRESTOR ) 5 MG tablet [id number] Yes Take 1 tablet by mouth once daily Del Orbe Polanco, [place], FNP  Active   senna (SENOKOT) 8.6 MG TABS tablet [id number] Yes Take 1 tablet (8.6 mg total) by mouth 2 (two) times daily as needed for mild constipation. Gregory Edsel Ruth, PA  Active   SYMBICORT  160-4.5 MCG/ACT inhaler [id number] Yes Inhale 2 puffs into the lungs 2 (two) times daily. Tamea Dedra CROME, MD  Active Self  Tiotropium Bromide Monohydrate  (SPIRIVA  RESPIMAT) 2.5 MCG/ACT AERS [id number] Yes Inhale 2 puffs into the lungs daily. Tamea Dedra CROME, MD  Active Self  VENTOLIN  HFA 108 [phone] Base) MCG/ACT inhaler [id number] Yes INHALE 2 PUFFS BY MOUTH EVERY 6 HOURS AS NEEDED FOR WHEEZING OR SHORTNESS OF BREATH Del Orbe Polanco, [place], FNP  Active   vitamin B-12 (CYANOCOBALAMIN ) 1000 MCG tablet [id number] Yes Take 1 tablet (1,000 mcg total) by mouth daily. Moishe Chiquita HERO, NP  Active Self  Med List Note (Ashe, Dana K, CPhT 06/30/18 1857): Some medications through [facility] [facility]            Recommendation:   PCP Follow-up Continue Current Plan of Care  Follow Up Plan:   Telephone follow up appointment date/time:  06/02/2024 @ 1:00 PM   Olam Ku, RN, BSN [place]  [facility], [facility] RN Care Manager Direct Dial: [phone]  Fax: [phone]

## 2024-05-20 ENCOUNTER — Telehealth: Payer: Self-pay | Admitting: *Deleted

## 2024-05-20 NOTE — Progress Notes (Signed)
 Complex Care Management Note Care Guide Note  05/20/2024 Name: [name] MRN: [id number] DOB: 09-Mar-1976  [name] is a 48 y.o. year old female who is a primary care patient of Del Wilhelmena Lloyd Sola, FNP . The community resource team was consulted for assistance with Transportation Needs  and Food Insecurity  SDOH screenings and interventions completed:  Yes  Social Drivers of Health From This Encounter   Food Insecurity: Unknown (05/20/2024)   Hunger Vital Sign    Worried About Running Out of Food in the Last Year: Never true  Financial Resource Strain: Medium Risk (05/20/2024)   Overall Financial Resource Strain (CARDIA)    Difficulty of Paying Living Expenses: Somewhat hard    SDOH Interventions Today    Flowsheet Row Most Recent Value  SDOH Interventions   Food Insecurity Interventions --  [emailing information]  Financial Strain Interventions Community Resources Provided  [provided iinformation on transportation options]     Care guide performed the following interventions: Patient provided with information about care guide support team and interviewed to confirm resource needs.  Follow Up Plan:  No further follow up planned at this time. The patient has been provided with needed resources.  Encounter Outcome:  Patient Visit Completed  Evonne Rinks Greenauer-Moran  [facility] HealthPopulation Health Care Guide  Direct Dial:[phone] [phone] Website: [email or web address]

## 2024-05-23 ENCOUNTER — Ambulatory Visit: Admitting: Neurosurgery

## 2024-05-23 ENCOUNTER — Encounter: Payer: Self-pay | Admitting: Family Medicine

## 2024-05-23 ENCOUNTER — Other Ambulatory Visit: Payer: Self-pay

## 2024-05-23 VITALS — BP 124/82 | Temp 98.2°F

## 2024-05-23 DIAGNOSIS — M545 Low back pain, unspecified: Secondary | ICD-10-CM | POA: Insufficient documentation

## 2024-05-23 DIAGNOSIS — Z4889 Encounter for other specified surgical aftercare: Secondary | ICD-10-CM

## 2024-05-23 DIAGNOSIS — F321 Major depressive disorder, single episode, moderate: Secondary | ICD-10-CM

## 2024-05-23 DIAGNOSIS — G5601 Carpal tunnel syndrome, right upper limb: Secondary | ICD-10-CM

## 2024-05-23 DIAGNOSIS — Z9889 Other specified postprocedural states: Secondary | ICD-10-CM | POA: Insufficient documentation

## 2024-05-23 NOTE — Progress Notes (Signed)
   REFERRING PHYSICIAN:  Del Wilhelmena Lloyd Sola, Fnp [phone] [address] [place],  [place] [address]  DOS: 04/12/24, R US  guided CTR  HISTORY OF PRESENT ILLNESS: [name] is 6 weeks status post right ultrasound-guided carpal tunnel release. Overall, she is doing very well.  The majority of the tingling she was experiencing in her hand has stopped.  She continues to have some tingling in the tips of her fingers.  She feels as though her strength has started to improve and her pain has decreased.  She does state that her back pain has gotten considerably worse.  She recently finished her physical therapy for her cervical spine but continues to have significant back pain.  This radiates from her back down her bilateral lower extremities.  It is caused her to have significant decrease in her quality of life and ability to perform her ADLs.  She is currently having some other medical issues for which she is actively attending.  PHYSICAL EXAMINATION:  NEUROLOGICAL:  General: In no acute distress.   Awake, alert, oriented to person, place, and time.  Pupils equal round and reactive to light.  Facial tone is symmetric.    Strength:Right hand strength is to baseline  Incision c/d/I.    Imaging:  No new imaging prior to his appointment  Assessment / Plan: [name] is doing well after right ultrasound-guided carpal tunnel release on 04/12/2024.  At this point she is doing very well after carpal tunnel release.  She does have as expected some residual tingling in the tips of her fingers bilaterally likely from her cervical spinal issues and double crush type phenomenon.  She also asked that we evaluate her for her back pain which is unrelated to this visit.  She does have a history of cervical spine cervical arthroplasty for which she has had an improvement in her upper extremities but feels that her back pain has continued to worsen.  She has not yet done physical therapy for her  back.  She feels like is mostly in her mid to low back and can sometimes radiate down her legs.  She has not had any specific exercises.  Not had any recent injections.  On physical examination she has good strength in her lower extremities.  She does have difficulty when she is standing for prolonged periods when she is standing for 2 hours or other activities.  Will plan to have her start some physical therapy.  I did review her recent lumbar x-rays of flexion-extension which did not show any major instability.  If her back continues to worsen or she does not have improvement with physical therapy we will plan for imaging and likely referral to pain medicine.   Advised to contact the office if any questions or concerns arise.   Penne LELON Sharps MD Dept of Neurosurgery

## 2024-05-23 NOTE — Telephone Encounter (Signed)
 yes

## 2024-05-27 ENCOUNTER — Ambulatory Visit
Admission: RE | Admit: 2024-05-27 | Discharge: 2024-05-27 | Disposition: A | Discharge status: Home / Self Care | Source: Ambulatory Visit | Attending: Pulmonary Disease | Admitting: Pulmonary Disease

## 2024-05-27 DIAGNOSIS — I10 Essential (primary) hypertension: Secondary | ICD-10-CM | POA: Insufficient documentation

## 2024-05-27 DIAGNOSIS — R0609 Other forms of dyspnea: Secondary | ICD-10-CM | POA: Diagnosis not present

## 2024-05-27 DIAGNOSIS — I3139 Other pericardial effusion (noninflammatory): Secondary | ICD-10-CM | POA: Diagnosis not present

## 2024-05-27 DIAGNOSIS — F419 Anxiety disorder, unspecified: Secondary | ICD-10-CM | POA: Diagnosis not present

## 2024-05-27 DIAGNOSIS — J449 Chronic obstructive pulmonary disease, unspecified: Secondary | ICD-10-CM | POA: Diagnosis not present

## 2024-05-27 DIAGNOSIS — R0600 Dyspnea, unspecified: Secondary | ICD-10-CM | POA: Insufficient documentation

## 2024-05-27 LAB — ECHOCARDIOGRAM COMPLETE
AR max vel: 3.68 cm2
AV Area VTI: 4.37 cm2
AV Area mean vel: 3.76 cm2
AV Mean grad: 2 mmHg
AV Peak grad: 4 mmHg
Ao pk vel: 1 m/s
Area-P 1/2: 4.06 cm2
MV VTI: 3.78 cm2

## 2024-05-27 NOTE — Progress Notes (Signed)
*  PRELIMINARY RESULTS* Echocardiogram 2D Echocardiogram has been performed.  Floydene Harder 05/27/2024, 11:36 AM

## 2024-05-30 ENCOUNTER — Ambulatory Visit (INDEPENDENT_AMBULATORY_CARE_PROVIDER_SITE_OTHER): Payer: Self-pay | Admitting: Pulmonary Disease

## 2024-06-02 ENCOUNTER — Other Ambulatory Visit: Payer: Self-pay | Admitting: *Deleted

## 2024-06-02 NOTE — Patient Instructions (Signed)
 Visit Information  [name] was given information about Medicaid Managed Care team care coordination services as a part of their Healthy [facility] Medicaid benefit. [name]   If you would like to schedule transportation through your Healthy [facility] plan, please call the following number at least 2 days in advance of your appointment: [phone]  For information about your ride after you set it up, call Ride Assist at [phone]. Use this number to activate a Will Call pickup, or if your transportation is late for a scheduled pickup. Use this number, too, if you need to make a change or cancel a previously scheduled reservation.  If you need transportation services right away, call [phone]. The after-hours call center is staffed 24 hours to handle ride assistance and urgent reservation requests (including discharges) 365 days a year. Urgent trips include sick visits, hospital discharge requests and life-sustaining treatment.  Call the [facility] Line at [phone], at any time, 24 hours a day, 7 days a week. If you are in danger or need immediate medical attention call 911.   Please see education materials related to smoking cessation provided by MyChart link.  Care plan and visit instructions communicated with the patient verbally today. Patient agrees to receive a copy in MyChart. Active MyChart status and patient understanding of how to access instructions and care plan via MyChart confirmed with patient.     Telephone follow up appointment with Managed Medicaid care management team member scheduled for:07/01/2024 @ 1:00 pm   Olam Ku, RN, BSN [place]  [facility], [facility] RN Care Manager Direct Dial: [phone]  Fax: [phone]

## 2024-06-02 NOTE — Patient Outreach (Signed)
 Complex Care Management   Visit Note  06/02/2024  Name:  [name] MRN: [id number] DOB: [birth year]  Situation: Referral received for Complex Care Management related to COPD I obtained verbal consent from Patient.  Visit completed with Patient  on the phone  Background:   Past Medical History:  Diagnosis Date   Anxiety    Phreesia 12/30/2019   Asthma    COPD (chronic obstructive pulmonary disease) (HCC)    Depression    Dyspnea    GERD (gastroesophageal reflux disease)    Hidradenitis suppurativa    History of hiatal hernia    Hyperlipidemia    Hypertension    IBS (irritable bowel syndrome)    Intervertebral disc disorder with myelopathy, cervical region    OAB (overactive bladder)    Tobacco use    Vitamin B 12 deficiency    Vitamin D  deficiency     Assessment: Pt has weaned down to 1 pk a day smoking. RNCM praised pt for her efforts and continued to offer smoking cessation programs (declined). Will continue to encouraged adherence to the plan of care and reiterated on the risk with smoking for her ongoing health. Patient Reported Symptoms:  Cognitive Cognitive Status: Able to follow simple commands, Alert and oriented to person, place, and time, Normal speech and language skills   Health Maintenance Behaviors: Annual physical exam Healing Pattern: Slow  Neurological Neurological Review of Symptoms: Dizziness (Reports motion sickness and treats with OTC medication with good results) Neurological Management Strategies: Coping strategies, Medication therapy  HEENT HEENT Symptoms Reported: No symptoms reported HEENT Self-Management Outcome: 4 (good)    Cardiovascular Cardiovascular Symptoms Reported: Dizziness (Reports motion sickness and treats with OTC medication with good results) Does patient have uncontrolled Hypertension?: No (125/79 n home device today) Cardiovascular Management Strategies: Coping strategies, Medication therapy, Routine screening Weight: 235  lb (106.6 kg) Cardiovascular Self-Management Outcome: 4 (good)  Respiratory Respiratory Symptoms Reported: Dry cough, Shortness of breath (Patient adherent with prescribed inhalers Symbicort , Spiriva  and Ventolin ) Additional Respiratory Details: Pt continue to smoke one pack a day as she has reduced her smoking (continue to decline smoking cessation program at this time). Continue to report pending sleep study in 06/26/2024 and pulmonologist 07/15/2024 Respiratory Management Strategies: Adequate rest, Medication therapy, Routine screening  Endocrine Endocrine Symptoms Reported: No symptoms reported Is patient diabetic?: No Endocrine Self-Management Outcome: 4 (good)  Gastrointestinal Gastrointestinal Symptoms Reported: No symptoms reported Gastrointestinal Management Strategies: Medication therapy    Genitourinary Genitourinary Symptoms Reported: Incontinence Genitourinary Management Strategies: Incontinence garment/pad Genitourinary Self-Management Outcome: 4 (good)  Integumentary Integumentary Symptoms Reported: No symptoms reported Additional Integumentary Details: Dry skin utilizes OTC lotions-Educated on fragnant free products Skin Management Strategies: Coping strategies, Routine screening Skin Self-Management Outcome: 4 (good)  Musculoskeletal Musculoskelatal Symptoms Reviewed: Muscle pain Additional Musculoskeletal Details: Bilateral hands-takes muscle relaxant to help with pain. Musculoskeletal Management Strategies: Medication therapy, Routine screening Musculoskeletal Self-Management Outcome: 4 (good)      Psychosocial Psychosocial Symptoms Reported: No symptoms reported Additional Psychological Details: Receiveda referral for a psychiatrist currently awaiting a call to schedule an appointment. Denies any immediate needs for counseling at this time. Behavioral Management Strategies: Medication therapy, Coping strategies, Support system Major Change/Loss/Stressor/Fears (CP):  Denies Techniques to Cope with Loss/Stress/Change: Diversional activities (Reports she is reading alot of books at this time.) Quality of Family Relationships: helpful, involved, supportive Do you feel physically threatened by others?: No    Today's Vitals   06/02/24 1321  Weight: 235 lb (106.6 kg)  Pain Scale: 0-10 Pain Score: 7  Pain Type: Chronic pain Pain Location: Hand Pain Orientation: Left (Mostly left both occurs bilaterally) Pain Descriptors / Indicators: Numbness, Tingling Pain Onset: On-going Patients Stated Pain Goal: 0 Pain Intervention(s): Medication (See eMAR), Repositioned, Massage  Medications Reviewed Today     Reviewed by Alvia Olam BIRCH, RN (Registered Nurse) on 06/02/24 at 1317  Med List Status: <None>   Medication Order Taking? Sig Documenting Provider Last Dose Status Informant  Cholecalciferol  (VITAMIN D ) 125 MCG (5000 UT) CAPS [id number] Yes Take 5,000 Units by mouth daily. [provider]  Active Self  cyclobenzaprine  (FLEXERIL ) 10 MG tablet [id number] Yes Take 1 tablet by mouth three times daily as needed for muscle spasm Gregory Edsel Ruth, PA  Active   escitalopram  (LEXAPRO ) 10 MG tablet [id number] Yes Take 1 tablet by mouth once daily Del Orbe Polanco, [place], FNP  Active   furosemide  (LASIX ) 20 MG tablet [id number] Yes Take 1 tablet (20 mg total) by mouth as needed for up to 1 dose for edema or fluid. For Leg swelling Del Wilhelmena Falter, [place], [place]  Active   gabapentin  (NEURONTIN ) 300 MG capsule [id number] Yes TAKE 2 CAPSULES BY MOUTH THREE TIMES DAILY Ulis Bottcher, PA-C  Active   linaclotide  (LINZESS ) 145 MCG CAPS capsule [id number] Yes Take 1 capsule (145 mcg total) by mouth daily. Carlan, Chelsea L, NP  Active Self  metoprolol  tartrate (LOPRESSOR ) 50 MG tablet [id number] Yes Take 1 tablet by mouth twice daily Del Orbe Polanco, [place], FNP  Active   olmesartan  (BENICAR ) 20 MG tablet [id number] Yes Take 0.5 tablets (10 mg total) by mouth daily. Del  Wilhelmena Falter, Hilario, FNP  Active Self  Omega-3 Fatty Acids (FISH OIL ) 1000 MG CAPS [id number] Yes Take 2 capsules (2,000 mg total) by mouth in the morning and at bedtime. Del Wilhelmena Falter, Hilario, FNP  Active Self  omeprazole  (PRILOSEC) 40 MG capsule [id number] Yes Take 1 capsule (40 mg total) by mouth daily. Carlan, Chelsea L, NP  Active Self  ondansetron  (ZOFRAN ) 4 MG tablet [id number] Yes TAKE 1 TABLET BY MOUTH EVERY 8 HOURS AS NEEDED FOR NAUSEA OR VOMITING Carlan, Chelsea L, NP  Active Self  oxybutynin  (DITROPAN -XL) 10 MG 24 hr tablet [id number] Yes Take 10 mg by mouth at bedtime. [provider]  Active Self  polyethylene glycol powder (GLYCOLAX /MIRALAX ) 17 GM/SCOOP powder [id number] Yes Take 17 g by mouth daily as needed for moderate constipation. Dissolve 1 capful (17g) in 4-8 ounces of liquid and take by mouth daily. Gregory Edsel Ruth, PA  Active   rosuvastatin  (CRESTOR ) 5 MG tablet [id number] Yes Take 1 tablet by mouth once daily Del Orbe Polanco, [place], FNP  Active   senna (SENOKOT) 8.6 MG TABS tablet [id number] Yes Take 1 tablet (8.6 mg total) by mouth 2 (two) times daily as needed for mild constipation. Gregory Edsel Ruth, PA  Active   SYMBICORT  160-4.5 MCG/ACT inhaler [id number] Yes Inhale 2 puffs into the lungs 2 (two) times daily. Tamea Dedra CROME, MD  Active Self  Tiotropium Bromide Monohydrate  (SPIRIVA  RESPIMAT) 2.5 MCG/ACT AERS [id number] Yes Inhale 2 puffs into the lungs daily. Tamea Dedra CROME, MD  Active Self  VENTOLIN  HFA 108 [phone] Base) MCG/ACT inhaler [id number] Yes INHALE 2 PUFFS BY MOUTH EVERY 6 HOURS AS NEEDED FOR WHEEZING OR SHORTNESS OF BREATH Del Orbe Polanco, [place], FNP  Active   vitamin B-12 (CYANOCOBALAMIN ) 1000 MCG tablet [id number] Yes Take 1 tablet (1,000 mcg total) by mouth daily. Moishe Chiquita HERO,  NP  Active Self  Med List Note (Ashe, Dana K, CPhT 06/30/18 1857): Some medications through [facility] [facility]            Recommendation:   PCP  Follow-up Continue Current Plan of Care  Follow Up Plan:   Telephone follow up appointment date/time:  07/01/2024 @ 1:00 pm   Olam Ku, RN, BSN [place]  [facility], [facility] RN Care Manager Direct Dial: [phone]  Fax: [phone]

## 2024-06-08 ENCOUNTER — Other Ambulatory Visit: Payer: Self-pay | Admitting: Physician Assistant

## 2024-06-11 ENCOUNTER — Other Ambulatory Visit: Payer: Self-pay | Admitting: Family Medicine

## 2024-06-11 DIAGNOSIS — I10 Essential (primary) hypertension: Secondary | ICD-10-CM

## 2024-06-18 ENCOUNTER — Other Ambulatory Visit: Payer: Self-pay | Admitting: Neurosurgery

## 2024-06-19 ENCOUNTER — Other Ambulatory Visit: Payer: Self-pay | Admitting: Family Medicine

## 2024-06-19 DIAGNOSIS — F321 Major depressive disorder, single episode, moderate: Secondary | ICD-10-CM

## 2024-06-22 ENCOUNTER — Ambulatory Visit: Attending: Sleep Medicine

## 2024-06-22 DIAGNOSIS — R0683 Snoring: Secondary | ICD-10-CM | POA: Diagnosis not present

## 2024-06-22 DIAGNOSIS — G4733 Obstructive sleep apnea (adult) (pediatric): Secondary | ICD-10-CM | POA: Diagnosis not present

## 2024-06-29 DIAGNOSIS — G4733 Obstructive sleep apnea (adult) (pediatric): Secondary | ICD-10-CM

## 2024-07-01 ENCOUNTER — Telehealth: Admitting: *Deleted

## 2024-07-01 NOTE — Patient Instructions (Signed)
 [name] - I am sorry I was unable to reach you today for our scheduled appointment. I work with Del Wilhelmena Lloyd Sola, FNP and am calling to support your healthcare needs. Please contact me at [phone] at your earliest convenience. I look forward to speaking with you soon.   Thank you,   Olam Ku, RN, BSN   [facility], [facility] RN Care Manager Direct Dial: [phone]  Fax: [phone]

## 2024-07-05 ENCOUNTER — Other Ambulatory Visit: Payer: Self-pay | Admitting: Neurosurgery

## 2024-07-08 ENCOUNTER — Other Ambulatory Visit: Payer: Self-pay | Admitting: *Deleted

## 2024-07-08 NOTE — Patient Outreach (Addendum)
 Complex Care Management   Visit Note  07/08/2024  Name:  [name] MRN: [id number] DOB: 06-Jul-1976  Situation: Referral received for Complex Care Management related to COPD I obtained verbal consent from Patient.  Visit completed with Patient  on the phone  Background:   Past Medical History:  Diagnosis Date   Anxiety    Phreesia 12/30/2019   Asthma    COPD (chronic obstructive pulmonary disease) (HCC)    Depression    Dyspnea    GERD (gastroesophageal reflux disease)    Hidradenitis suppurativa    History of hiatal hernia    Hyperlipidemia    Hypertension    IBS (irritable bowel syndrome)    Intervertebral disc disorder with myelopathy, cervical region    OAB (overactive bladder)    Tobacco use    Vitamin B 12 deficiency    Vitamin D  deficiency     Assessment: Patient Reported Symptoms:  Cognitive Cognitive Status: Able to follow simple commands, Alert and oriented to person, place, and time, Normal speech and language skills   Health Maintenance Behaviors: Annual physical exam Healing Pattern: Slow Health Facilitated by: Rest  Neurological Neurological Review of Symptoms: Dizziness (Continues to reports motion sickness and treats with OTC medication with good results. Pt aware to contact her provider if symptoms worsen) Neurological Management Strategies: Coping strategies Neurological Self-Management Outcome: 4 (good)  HEENT HEENT Symptoms Reported: No symptoms reported HEENT Management Strategies: Coping strategies HEENT Self-Management Outcome: 4 (good)    Cardiovascular Cardiovascular Symptoms Reported: Dizziness (Reports motion sickness and treats with OTC medication with good results) Does patient have uncontrolled Hypertension?: Yes Is patient checking Blood Pressure at home?: Yes Patient's Recent BP reading at home: Pt reports home device on morning read was 128/71 HR 92 and continue to stay in this range with reported symptoms. Cardiovascular  Management Strategies: Coping strategies, Routine screening, Medication therapy Weight: 235 lb (106.6 kg) Cardiovascular Self-Management Outcome: 4 (good)  Respiratory Respiratory Symptoms Reported: Shortness of breath (Patient adherent with prescribed inhalers Symbicort , Spiriva  and Ventolin ) Other Respiratory Symptoms: Pt reports recent CT scan- findings of pulmonoloy nodes providers will continue to monitor in 6 months (Stage 1 mild COPD). Continue to report pending sleep study in 06/26/2024 and pulmonologist 07/15/2024 Additional Respiratory Details: Pt continue to smoke one pack a day as she has reduced her smoking (continue to decline smoking cessation program at this time). Pt completed her sleep study in 06/26/2024 and just obtained her CPAP to use  nightly will start using tonight Respiratory Management Strategies: Adequate rest, Coping strategies, Routine screening Respiratory Self-Management Outcome: 4 (good)  Endocrine Endocrine Symptoms Reported: No symptoms reported Is patient diabetic?: No Endocrine Self-Management Outcome: 4 (good)  Gastrointestinal Gastrointestinal Symptoms Reported: No symptoms reported Gastrointestinal Management Strategies: Coping strategies, Medication therapy Gastrointestinal Self-Management Outcome: 4 (good)    Genitourinary Genitourinary Symptoms Reported: Incontinence Genitourinary Management Strategies: Incontinence garment/pad Genitourinary Self-Management Outcome: 4 (good)  Integumentary Integumentary Symptoms Reported: No symptoms reported Additional Integumentary Details: Pt utilizing OTC cream that has helped alot with her ongoing dry skin. Pt aware to use the fragnant free products. Skin Management Strategies: Coping strategies, Routine screening Skin Self-Management Outcome: 4 (good)  Musculoskeletal Musculoskelatal Symptoms Reviewed: Back pain (Pt will start therapy for her ongoing back in January 2026) Additional Musculoskeletal Details: RNCM  also encouraged pt to use her prescribed medication to assist with her ongoing muscle aches and pain Musculoskeletal Management Strategies: Medication therapy, Coping strategies, Routine screening Musculoskeletal Self-Management Outcome: 4 (good)  Psychosocial Psychosocial Symptoms Reported: No symptoms reported Additional Psychological Details: Pt will start counseling with her psychiatrist in May for the next appointment however she is on the waiting list for first available. Denies any immediate needs for counseling at this time. Pt states she is alright right now. Behavioral Management Strategies: Coping strategies, Medication therapy Behavioral Health Self-Management Outcome: 4 (good) Major Change/Loss/Stressor/Fears (CP): Denies Techniques to Cope with Loss/Stress/Change: Diversional activities (Reports she is reading alot of books at this time.) Quality of Family Relationships: helpful, involved, supportive Do you feel physically threatened by others?: No    Today's Vitals   07/08/24 1324  BP: 128/71  Pulse: 92  Weight: 235 lb (106.6 kg)   Pain Scale: 0-10 Pain Score: 6  Pain Type: Chronic pain Pain Location: Hand (tips of finger extending to her hand) Pain Orientation: Left Pain Descriptors / Indicators: Tingling, Numbness Pain Onset: On-going Patients Stated Pain Goal: 0 Pain Intervention(s): Medication (See eMAR), Rest, Massage, Repositioned, Hot/Cold interventions  Medications Reviewed Today     Reviewed by Alvia Olam BIRCH, RN (Registered Nurse) on 07/08/24 at 1319  Med List Status: <None>   Medication Order Taking? Sig Documenting Provider Last Dose Status Informant  Cholecalciferol  (VITAMIN D ) 125 MCG (5000 UT) CAPS [id number] Yes Take 5,000 Units by mouth daily. [provider]  Active Self  cyclobenzaprine  (FLEXERIL ) 10 MG tablet [id number] Yes Take 1 tablet by mouth three times daily as needed for muscle spasm Gregory Edsel Ruth, PA  Active    escitalopram  (LEXAPRO ) 10 MG tablet [id number] Yes Take 1 tablet by mouth once daily Del Orbe Polanco, Iliana, FNP  Active   furosemide  (LASIX ) 20 MG tablet [id number] Yes Take 1 tablet (20 mg total) by mouth as needed for up to 1 dose for edema or fluid. For Leg swelling Del Orbe Polanco, [place], [place]  Active   gabapentin  (NEURONTIN ) 300 MG capsule [id number] Yes TAKE 2 CAPSULES BY MOUTH THREE TIMES DAILY Gregory Edsel Ruth, PA  Active   linaclotide  (LINZESS ) 145 MCG CAPS capsule [id number] Yes Take 1 capsule (145 mcg total) by mouth daily. Carlan, Chelsea L, NP  Active Self  metoprolol  tartrate (LOPRESSOR ) 50 MG tablet [id number] Yes Take 1 tablet by mouth twice daily Del Orbe Polanco, Iliana, FNP  Active   olmesartan  (BENICAR ) 20 MG tablet [id number] Yes Take 0.5 tablets (10 mg total) by mouth daily. Del Wilhelmena Falter, Hilario, FNP  Active Self  Omega-3 Fatty Acids (FISH OIL ) 1000 MG CAPS [id number] Yes Take 2 capsules (2,000 mg total) by mouth in the morning and at bedtime. Del Wilhelmena Falter, Hilario, FNP  Active Self  omeprazole  (PRILOSEC) 40 MG capsule [id number] Yes Take 1 capsule (40 mg total) by mouth daily. Carlan, Chelsea L, NP  Active Self  ondansetron  (ZOFRAN ) 4 MG tablet [id number] Yes TAKE 1 TABLET BY MOUTH EVERY 8 HOURS AS NEEDED FOR NAUSEA OR VOMITING Carlan, Chelsea L, NP  Active Self  oxybutynin  (DITROPAN -XL) 10 MG 24 hr tablet [id number] Yes Take 10 mg by mouth at bedtime. [provider]  Active Self  polyethylene glycol powder (GLYCOLAX /MIRALAX ) 17 GM/SCOOP powder [id number] Yes Take 17 g by mouth daily as needed for moderate constipation. Dissolve 1 capful (17g) in 4-8 ounces of liquid and take by mouth daily. Gregory Edsel Ruth, PA  Active   rosuvastatin  (CRESTOR ) 5 MG tablet [id number] Yes Take 1 tablet by mouth once daily Del Orbe Polanco, Iliana, FNP  Active   senna (SENOKOT) 8.6 MG TABS tablet [id number] Yes Take  1 tablet (8.6 mg total) by mouth 2 (two) times daily as needed for mild  constipation. Gregory Edsel Ruth, [place]  Active   SYMBICORT  160-4.5 MCG/ACT inhaler [id number] Yes Inhale 2 puffs into the lungs 2 (two) times daily. Tamea Dedra CROME, MD  Active Self  Tiotropium Bromide Monohydrate  (SPIRIVA  RESPIMAT) 2.5 MCG/ACT AERS [id number] Yes Inhale 2 puffs into the lungs daily. Tamea Dedra CROME, MD  Active Self  VENTOLIN  HFA 108 [phone] Base) MCG/ACT inhaler [id number] Yes INHALE 2 PUFFS BY MOUTH EVERY 6 HOURS AS NEEDED FOR WHEEZING OR SHORTNESS OF BREATH Del Orbe Polanco, [place], FNP  Active   vitamin B-12 (CYANOCOBALAMIN ) 1000 MCG tablet [id number] Yes Take 1 tablet (1,000 mcg total) by mouth daily. Moishe Chiquita HERO, NP  Active Self  Med List Note (Ashe, Dana K, CPhT 06/30/18 1857): Some medications through [facility]            Recommendation:   PCP Follow-up Continue Current Plan of Care  Follow Up Plan:   Telephone follow up appointment date/time:  08/05/2024 @ 1:00 pm with Rosaline Finlay, RNCM   Olam Ku, RN, BSN [place]  [facility], [facility] RN Care Manager Direct Dial: [phone]  Fax: [phone]

## 2024-07-08 NOTE — Patient Instructions (Signed)
 Visit Information  Ms. Razzano was given information about Medicaid Managed Care team care coordination services as a part of their Healthy [facility] Medicaid benefit. Tracyann B Pyon   If you would like to schedule transportation through your Healthy [facility] plan, please call the following number at least 2 days in advance of your appointment: [phone]  For information about your ride after you set it up, call Ride Assist at [phone]. Use this number to activate a Will Call pickup, or if your transportation is late for a scheduled pickup. Use this number, too, if you need to make a change or cancel a previously scheduled reservation.  If you need transportation services right away, call [phone]. The after-hours call center is staffed 24 hours to handle ride assistance and urgent reservation requests (including discharges) 365 days a year. Urgent trips include sick visits, hospital discharge requests and life-sustaining treatment.  Call the [facility] Line at [phone], at any time, 24 hours a day, 7 days a week. If you are in danger or need immediate medical attention call 911.   Please see education materials related to 08/05/2024 @ 1:00 pm provided by MyChart link.  Care plan and visit instructions communicated with the patient verbally today. Patient agrees to receive a copy in MyChart. Active MyChart status and patient understanding of how to access instructions and care plan via MyChart confirmed with patient.     Telephone follow up appointment with Managed Medicaid care management team member scheduled for:   Olam Ku, RN, BSN [place]  [facility], [facility] RN Care Manager Direct Dial: [phone]  Fax: [phone]    Following is a copy of your plan of care:   Goals Addressed             This Visit's Progress    VBCI RN Care Plan-COPD   On track    Problems:  Chronic Disease Management support and  education needs related to COPD Knowledge Deficits related to COPD  Goal: Over the next 60 days the Patient will demonstrate a decrease COPD in exacerbations as evidenced by chart review and patient report take all medications exactly as prescribed and will call provider for medication related questions as evidenced by chart review and patient report    verbalize basic understanding of COPD disease process and self health management plan as evidenced by chart review and patient report  Interventions:   COPD Interventions: Advised patient to engage in light exercise as tolerated 3-5 days a week to aid in the the management of COPD Advised patient to track and manage COPD triggers Advised patient to self assesses COPD action plan zone and make appointment with provider if in the yellow zone for 48 hours without improvement Assessed social determinant of health barriers Discussed the importance of adequate rest and management of fatigue with COPD Provided education and advised patient to utilize infection prevention strategies to reduce risk of respiratory infection Pt remains aware on the pursed lip breathing and utilized returned demonstration as teach back  Patient Self-Care Activities:  Attend all scheduled provider appointments Attend church or other social activities Call pharmacy for medication refills 3-7 days in advance of running out of medications Call provider office for new concerns or questions  Take medications as prescribed   eliminate smoking in my home identify and avoid work-related triggers identify and remove indoor air pollutants limit outdoor activity during cold weather listen for public air quality announcements every day do breathing exercises every day  eliminate symptom triggers at home follow rescue plan if symptoms flare-up use an extra pillow to sleep get at least 7 to 8 hours of sleep at night use devices that will help like a cane, sock-puller or  reacher practice relaxation or meditation daily do breathing exercises every day do exercises in a comfortable position that makes breathing as easy as possible  Plan:  Telephone follow up appointment with care management team member scheduled for:  08/05/2024 @ 1:00 pm with Rosaline Finlay, RNCM

## 2024-07-12 ENCOUNTER — Other Ambulatory Visit: Payer: Self-pay | Admitting: Neurosurgery

## 2024-07-15 ENCOUNTER — Ambulatory Visit: Admitting: Pulmonary Disease

## 2024-07-21 ENCOUNTER — Telehealth: Payer: Self-pay | Admitting: Neurosurgery

## 2024-07-21 ENCOUNTER — Other Ambulatory Visit: Payer: Self-pay | Admitting: Family Medicine

## 2024-07-21 DIAGNOSIS — F321 Major depressive disorder, single episode, moderate: Secondary | ICD-10-CM

## 2024-07-21 NOTE — Telephone Encounter (Signed)
 Date01/08/26 Provider SM[facility] Med Requesting Gabapentin  300mg  Pharmacy [facility] in [place] / [place] [address]  Patient contact [phone]

## 2024-07-21 NOTE — Telephone Encounter (Signed)
 Patient notified and she has made an appointment.

## 2024-07-24 ENCOUNTER — Other Ambulatory Visit: Payer: Self-pay | Admitting: Family Medicine

## 2024-07-26 ENCOUNTER — Encounter (HOSPITAL_COMMUNITY): Payer: Self-pay

## 2024-07-26 ENCOUNTER — Ambulatory Visit (HOSPITAL_COMMUNITY): Attending: Family Medicine

## 2024-07-26 ENCOUNTER — Other Ambulatory Visit: Payer: Self-pay | Admitting: Family Medicine

## 2024-07-26 DIAGNOSIS — I10 Essential (primary) hypertension: Secondary | ICD-10-CM

## 2024-07-26 NOTE — Therapy (Incomplete)
 " OUTPATIENT PHYSICAL THERAPY THORACOLUMBAR EVALUATION   Patient Name: [name] MRN: [id number] DOB:1976-04-16, 49 y.o., female Today's Date: 07/26/2024  END OF SESSION:   Past Medical History:  Diagnosis Date   Anxiety    Phreesia 12/30/2019   Asthma    COPD (chronic obstructive pulmonary disease) (HCC)    Depression    Dyspnea    GERD (gastroesophageal reflux disease)    Hidradenitis suppurativa    History of hiatal hernia    Hyperlipidemia    Hypertension    IBS (irritable bowel syndrome)    Intervertebral disc disorder with myelopathy, cervical region    OAB (overactive bladder)    Tobacco use    Vitamin B 12 deficiency    Vitamin D  deficiency    Past Surgical History:  Procedure Laterality Date   BIOPSY  05/22/2022   Procedure: BIOPSY;  Surgeon: Eartha Angelia Sieving, MD;  [facility];  Service: Gastroenterology;;   BIOPSY  11/26/2022   Procedure: BIOPSY;  Surgeon: Eartha Angelia Sieving, MD;  [facility];  Service: Gastroenterology;;   ORIN MEDIATE RELEASE Right 04/12/2024   Procedure: Right side carpal tunnel release with ultrasound guidance;  Surgeon: Claudene Penne ORN, MD;  Location: [facility];  Service: Neurosurgery;  Laterality: Right;  Right side carpal tunnel release with ultrasound guidance, possible convert to open   CERVICAL ANTERIOR DISC ARTHROPLASTY, 2 LEVEL N/A 11/10/2023   Procedure: CERVICAL ANTERIOR DISC ARTHROPLASTY, 2 LEVEL;  Surgeon: Claudene Penne ORN, MD;  Location: [facility];  Service: Neurosurgery;  Laterality: N/A;   CERVICAL DISC ARTHROPLASTY  11/10/2023   Procedure: CERVICAL ANTERIOR DISC ARTHROPLASTY Levels 5-7;  Surgeon: Claudene Penne ORN, MD;  Location: [facility];  Service: Neurosurgery;;   CHOLECYSTECTOMY     COLONOSCOPY WITH PROPOFOL  N/A 05/22/2022   Procedure: COLONOSCOPY WITH PROPOFOL ;  Surgeon: Eartha Angelia Sieving, MD;  [facility];  Service: Gastroenterology;  Laterality: N/A;  1215 ASA 2    ESOPHAGOGASTRODUODENOSCOPY (EGD) WITH PROPOFOL  N/A 11/26/2022   Procedure: ESOPHAGOGASTRODUODENOSCOPY (EGD) WITH PROPOFOL ;  Surgeon: Eartha Angelia Sieving, MD;  [facility];  Service: Gastroenterology;  Laterality: N/A;  1:00PM;asa 2   POLYPECTOMY  05/22/2022   Procedure: POLYPECTOMY;  Surgeon: Eartha Angelia, Sieving, MD;  [facility];  Service: Gastroenterology;;   TUBAL LIGATION     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/21/2023   Left arm weakness 10/21/2023   Hidradenitis suppurativa 08/05/2023   Depression, major, single episode, moderate (HCC) 08/05/2023   Irritable bowel syndrome with constipation 06/09/2023   Mixed stress and urge urinary incontinence 04/07/2023   Neck pain 04/01/2023   Early satiety 03/01/2023   Overactive bladder 11/05/2022   Diarrhea 05/05/2022   Loss of weight 05/05/2022   Vitamin B12 deficiency 04/12/2020   Vitamin D  deficiency 04/12/2020   Dizziness 04/12/2020   HTN (hypertension) 12/30/2019   Tobacco abuse 12/30/2019   GERD (gastroesophageal reflux disease) 12/30/2019   Spondylosis 12/30/2019    PCP: [name], FNP   REFERRING PROVIDER: Claudene Penne ORN, MD   REFERRING DIAG: Chronic midline low back pain  without sciatica   Rationale for Evaluation and Treatment: Rehabilitation  THERAPY DIAG:  No diagnosis found.  ONSET DATE: ***  SUBJECTIVE:                                                                                                                                                                                            SUBJECTIVE STATEMENT: ***  PERTINENT HISTORY:  ***  PAIN:  Are you having pain? Yes: NPRS scale: *** Pain location: *** Pain description: *** Aggravating factors: *** Relieving factors: ***  PRECAUTIONS: {Therapy precautions:24002}  RED FLAGS: {PT Red Flags:29287}   WEIGHT BEARING RESTRICTIONS: {Yes ***/No:24003}  FALLS:  Has patient fallen in last 6 months? {fallsyesno:27318}  LIVING ENVIRONMENT: Lives with: {[facility] lives with:25569::lives with their family} Lives in: {Lives in:25570} Stairs: {opstairs:27293} Has following equipment at home: {Assistive devices:23999}  OCCUPATION: ***  PLOF: {PLOF:24004}  PATIENT GOALS: ***  NEXT MD VISIT: ***  OBJECTIVE:  Note: Objective measures were completed at Evaluation unless otherwise noted.  DIAGNOSTIC FINDINGS: 02/03/24 lumbar x-ray  IMPRESSION: 1. No acute findings. 2. Normal alignment with flexion and extension. 3. Aortic Atherosclerosis (ICD10-I70.0).  PATIENT SURVEYS:  Modified Oswestry:  MODIFIED OSWESTRY DISABILITY SCALE  Date: 07/26/24 Score  Pain intensity {ODI 1:32962}  2. Personal care (washing, dressing, etc.) {ODI 2:32963}  3. Lifting {ODI 3:32964}  4. Walking {ODI 4:32965}  5. Sitting {ODI 5:32966}  6. Standing {ODI 6:32967}  7. Sleeping {ODI 7:32968}  8. Social Life {ODI 8:32969}  9. Traveling {ODI 9:32970}  10. Employment/ Homemaking {ODI 10:32971}  Total ***/50   Interpretation of scores: Score Category Description  0-20% Minimal Disability The patient can cope with most living activities. Usually no treatment is indicated apart from advice on lifting, sitting and exercise  21-40% Moderate Disability The patient experiences more pain and difficulty with sitting, lifting and standing. Travel and social life are more difficult and they may be disabled from work. Personal care, sexual activity and sleeping are not grossly affected, and the patient can usually be managed by conservative means   41-60% Severe Disability Pain remains the main problem in this group, but activities of daily living are affected. These patients require a detailed investigation  61-80% Crippled Back pain impinges on all aspects of the patients life. Positive intervention is required  81-100% Bed-bound These patients are either bed-bound or exaggerating their symptoms  Bluford FORBES Zoe DELENA Karon DELENA, et al. Surgery versus conservative management of stable thoracolumbar fracture: the PRESTO feasibility RCT. [place] ([place]): [employer]; 2021 Nov. [facility] Technology Assessment, No. 25.62.) Appendix 3, Oswestry Disability Index category descriptors. Available from: Findjewelers.cz  Minimally Clinically Important Difference (MCID) = 12.8%  COGNITION: Overall cognitive status: {cognition:24006}     SENSATION: {sensation:27233}  MUSCLE LENGTH: Hamstrings: Right *** deg; Left *** deg Debby test: Right *** deg; Left *** deg  POSTURE: {posture:25561}  PALPATION: ***  LUMBAR ROM:   AROM eval  Flexion   Extension   Right lateral flexion   Left lateral flexion   Right rotation   Left rotation    (Blank rows = not tested)  LOWER EXTREMITY ROM:     {AROM/PROM:27142}  Right eval Left eval  Hip flexion    Hip extension    Hip abduction    Hip adduction    Hip internal rotation    Hip external rotation    Knee flexion    Knee extension    Ankle dorsiflexion    Ankle plantarflexion    Ankle inversion    Ankle eversion     (Blank rows = not tested)  LOWER EXTREMITY MMT:    MMT Right eval Left eval  Hip flexion    Hip extension    Hip abduction    Hip adduction    Hip internal rotation    Hip external rotation    Knee flexion    Knee extension    Ankle dorsiflexion    Ankle plantarflexion    Ankle inversion    Ankle eversion     (Blank rows = not tested)  LUMBAR SPECIAL TESTS:  {lumbar special test:25242}  FUNCTIONAL TESTS:   {Functional tests:24029}  GAIT: Distance walked: *** Assistive device utilized: {Assistive devices:23999} Level of assistance: {Levels of assistance:24026} Comments: ***  TREATMENT DATE: ***                                                                                                                              07/26/24: PT evaluation, patient education, and HEP    PATIENT EDUCATION:  Education details: *** Person educated: {Person educated:25204} Education method: {Education Method:25205} Education comprehension: {Education Comprehension:25206}  HOME EXERCISE PROGRAM: ***  ASSESSMENT:  CLINICAL IMPRESSION: Patient is a 49 y.o. female who was seen today for physical therapy evaluation and treatment for ***.   OBJECTIVE IMPAIRMENTS: {opptimpairments:25111}.   ACTIVITY LIMITATIONS: {activitylimitations:27494}  PARTICIPATION LIMITATIONS: {participationrestrictions:25113}  PERSONAL FACTORS: {Personal factors:25162} are also affecting patient's functional outcome.   REHAB POTENTIAL: {rehabpotential:25112}  CLINICAL DECISION MAKING: {clinical decision making:25114}  EVALUATION COMPLEXITY: {Evaluation complexity:25115}   GOALS: Goals reviewed with patient? {yes/no:20286}  SHORT TERM GOALS: Target date: ***  *** Baseline: Goal status: INITIAL  2.  *** Baseline:  Goal status: INITIAL  3.  *** Baseline:  Goal status: INITIAL  4.  *** Baseline:  Goal status: INITIAL  5.  *** Baseline:  Goal status: INITIAL  6.  *** Baseline:  Goal status: INITIAL  LONG TERM GOALS: Target date: ***  *** Baseline:  Goal status: INITIAL  2.  *** Baseline:  Goal status: INITIAL  3.  *** Baseline:  Goal status: INITIAL  4.  *** Baseline:  Goal status: INITIAL  5.  *** Baseline:  Goal status: INITIAL  6.  *** Baseline:  Goal status: INITIAL  PLAN:  PT FREQUENCY: {rehab frequency:25116}  PT DURATION: {rehab duration:25117}  PLANNED INTERVENTIONS:  {rehab planned interventions:25118::97110-Therapeutic exercises,97530- Therapeutic [phone]- Neuromuscular re-education,97535- Self Rjmz,02859- Manual therapy,Patient/Family education}.  PLAN FOR NEXT SESSION: PIERRETTE Lacinda JAYSON Elspeth, PT 08-04-2024, 9:20 AM    Managed Medicaid Authorization Request Treatment Start Date: 2024-08-04  Visit Dx Codes: ***  Functional Tool Score: ***  For all possible CPT codes, reference the Planned Interventions line above.     Check all conditions that are expected to impact treatment: {Conditions expected to impact treatment:{Conditions expected to impact treatment:28273}   If treatment provided at initial evaluation, no treatment charged due to lack of authorization.     "

## 2024-08-03 ENCOUNTER — Other Ambulatory Visit: Payer: Self-pay

## 2024-08-03 ENCOUNTER — Telehealth: Payer: Self-pay

## 2024-08-03 MED ORDER — OLMESARTAN MEDOXOMIL 20 MG PO TABS: 10.0000 mg | ORAL_TABLET | Freq: Every day | ORAL | 2 refills | Status: AC

## 2024-08-03 NOTE — Telephone Encounter (Signed)
 Sent to pharmacy

## 2024-08-03 NOTE — Telephone Encounter (Signed)
 Copied from CRM [id number]. Topic: Clinical - Medication Refill >> Aug 03, 2024 10:48 AM Jasmin G wrote: Medication: olmesartan  (BENICAR ) 20 MG tablet. Pt requested for prescription to be refilled ASAP as she is out of med.  Has the patient contacted their pharmacy? Yes (Agent: If no, request that the patient contact the pharmacy for the refill. If patient does not wish to contact the pharmacy document the reason why and proceed with request.) (Agent: If yes, when and what did the pharmacy advise?)  This is the patient's preferred pharmacy:  [facility] [place] [address] [place] - [address] [place] [address] Phone: [phone] Fax: [phone]  Is this the correct pharmacy for this prescription? Yes If no, delete pharmacy and type the correct one.   Has the prescription been filled recently? Yes  Is the patient out of the medication? Yes  Has the patient been seen for an appointment in the last year OR does the patient have an upcoming appointment? Yes  Can we respond through MyChart? No  Agent: Please be advised that Rx refills may take up to 3 business days. We ask that you follow-up with your pharmacy.

## 2024-08-05 ENCOUNTER — Other Ambulatory Visit: Payer: Self-pay

## 2024-08-05 NOTE — Patient Instructions (Signed)
 Visit Information  [name] was given information about Medicaid Managed Care team care coordination services as a part of their Healthy [facility] Medicaid benefit. Towanda B Schwartzkopf   If you would like to schedule transportation through your Healthy [facility] plan, please call the following number at least 2 days in advance of your appointment: [phone]  For information about your ride after you set it up, call Ride Assist at [phone]. Use this number to activate a Will Call pickup, or if your transportation is late for a scheduled pickup. Use this number, too, if you need to make a change or cancel a previously scheduled reservation.  If you need transportation services right away, call [phone]. The after-hours call center is staffed 24 hours to handle ride assistance and urgent reservation requests (including discharges) 365 days a year. Urgent trips include sick visits, hospital discharge requests and life-sustaining treatment.  Call the [facility] Line at [phone], at any time, 24 hours a day, 7 days a week. If you are in danger or need immediate medical attention call 911.   Patient verbalizes understanding of instructions and care plan provided today and agrees to view in MyChart. Active MyChart status and patient understanding of how to access instructions and care plan via MyChart confirmed with patient.     Telephone follow up appointment with Managed Medicaid care management team member scheduled for: 09/02/24 at 1 PM  Rosaline Finlay, RN MSN [place]  [facility] Population Health RN Care Manager Direct Dial: [phone]  Fax: [phone]   Following is a copy of your plan of care:   Goals Addressed             This Visit's Progress    [facility] RN Care Plan-COPD   On track    Problems:  Chronic Disease Management support and education needs related to COPD Knowledge Deficits related to COPD  Goal: Over the next 60 days the Patient will  demonstrate a decrease COPD in exacerbations as evidenced by chart review and patient report verbalize basic understanding of COPD disease process and self health management plan as evidenced by patient verbalization of COPD action plan  Interventions:   COPD Interventions: Advised patient to engage in light exercise as tolerated 3-5 days a week to aid in the the management of COPD Advised patient to track and manage COPD triggers Advised patient to self assesses COPD action plan zone and make appointment with provider if in the yellow zone for 48 hours without improvement Discussed the importance of adequate rest and management of fatigue with COPD Provided education and advised patient to utilize infection prevention strategies to reduce risk of respiratory infection Reviewed prescribed inhalers and patient compliance. Advised patient to discuss any concerns regarding medication regimen with pulmonology at upcoming visit Advised patient discuss concerns related to CPAP compliance and fitting with pulmonology at upcoming visit  Patient Self-Care Activities:  Attend all scheduled provider appointments Attend church or other social activities Call pharmacy for medication refills 3-7 days in advance of running out of medications Call provider office for new concerns or questions  Take medications as prescribed   eliminate smoking in my home identify and avoid work-related triggers identify and remove indoor air pollutants limit outdoor activity during cold weather listen for public air quality announcements every day do breathing exercises every day eliminate symptom triggers at home follow rescue plan if symptoms flare-up use an extra pillow to sleep get at least 7 to 8 hours of sleep at night  use devices that will help like a cane, sock-puller or reacher practice relaxation or meditation daily do breathing exercises every day do exercises in a comfortable position that makes  breathing as easy as possible  Plan:  Telephone follow up appointment with care management team member scheduled for:  09/02/24 at 1 PM with Rosina Forte, [facility] for [facility]

## 2024-08-05 NOTE — Patient Outreach (Signed)
 Complex Care Management   Visit Note  08/05/2024  Name:  [name] MRN: [id number] DOB: [birth year]  Situation: Referral received for Complex Care Management related to COPD I obtained verbal consent from Patient.  Visit completed with Patient  on the phone  Background:   Past Medical History:  Diagnosis Date   Anxiety    Phreesia 12/30/2019   Asthma    COPD (chronic obstructive pulmonary disease) (HCC)    Depression    Dyspnea    GERD (gastroesophageal reflux disease)    Hidradenitis suppurativa    History of hiatal hernia    Hyperlipidemia    Hypertension    IBS (irritable bowel syndrome)    Intervertebral disc disorder with myelopathy, cervical region    OAB (overactive bladder)    Tobacco use    Vitamin B 12 deficiency    Vitamin D  deficiency     Assessment: Patient Reported Symptoms:  Cognitive Cognitive Status: Able to follow simple commands, Alert and oriented to person, place, and time, Normal speech and language skills Cognitive/Intellectual Conditions Management [RPT]: None reported or documented in medical history or problem list   Health Maintenance Behaviors: Annual physical exam, Healthy diet Health Facilitated by: Rest, Healthy diet, Pain control  Neurological Neurological Review of Symptoms: Dizziness Neurological Management Strategies: Coping strategies, Medication therapy Neurological Comment: Patient reports continued dizziness/motion sickness, worse in the mornings when she first gets up, and relieved with OTC medication as needed  HEENT HEENT Symptoms Reported: Not assessed      Cardiovascular Cardiovascular Symptoms Reported: Dizziness Does patient have uncontrolled Hypertension?: Yes Is patient checking Blood Pressure at home?: Yes Patient's Recent BP reading at home: 127/77 Cardiovascular Management Strategies: Coping strategies, Medication therapy, Routine screening  Respiratory Respiratory Symptoms Reported: Dry cough, Wheezing,  Other: Other Respiratory Symptoms: Patient reports chronic cough, compliance with prescribed inhalers. She reports she does feel like there is mucus in her chest, but is unable to get it up. Patient reports shortness of breath with exertion such as doing household chores. PRN Albuterol  is effective in treating shortness of breath Additional Respiratory Details: Patient reports she was sick for pulmonology visit, and rescheduled to 08/11/24. Patient reports she is using CPAP daily, but finds herself unintentionally taking it off throughout the night. She reports that she is compliant with CPAP for at least 4 hours. Patient reports current mask is irritating her skin around her mouth. She plans to discuss this with pulmonology at upcoming appointment Respiratory Management Strategies: Adequate rest, Coping strategies, CPAP, Routine screening, Medication therapy  Endocrine Endocrine Symptoms Reported: Not assessed Is patient diabetic?: No    Gastrointestinal Gastrointestinal Symptoms Reported: No symptoms reported Additional Gastrointestinal Details: Patient reports a good appetite and regular BMs Gastrointestinal Management Strategies: Coping strategies, Medication therapy    Genitourinary Genitourinary Symptoms Reported: Incontinence Additional Genitourinary Details: Patient reports occasional episodes of incontince at nighttime, but reports incontinence is much improved Genitourinary Management Strategies: Incontinence garment/pad  Integumentary Integumentary Symptoms Reported: Other Other Integumentary Symptoms: Patient is using fragrence free lotion around her mouth due to irritation from CPAP Skin Management Strategies: Coping strategies, Routine screening  Musculoskeletal Musculoskelatal Symptoms Reviewed: Back pain Additional Musculoskeletal Details: Patient reports she has f/u with neurosurgery 08/10/24 regarding back pain Musculoskeletal Management Strategies: Coping strategies, Medication  therapy, Routine screening, Exercise Musculoskeletal Comment: Patient reports she accidently missed initial PT evaluation. It has now been rescheduled to 08/19/24. Patient reports she has been doing exercises at home Falls in the past year?: No Number  of falls in past year: 1 or less Was there an injury with Fall?: No Fall Risk Category Calculator: 0 Patient Fall Risk Level: Low Fall Risk Patient at Risk for Falls Due to: Medication side effect Fall risk Follow up: Falls evaluation completed, Education provided, Falls prevention discussed  Psychosocial Psychosocial Symptoms Reported: Depression - if selected complete PHQ 2-9 Additional Psychological Details: Patient reports she is still on the wait list for earlier appointment with counselor Behavioral Management Strategies: Coping strategies Major Change/Loss/Stressor/Fears (CP): Medical condition, self Techniques to Cope with Loss/Stress/Change: Diversional activities Quality of Family Relationships: helpful, involved, supportive Do you feel physically threatened by others?: No    08/05/2024    PHQ2-9 Depression Screening   Little interest or pleasure in doing things Not at all  Feeling down, depressed, or hopeless Nearly every day  PHQ-2 - Total Score 3  Trouble falling or staying asleep, or sleeping too much Not at all  Feeling tired or having little energy Several days  Poor appetite or overeating  Not at all  Feeling bad about yourself - or that you are a failure or have let yourself or your family down Several days  Trouble concentrating on things, such as reading the newspaper or watching television Not at all  Moving or speaking so slowly that other people could have noticed.  Or the opposite - being so fidgety or restless that you have been moving around a lot more than usual Nearly every day  Thoughts that you would be better off dead, or hurting yourself in some way Not at all  PHQ2-9 Total Score 8  If you checked off any  problems, how difficult have these problems made it for you to do your work, take care of things at home, or get along with other people Somewhat difficult  Depression Interventions/Treatment Medication    There were no vitals filed for this visit. Pain Scale: 0-10 Pain Score: 8  Pain Type: Chronic pain Pain Location: Back Pain Orientation: Lower  Medications Reviewed Today     Reviewed by Arno Rosaline SQUIBB, RN (Registered Nurse) on 08/05/24 at 1317  Med List Status: <None>   Medication Order Taking? Sig Documenting Provider Last Dose Status Informant  Cholecalciferol  (VITAMIN D ) 125 MCG (5000 UT) CAPS [id number]  Take 5,000 Units by mouth daily. [provider]  Active Self  cyclobenzaprine  (FLEXERIL ) 10 MG tablet [id number]  Take 1 tablet by mouth three times daily as needed for muscle spasm Gregory Edsel Ruth, PA  Active   escitalopram  (LEXAPRO ) 10 MG tablet [id number]  Take 1 tablet by mouth once daily Del Orbe Polanco, Iliana, FNP  Active   furosemide  (LASIX ) 20 MG tablet [id number]  Take 1 tablet (20 mg total) by mouth as needed for up to 1 dose for edema or fluid. For Leg swelling Del Wilhelmena Falter, [place], [place]  Active   gabapentin  (NEURONTIN ) 300 MG capsule [id number]  TAKE 2 CAPSULES BY MOUTH THREE TIMES DAILY Gregory Edsel Ruth, [place]  Active   linaclotide  (LINZESS ) 145 MCG CAPS capsule [id number]  Take 1 capsule (145 mcg total) by mouth daily. Carlan, Chelsea L, NP  Active Self  metoprolol  tartrate (LOPRESSOR ) 50 MG tablet [id number]  Take 1 tablet by mouth twice daily Del Orbe Polanco, [place]iana, FNP  Active   olmesartan  (BENICAR ) 20 MG tablet [id number]  Take 0.5 tablets (10 mg total) by mouth daily. Del Wilhelmena Falter, [place]ilar[place], FNP  Active   Omega-3 Fatty Acids (FISH OIL ) 1000 MG CAPS [id number]  Take 2 capsules (2,000 mg total) by mouth in the morning and at bedtime. Del Wilhelmena Falter, Hilario, FNP  Active Self  omeprazole  (PRILOSEC) 40 MG capsule [id number]  Take 1 capsule (40 mg  total) by mouth daily. Carlan, Chelsea L, NP  Active Self  ondansetron  (ZOFRAN ) 4 MG tablet [id number]  TAKE 1 TABLET BY MOUTH EVERY 8 HOURS AS NEEDED FOR NAUSEA OR VOMITING Carlan, Chelsea L, NP  Active Self  oxybutynin  (DITROPAN -XL) 10 MG 24 hr tablet [id number]  Take 10 mg by mouth at bedtime. [provider]  Active Self  polyethylene glycol powder (GLYCOLAX /MIRALAX ) 17 GM/SCOOP powder [id number]  Take 17 g by mouth daily as needed for moderate constipation. Dissolve 1 capful (17g) in 4-8 ounces of liquid and take by mouth daily. Gregory Edsel Ruth, PA  Active   rosuvastatin  (CRESTOR ) 5 MG tablet [id number]  Take 1 tablet by mouth once daily Del Orbe Polanco, [place]iana, FNP  Active   senna (SENOKOT) 8.6 MG TABS tablet [id number]  Take 1 tablet (8.6 mg total) by mouth 2 (two) times daily as needed for mild constipation. Gregory Edsel Ruth, [place]  Active   SYMBICORT  160-4.5 MCG/ACT inhaler [id number]  Inhale 2 puffs into the lungs 2 (two) times daily. Tamea Dedra CROME, MD  Active Self  Tiotropium Bromide Monohydrate  (SPIRIVA  RESPIMAT) 2.5 MCG/ACT AERS [id number]  Inhale 2 puffs into the lungs daily. Tamea Dedra CROME, MD  Active Self  VENTOLIN  HFA 108 [phone] Base) MCG/ACT inhaler [id number]  INHALE 2 PUFFS BY MOUTH EVERY 6 HOURS AS NEEDED FOR WHEEZING OR SHORTNESS OF BREATH Del Orbe Polanco, [place], FNP  Active   vitamin B-12 (CYANOCOBALAMIN ) 1000 MCG tablet [id number]  Take 1 tablet (1,000 mcg total) by mouth daily. Moishe Chiquita HERO, NP  Active Self  Med List Note (Ashe, Dana K, CPhT 06/30/18 1857): Some medications through [facility]            Recommendation:   Specialty provider follow-up pulmonology, PT as scheduled Continue Current Plan of Care  Follow Up Plan:   Telephone follow up appointment date/time:  09/02/24 at 1 PM with Rosina Forte, RNCM for [facility]  Rosaline Finlay, RN MSN [place]  [facility] RN Care Manager Direct  Dial: [phone]  Fax: [phone]

## 2024-08-08 ENCOUNTER — Ambulatory Visit: Admitting: Pulmonary Disease

## 2024-08-10 ENCOUNTER — Encounter: Payer: Self-pay | Admitting: Physician Assistant

## 2024-08-10 ENCOUNTER — Ambulatory Visit (INDEPENDENT_AMBULATORY_CARE_PROVIDER_SITE_OTHER): Admitting: Physician Assistant

## 2024-08-10 VITALS — BP 148/92 | Ht 62.0 in | Wt 235.0 lb

## 2024-08-10 DIAGNOSIS — R200 Anesthesia of skin: Secondary | ICD-10-CM | POA: Diagnosis not present

## 2024-08-10 DIAGNOSIS — M545 Low back pain, unspecified: Secondary | ICD-10-CM | POA: Diagnosis not present

## 2024-08-10 DIAGNOSIS — R202 Paresthesia of skin: Secondary | ICD-10-CM | POA: Diagnosis not present

## 2024-08-10 MED ORDER — GABAPENTIN 300 MG PO CAPS: 600.0000 mg | ORAL_CAPSULE | Freq: Three times a day (TID) | ORAL | 3 refills | Status: AC

## 2024-08-10 MED ORDER — CYCLOBENZAPRINE HCL 10 MG PO TABS: 10.0000 mg | ORAL_TABLET | Freq: Three times a day (TID) | ORAL | 3 refills | Status: AC | PRN

## 2024-08-10 NOTE — Progress Notes (Signed)
" ° °  REFERRING PHYSICIAN:  Del Wilhelmena Lloyd Sola, Fnp [phone] [address] [place],  [place] [address]  DOS: 04/12/24, R US  guided CTR  HISTORY OF PRESENT ILLNESS: [name] comes in today for follow-up and renewal of pain prescriptions.  She did have improvement in her hand after carpal tunnel release, but still has numbness that is present.  Her back pain is very bothersome to her.  It radiates to bilateral lower extremities and affects her daily life and ability to form ADLs.  She would like to continue Flexeril  and gabapentin  as these are most helpful for her pain.  She is due to start physical therapy for her back next week.  No new weakness present or saddle anesthesia/incontinence.   PHYSICAL EXAMINATION:  NEUROLOGICAL:  General: In no acute distress.   Awake, alert, oriented to person, place, and time.  Pupils equal round and reactive to light.  Facial tone is symmetric.    Strength:Right hand strength is to baseline  Continues to be full strength in bilateral lower extremities.  Incision c/d/I.    Imaging:  EXAM: LUMBAR SPINE - COMPLETE 4+ VIEW   COMPARISON:  CT abdomen and pelvis 04/20/2023.   FINDINGS: There is no evidence of lumbar spine fracture. Alignment is normal with flexion and extension. Intervertebral disc spaces are maintained. There are atherosclerotic calcifications of the aorta.   IMPRESSION: 1. No acute findings. 2. Normal alignment with flexion and extension. 3. Aortic Atherosclerosis (ICD10-I70.0).    Assessment / Plan: [name] is doing well after right ultrasound-guided carpal tunnel release on 04/12/2024.  She does have as expected some residual tingling in the tips of her fingers bilaterally likely from her cervical spinal issues and double crush type phenomenon.  In regards to her back pain, encouraged her to make sure that she goes through with physical therapy starting next week.  Once complete, we will see how patient is  doing.  If she continues to worsen, would plan to order lumbar MRI and likely referral to pain medicine.   Refilled gabapentin  and Flexeril .  Explained to the patient that moving forward she would need to have refills by her primary care provider.  She stated that her primary care provider is currently out on maternity leave so I did give her a few extra refills, but would like her to have these medications continued by her family medicine team.    Advised to contact the office if any questions or concerns arise.   Lyle Decamp Dept of Neurosurgery  "

## 2024-08-11 ENCOUNTER — Ambulatory Visit: Admitting: Pulmonary Disease

## 2024-08-11 ENCOUNTER — Encounter: Payer: Self-pay | Admitting: Pulmonary Disease

## 2024-08-11 VITALS — BP 136/89 | HR 89 | Temp 97.6°F | Ht 62.0 in | Wt 235.0 lb

## 2024-08-11 DIAGNOSIS — Z6841 Body Mass Index (BMI) 40.0 and over, adult: Secondary | ICD-10-CM | POA: Diagnosis not present

## 2024-08-11 DIAGNOSIS — J4489 Other specified chronic obstructive pulmonary disease: Secondary | ICD-10-CM

## 2024-08-11 DIAGNOSIS — J209 Acute bronchitis, unspecified: Secondary | ICD-10-CM | POA: Diagnosis not present

## 2024-08-11 DIAGNOSIS — F17210 Nicotine dependence, cigarettes, uncomplicated: Secondary | ICD-10-CM | POA: Diagnosis not present

## 2024-08-11 DIAGNOSIS — R0602 Shortness of breath: Secondary | ICD-10-CM

## 2024-08-11 DIAGNOSIS — G4733 Obstructive sleep apnea (adult) (pediatric): Secondary | ICD-10-CM

## 2024-08-11 DIAGNOSIS — E669 Obesity, unspecified: Secondary | ICD-10-CM | POA: Diagnosis not present

## 2024-08-11 DIAGNOSIS — J449 Chronic obstructive pulmonary disease, unspecified: Secondary | ICD-10-CM

## 2024-08-11 DIAGNOSIS — R918 Other nonspecific abnormal finding of lung field: Secondary | ICD-10-CM

## 2024-08-11 MED ORDER — AZITHROMYCIN 250 MG PO TABS: ORAL_TABLET | ORAL | 0 refills | Status: AC

## 2024-08-11 NOTE — Progress Notes (Signed)
 "  Subjective:    Patient ID: [name], female    DOB: [birth year], 49 y.o.   MRN: [id number]  Patient Care Team: Del Wilhelmena Lloyd Sola, FNP as PCP - General (Family Medicine) Arno Rosaline SQUIBB, RN as Registered Nurse  Chief Complaint  Patient presents with   COPD    Cough and chest congestion.    Obstructive Sleep Apnea    Patient is trying to use CPAP every night. She reports that she can't keep mask on. Has appt in February with Dr. Jess.     BACKGROUND/INTERVAL:Patient is a 49 year old current smoker (79.5 PY) who presents for follow-up of shortness of breath and cough.  She has COPD/asthmatic bronchitis.  Also has obstructive sleep apnea.  She was last evaluated here on 05 May 2024.  HPI Discussed the use of AI scribe software for clinical note transcription with the patient, who gave verbal consent to proceed.  History of Present Illness   [name] is a 49 year old female with COPD, asthmatic bronchitis, and obstructive sleep apnea who presents with difficulty breathing and CPAP intolerance.  She experiences difficulty breathing, particularly during physical activity, describing it as a painful sensation in her chest. She frequently uses her albuterol  inhaler, especially when active. Weight gain has exacerbated her breathing difficulties, causing her to lose her breath quickly.  She struggles with CPAP machine use for obstructive sleep apnea, finding it challenging to keep it on throughout the night. She attempts to reapply the CPAP if it comes off during sleep.  She continues to smoke, though she has reduced her consumption to less than a pack a day from two packs. She and her children are making efforts to quit smoking and avoid smoking indoors.  She reports increased congestion and wheezing, which she attributes to a severe head cold earlier in the month that lasted three days and led to rescheduling her appointment. She feels congested and  struggles to clear it effectively. No known allergies.     DATA 05/05/2024 PFTs:FEV1 of 2.15 L or 80% predicted, FVC of 2.80 L or 83% predicted, FEV1/FVC of 77%, no bronchodilator response.  Lung volumes normal with minimal air trapping noted diffusion capacity moderately reduced.  This consistent with mild obstructive airways disease and moderate diffusion defect.   Review of Systems A 10 point review of systems was performed and it is as noted above otherwise negative.   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/21/2023   Left arm weakness 10/21/2023   Hidradenitis suppurativa 08/05/2023   Depression, major, single episode, moderate (HCC) 08/05/2023   Irritable bowel syndrome with constipation 06/09/2023   Mixed stress and urge urinary incontinence 04/07/2023   Neck pain 04/01/2023   Early satiety 03/01/2023   Overactive bladder 11/05/2022   Diarrhea 05/05/2022   Loss of weight 05/05/2022   Vitamin B12 deficiency 04/12/2020   Vitamin D  deficiency 04/12/2020   Dizziness 04/12/2020   HTN (hypertension) 12/30/2019   Tobacco abuse 12/30/2019   GERD (gastroesophageal reflux disease) 12/30/2019   Spondylosis 12/30/2019    Social History   Tobacco Use   Smoking  status: Every Day    Current packs/day: 1.50    Average packs/day: 2.4 packs/day for 33.1 years (80.2 ttl pk-yrs)    Types: Cigarettes    Start date: 1993    Passive exposure: Current   Smokeless tobacco: Never  Substance Use Topics   Alcohol use: Not Currently    Allergies[1]  Active Medications[2]  Immunization History  Administered  Date(s) Administered   Moderna Sars-Covid-2 Vaccination 10/28/2019, 11/29/2019   Tdap 11/05/2022        Objective:     Vitals:   08/11/24 1506  BP: 136/89  Pulse: 89  Temp: 97.6 F (36.4 C)  Height: 5' 2 (1.575 m)  Weight: 235 lb (106.6 kg)  SpO2: 98%  TempSrc: Temporal  BMI (Calculated): 42.97     SpO2: 98 %  GENERAL: Obese, fully ambulatory.  No conversational dyspnea. HEAD: Normocephalic, atraumatic.  EYES: Pupils equal, round, reactive to light.  No scleral icterus.  MOUTH: Edentulous, wears dentures.  Mallampati IV airway, oral mucosa moist.  Coated tongue, no thrush. NECK: Supple. No thyromegaly. Trachea midline. No JVD.  No adenopathy. PULMONARY: Good air entry bilaterally.  Wheezing noted. CARDIOVASCULAR: S1 and S2. Regular rate and rhythm.  ABDOMEN: Significant truncal obesity. MUSCULOSKELETAL: No joint deformity, no clubbing, no edema.  NEUROLOGIC: No overt focal deficit, no gait disturbance, speech is fluent. SKIN: Intact,warm,dry. PSYCH: Mood and behavior normal.        Assessment & Plan:     ICD-10-CM   1. Stage 1 mild COPD by GOLD classification (HCC)  J44.9 AMB referral to pulmonary rehabilitation    2. Shortness of breath  R06.02 AMB referral to pulmonary rehabilitation    3. Acute bronchitis with COPD (HCC)  J44.0    J20.9     4. OSA (obstructive sleep apnea)  G47.33     5. Multiple lung nodules on CT  R91.8     6. Morbid obesity (HCC)  E66.01     7. Tobacco dependence due to cigarettes  F17.210       Orders Placed This Encounter  Procedures   AMB referral to pulmonary rehabilitation    Referral Priority:   Routine    Referral Type:   Consultation    Number of Visits Requested:   1    Meds ordered this encounter  Medications   azithromycin  (ZITHROMAX ) 250 MG tablet    Sig: Take 2 tablets (500 mg) on  Day 1,  followed by 1 tablet (250 mg) once daily on Days 2 through 5.    Dispense:  6 each    Refill:  0   Discussion:     COPD with asthmatic bronchitis Chronic obstructive pulmonary disease with asthmatic bronchitis, exacerbated by physical activity and recent upper respiratory infection. Reports increased dyspnea and wheezing, requiring frequent albuterol  use. Difficulty maintaining CPAP therapy at night, contributing to fatigue. - Initiated pulmonary rehabilitation program - Encouraged continued use of CPAP during sleep and naps - Prescribed Azithromycin  Dosepak  Acute bronchitis Recent severe upper respiratory infection with symptoms of congestion and wheezing, likely contributing to exacerbation of COPD symptoms. - Prescribed medication for congestion with anti-inflammatory and antibiotic properties  Tobacco use disorder Current smoker, reduced from two packs per day to less than one pack per day. Actively attempting to quit smoking with family support. - Encouraged continued reduction in smoking with goal of cessation  Obesity Contributing to increased dyspnea and difficulty with physical activity. Weight gain noted, impacting respiratory function. - Initiated pulmonary rehabilitation program  to aid in weight management and improve respiratory function     Follow-up in 3 months.  Advised if symptoms do not improve or worsen, to please contact office for sooner follow up or seek emergency care.    I spent 30 minutes of dedicated to the care of this patient on the date of this encounter to include pre-visit review of records, face-to-face time with the patient discussing conditions above, post visit ordering of testing, clinical documentation with the electronic health record, making appropriate referrals as documented, and communicating necessary findings to members of the patients care team.     C. Leita Sanders, MD Advanced Bronchoscopy PCCM [facility]    *This note was generated using voice recognition software/Dragon and/or AI transcription program.  Despite best efforts to  proofread, errors can occur which can change the meaning. Any transcriptional errors that result from this process are unintentional and may not be fully corrected at the time of dictation.     [1]  Allergies Allergen Reactions   Nitrogen Oxide    Other     laughing gas= skin feels like it burning  [2]  Current Meds  Medication Sig   azithromycin  (ZITHROMAX ) 250 MG tablet Take 2 tablets (500 mg) on  Day 1,  followed by 1 tablet (250 mg) once daily on Days 2 through 5.   Cholecalciferol  (VITAMIN D ) 125 MCG (5000 UT) CAPS Take 5,000 Units by mouth daily.   cyclobenzaprine  (FLEXERIL ) 10 MG tablet Take 1 tablet (10 mg total) by mouth 3 (three) times daily as needed. for muscle spams   escitalopram  (LEXAPRO ) 10 MG tablet Take 1 tablet by mouth once daily   furosemide  (LASIX ) 20 MG tablet Take 1 tablet (20 mg total) by mouth as needed for up to 1 dose for edema or fluid. For Leg swelling   gabapentin  (NEURONTIN ) 300 MG capsule Take 2 capsules (600 mg total) by mouth 3 (three) times daily.   linaclotide  (LINZESS ) 145 MCG CAPS capsule Take 1 capsule (145 mcg total) by mouth daily.   metoprolol  tartrate (LOPRESSOR ) 50 MG tablet Take 1 tablet by mouth twice daily   olmesartan  (BENICAR ) 20 MG tablet Take 0.5 tablets (10 mg total) by mouth daily.   Omega-3 Fatty Acids (FISH OIL ) 1000 MG CAPS Take 2 capsules (2,000 mg total) by mouth in the morning and at bedtime.   omeprazole  (PRILOSEC) 40 MG capsule Take 1 capsule (40 mg total) by mouth daily.   ondansetron  (ZOFRAN ) 4 MG tablet TAKE 1 TABLET BY MOUTH EVERY 8 HOURS AS NEEDED FOR NAUSEA OR VOMITING   oxybutynin  (DITROPAN -XL) 10 MG 24 hr tablet Take 10 mg by mouth at bedtime.   rosuvastatin  (CRESTOR ) 5 MG tablet Take 1 tablet by mouth once daily   SYMBICORT  160-4.5 MCG/ACT inhaler Inhale 2 puffs into the lungs 2 (two) times daily.   Tiotropium Bromide Monohydrate  (SPIRIVA  RESPIMAT) 2.5 MCG/ACT AERS Inhale 2 puffs into the lungs daily.   VENTOLIN  HFA  108 (90 Base) MCG/ACT inhaler INHALE 2 PUFFS BY MOUTH EVERY 6 HOURS AS NEEDED FOR WHEEZING OR SHORTNESS OF BREATH   vitamin B-12 (CYANOCOBALAMIN ) 1000 MCG tablet Take 1 tablet (1,000 mcg total) by mouth daily.   "

## 2024-08-11 NOTE — Patient Instructions (Signed)
 VISIT SUMMARY:  During your visit, we discussed your breathing difficulties, CPAP intolerance, recent upper respiratory infection, smoking habits, and weight gain. We have developed a plan to address each of these issues to improve your overall health and well-being.  YOUR PLAN:  -COPD WITH ASTHMATIC BRONCHITIS: Chronic obstructive pulmonary disease (COPD) with asthmatic bronchitis is a lung condition that makes it hard to breathe and is worsened by physical activity and infections. We have started you on a pulmonary rehabilitation program and prescribed medication for congestion. Please continue using your CPAP machine during sleep and naps to help with your breathing.  -ACUTE BRONCHITIS: Acute bronchitis is an infection of the airways that causes congestion and wheezing. This has likely worsened your COPD symptoms. We have prescribed medication to help with the congestion and inflammation.  -TOBACCO USE DISORDER: Tobacco use disorder is a condition where you are dependent on smoking. You have reduced your smoking from two packs a day to less than one pack a day. Please continue to reduce your smoking with the goal of quitting completely.  -OBESITY: Obesity is a condition where excess body weight negatively affects your health. It is contributing to your breathing difficulties and making physical activity harder. We have started you on a pulmonary rehabilitation program to help manage your weight and improve your breathing.  INSTRUCTIONS:  Please follow the prescribed medication regimen for congestion and continue using your CPAP machine during sleep and naps. Attend the pulmonary rehabilitation program to help with your breathing and weight management. Keep working on reducing your smoking with the goal of quitting completely. Follow up with us  if you have any concerns or if your symptoms worsen.

## 2024-08-17 ENCOUNTER — Encounter: Payer: Self-pay | Admitting: Sleep Medicine

## 2024-08-17 ENCOUNTER — Ambulatory Visit: Admitting: Sleep Medicine

## 2024-08-17 VITALS — BP 138/88 | HR 71 | Temp 98.0°F | Ht 62.0 in | Wt 236.8 lb

## 2024-08-17 DIAGNOSIS — G4733 Obstructive sleep apnea (adult) (pediatric): Secondary | ICD-10-CM | POA: Diagnosis not present

## 2024-08-17 DIAGNOSIS — I10 Essential (primary) hypertension: Secondary | ICD-10-CM

## 2024-08-17 DIAGNOSIS — F17210 Nicotine dependence, cigarettes, uncomplicated: Secondary | ICD-10-CM | POA: Diagnosis not present

## 2024-08-17 NOTE — Patient Instructions (Signed)
" °  Patient Instructions Need to be using your CPAP every night, minimum of 7-8 hours a night.  Change equipment as directed. Wash your tubing with warm soap and water  weekly, hang to dry. Wash humidifier portion weekly. Use bottled, distilled water  and change daily Be aware of reduced alertness and do not drive or operate heavy machinery if experiencing this or drowsiness.  Exercise encouraged, as tolerated. Healthy weight management discussed.  Avoid or decrease alcohol consumption and medications that make you more sleepy, if possible. Notify if persistent daytime sleepiness occurs even with consistent use of PAP therapy.   Change CPAP supplies... Every month Mask cushions and/or nasal pillows CPAP machine filters Every 3 months Mask frame (not including the headgear) CPAP tubing Every 6 months Mask headgear Chin strap (if applicable) Humidifier water  tub   Be aware of reduced alertness and do not drive or operate heavy machinery if experiencing this or drowsiness.  Exercise encouraged, as tolerated. Encouraged proper weight management.  Important to get eight or more hours of sleep  Limiting the use of the computer and television before bedtime.  Decrease naps during the day, so night time sleep will become enhanced.  Limit caffeine, and sleep deprivation.  HTN, stroke, uncontrolled diabetes and heart failure are potential risk factors.  Risk of untreated sleep apnea including cardiac arrhthymias, stroke, DM, pulm HTN.           "

## 2024-08-17 NOTE — Progress Notes (Signed)
 "      Name:[name] MRN: [id number] DOB: [birth year]   CHIEF COMPLAINT:  CPAP F/U   HISTORY OF PRESENT ILLNESS: [name] is a 49 y.o. w/ a h/o OSA, HTN, COPD, hyperlipidemia, anxiety, depression and morbid obesity who presents for CPAP follow up visit. Reports difficulty using CPAP therapy every night for the entire night due to her mask falling off during the night. States that she is unaware of trying to take off mask in the night. She is currently using the Airfit F40 FFM.    EPWORTH SLEEP SCORE    05/05/2024    3:07 PM  Results of the Epworth flowsheet  Sitting and reading 0  Watching TV 1  Sitting, inactive in a public place (e.g. a theatre or a meeting) 0  As a passenger in a car for an hour without a break 0  Lying down to rest in the afternoon when circumstances permit 0  Sitting and talking to someone 0  Sitting quietly after a lunch without alcohol 0  In a car, while stopped for a few minutes in traffic 0  Total score 1    PAST MEDICAL HISTORY :   has a past medical history of Anxiety, Asthma, COPD (chronic obstructive pulmonary disease) (HCC), Depression, Dyspnea, GERD (gastroesophageal reflux disease), Hidradenitis suppurativa, History of hiatal hernia, Hyperlipidemia, Hypertension, IBS (irritable bowel syndrome), Intervertebral disc disorder with myelopathy, cervical region, OAB (overactive bladder), Tobacco use, Vitamin B 12 deficiency, and Vitamin D  deficiency.  has a past surgical history that includes Cholecystectomy; Tubal ligation; Colonoscopy with propofol  (N/A, 05/22/2022); polypectomy (05/22/2022); biopsy (05/22/2022); Esophagogastroduodenoscopy (egd) with propofol  (N/A, 11/26/2022); biopsy (11/26/2022); Cervical disc arthroplasty (11/10/2023); Cervical anterior disc arthroplasty, 2 level (N/A, 11/10/2023); and Carpal tunnel release (Right, 04/12/2024). Prior to Admission medications  Medication Sig Start Date End Date Taking? Authorizing Provider   Cholecalciferol  (VITAMIN D ) 125 MCG (5000 UT) CAPS Take 5,000 Units by mouth daily.   Yes [provider]  cyclobenzaprine  (FLEXERIL ) 10 MG tablet Take 1 tablet (10 mg total) by mouth 3 (three) times daily as needed. for muscle spams 08/10/24  Yes Ulis Bottcher, PA-C  escitalopram  (LEXAPRO ) 10 MG tablet Take 1 tablet by mouth once daily 07/21/24  Yes Del Orbe Polanco, Iliana, FNP  furosemide  (LASIX ) 20 MG tablet Take 1 tablet (20 mg total) by mouth as needed for up to 1 dose for edema or fluid. For Leg swelling 05/17/24  Yes Del Orbe Polanco, [place], FNP  gabapentin  (NEURONTIN ) 300 MG capsule Take 2 capsules (600 mg total) by mouth 3 (three) times daily. 08/10/24  Yes Ulis Bottcher, PA-C  linaclotide  (LINZESS ) 145 MCG CAPS capsule Take 1 capsule (145 mcg total) by mouth daily. 10/14/23  Yes Carlan, Chelsea L, NP  metoprolol  tartrate (LOPRESSOR ) 50 MG tablet Take 1 tablet by mouth twice daily 07/26/24  Yes Del Orbe Polanco, Iliana, FNP  olmesartan  (BENICAR ) 20 MG tablet Take 0.5 tablets (10 mg total) by mouth daily. 08/03/24  Yes Del Orbe Polanco, Hilario, FNP  Omega-3 Fatty Acids (FISH OIL ) 1000 MG CAPS Take 2 capsules (2,000 mg total) by mouth in the morning and at bedtime. 04/05/23  Yes Del Wilhelmena Falter, Hilario, FNP  omeprazole  (PRILOSEC) 40 MG capsule Take 1 capsule (40 mg total) by mouth daily. 12/21/23  Yes Carlan, Chelsea L, NP  ondansetron  (ZOFRAN ) 4 MG tablet TAKE 1 TABLET BY MOUTH EVERY 8 HOURS AS NEEDED FOR NAUSEA OR VOMITING 02/15/24  Yes Carlan, Chelsea L, NP  oxybutynin  (DITROPAN -XL)  10 MG 24 hr tablet Take 10 mg by mouth at bedtime.   Yes [provider]  rosuvastatin  (CRESTOR ) 5 MG tablet Take 1 tablet by mouth once daily 07/25/24  Yes Del Orbe Polanco, [place], FNP  SYMBICORT  160-4.5 MCG/ACT inhaler Inhale 2 puffs into the lungs 2 (two) times daily. 02/23/24  Yes Tamea Dedra CROME, MD  Tiotropium Bromide Monohydrate  (SPIRIVA  RESPIMAT) 2.5 MCG/ACT AERS Inhale 2 puffs into the  lungs daily. 02/23/24  Yes Tamea Dedra CROME, MD  VENTOLIN  HFA 108 (90 Base) MCG/ACT inhaler INHALE 2 PUFFS BY MOUTH EVERY 6 HOURS AS NEEDED FOR WHEEZING OR SHORTNESS OF BREATH 05/13/24  Yes Del Orbe Polanco, Iliana, FNP  vitamin B-12 (CYANOCOBALAMIN ) 1000 MCG tablet Take 1 tablet (1,000 mcg total) by mouth daily. 01/06/20  Yes Moishe Chiquita HERO, NP   Allergies[1]  FAMILY HISTORY:  family history includes COPD in her mother; Emphysema in her mother; Hypercholesterolemia in her father; Hypertension in her father. SOCIAL HISTORY:  reports that she has been smoking cigarettes. She started smoking about 33 years ago. She has a 80.2 pack-year smoking history. She has been exposed to tobacco smoke. She has never used smokeless tobacco. She reports that she does not currently use alcohol. She reports that she does not use drugs.   Review of Systems:  Gen:  Denies  fever, sweats, chills weight loss  HEENT: Denies blurred vision, double vision, ear pain, eye pain, hearing loss, nose bleeds, sore throat Cardiac:  No dizziness, chest pain or heaviness, chest tightness,edema, No JVD Resp:   No cough, -sputum production, -shortness of breath,-wheezing, -hemoptysis,  Gi: Denies swallowing difficulty, stomach pain, nausea or vomiting, diarrhea, constipation, bowel incontinence Gu:  Denies bladder incontinence, burning urine Ext:   Denies Joint pain, stiffness or swelling Skin: Denies  skin rash, easy bruising or bleeding or hives Endoc:  Denies polyuria, polydipsia , polyphagia or weight change Psych:   Denies depression, insomnia or hallucinations  Other:  All other systems negative  VITAL SIGNS: BP 138/88   Pulse 71   Temp 98 F (36.7 C)   Ht 5' 2 (1.575 m)   Wt 236 lb 12.8 oz (107.4 kg)   SpO2 98%   BMI 43.31 kg/m    Physical Examination:   General Appearance: No distress  EYES PERRLA, EOM intact.   NECK Supple, No JVD Pulmonary: normal breath sounds, No wheezing.  CardiovascularNormal  S1,S2.  No m/r/g.   Abdomen: Benign, Soft, non-tender. Skin:   warm, no rashes, no ecchymosis  Extremities: normal, no cyanosis, clubbing. Neuro:without focal findings,  speech normal  PSYCHIATRIC: Mood, affect within normal limits.   ASSESSMENT AND PLAN  OSA Counseled patient on increasing CPAP compliance. Will decrease max pressure to 10 cm H2O. Discussed the consequences of untreated sleep apnea. Advised not to drive drowsy for safety of patient and others. Will follow up in 3 months.    HTN Stable, on current management. Following with PCP.    Patient  satisfied with Plan of action and management. All questions answered  I spent a total of 20 minutes reviewing chart data, face-to-face evaluation with the patient, counseling and coordination of care as detailed above.    Ovie Cornelio, M.D.  Sleep Medicine [facility]           [1]  Allergies Allergen Reactions   Nitrogen Oxide    Other     laughing gas= skin feels like it burning   "

## 2024-08-18 NOTE — Therapy (Signed)
 " OUTPATIENT PHYSICAL THERAPY THORACOLUMBAR EVALUATION   Patient Name: [name] MRN: [id number] DOB:[birth year], 49 y.o., female Today's Date: 08/19/2024  END OF SESSION:  PT End of Session - 08/19/24 1521     Visit Number 1    Date for Recertification  09/30/24    Authorization Type [place] MEDICA[employer] BLUE    Authorization Time Period seeking auth    Progress Note Due on Visit 10    PT Start Time 1415    PT Stop Time 1459    PT Time Calculation (min) 44 min    Activity Tolerance Patient tolerated treatment well;Patient limited by pain    Behavior During Therapy [facility] for tasks assessed/performed          Past Medical History:  Diagnosis Date   Anxiety    [employer] 12/30/2019   Asthma    COPD (chronic obstructive pulmonary disease) (HCC)    Depression    Dyspnea    GERD (gastroesophageal reflux disease)    Hidradenitis suppurativa    History of hiatal hernia    Hyperlipidemia    Hypertension    IBS (irritable bowel syndrome)    Intervertebral disc disorder with myelopathy, cervical region    OAB (overactive bladder)    Tobacco use    Vitamin B 12 deficiency    Vitamin D  deficiency    Past Surgical History:  Procedure Laterality Date   BIOPSY  05/22/2022   Procedure: BIOPSY;  Surgeon: Eartha Angelia Sieving, MD;  [facility];  Service: Gastroenterology;;   BIOPSY  11/26/2022   Procedure: BIOPSY;  Surgeon: Eartha Angelia Sieving, MD;  [facility];  Service: Gastroenterology;;   ORIN MEDIATE RELEASE Right 04/12/2024   Procedure: Right side carpal tunnel release with ultrasound guidance;  Surgeon: Claudene Penne ORN, MD;  Location: [facility];  Service: Neurosurgery;  Laterality: Right;  Right side carpal tunnel release with ultrasound guidance, possible convert to open   CERVICAL ANTERIOR DISC ARTHROPLASTY, 2 LEVEL N/A 11/10/2023   Procedure: CERVICAL ANTERIOR DISC ARTHROPLASTY, 2 LEVEL;  Surgeon: Claudene Penne ORN, MD;  Location: [facility];   Service: Neurosurgery;  Laterality: N/A;   CERVICAL DISC ARTHROPLASTY  11/10/2023   Procedure: CERVICAL ANTERIOR DISC ARTHROPLASTY Levels 5-7;  Surgeon: Claudene Penne ORN, MD;  Location: [facility];  Service: Neurosurgery;;   CHOLECYSTECTOMY     COLONOSCOPY WITH PROPOFOL  N/A 05/22/2022   Procedure: COLONOSCOPY WITH PROPOFOL ;  Surgeon: Eartha Angelia Sieving, MD;  [facility];  Service: Gastroenterology;  Laterality: N/A;  1215 ASA 2   ESOPHAGOGASTRODUODENOSCOPY (EGD) WITH PROPOFOL  N/A 11/26/2022   Procedure: ESOPHAGOGASTRODUODENOSCOPY (EGD) WITH PROPOFOL ;  Surgeon: Eartha Angelia Sieving, MD;  Location: [facility];  Service: Gastroenterology;  Laterality: N/A;  1:00PM;asa 2   POLYPECTOMY  05/22/2022   Procedure: POLYPECTOMY;  Surgeon: Eartha Angelia, Sieving, MD;  Location: [facility];  Service: Gastroenterology;;   TUBAL LIGATION     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/21/2023   Left arm weakness 10/21/2023   Hidradenitis suppurativa 08/05/2023   Depression, major, single episode, moderate (HCC) 08/05/2023   Irritable bowel  syndrome with constipation 06/09/2023   Mixed stress and urge urinary incontinence 04/07/2023   Neck pain 04/01/2023   Early satiety 03/01/2023   Overactive bladder 11/05/2022   Diarrhea 05/05/2022   Loss of weight 05/05/2022   Vitamin B12 deficiency 04/12/2020   Vitamin D  deficiency 04/12/2020   Dizziness 04/12/2020   HTN (hypertension) 12/30/2019   Tobacco abuse 12/30/2019   GERD (gastroesophageal reflux disease) 12/30/2019   Spondylosis 12/30/2019    PCP:  [name], FNP    REFERRING PROVIDER: Claudene Penne ORN, MD  REFERRING DIAG: M54.50,G89.29 (ICD-10-CM) - Chronic midline low back pain without sciatica  Rationale for Evaluation and Treatment: Rehabilitation  THERAPY DIAG:  Other low back pain  Impaired functional mobility, balance, gait, and endurance  ONSET DATE: back got worse after neck surgery  SUBJECTIVE:                                                                                                                                                                                           SUBJECTIVE STATEMENT: Pt states lower back has gotten worse since the neck surgery (April of last year). Pt states neck is still bothering her and has UE numbness still but says the nerves must be too damaged. Pt states now she is also having some lung issues but they are keeping a close eye on it.   PERTINENT HISTORY:  Emphysema/COPD Neck and Back pain  PAIN:  Are you having pain? Yes: NPRS scale: 9/10 Pain location: middle of the back, starting to go down into the hips Pain description: shooting throbbing Aggravating factors: washing dishes, any movement, sit wrong Relieving factors: bending over forward, heat  PRECAUTIONS: None  RED FLAGS: None   WEIGHT BEARING RESTRICTIONS: No  FALLS:  Has patient fallen in last 6 months? No  LIVING ENVIRONMENT: Lives with: lives with their daughter Lives in: House/apartment Stairs: Yes: External: 5 steps; on right going up Has following equipment at home: Environmental Consultant - 2 wheeled  OCCUPATION: unemployed, trying for disability  PLOF: Independent with basic ADLs and Independent with household mobility with device  PATIENT GOALS: decrease low back pain, be able to walk further without back pain  NEXT MD VISIT: after therapy  OBJECTIVE:  Note: Objective measures were completed at Evaluation unless otherwise noted.  DIAGNOSTIC FINDINGS:  CLINICAL DATA:  Flexion and extension  views.  Back pain.   EXAM: LUMBAR SPINE - COMPLETE 4+ VIEW   COMPARISON:  CT abdomen and pelvis 04/20/2023.   FINDINGS: There is no evidence of lumbar spine fracture. Alignment is normal with flexion and extension. Intervertebral disc spaces are maintained. There are  atherosclerotic calcifications of the aorta.   IMPRESSION: 1. No acute findings. 2. Normal alignment with flexion and extension. 3. Aortic Atherosclerosis (ICD10-I70.0).  PATIENT SURVEYS:  Modified Oswestry: 30 / 50 = 60.0 %  COGNITION: Overall cognitive status: short term memory issues reported     SENSATION: Light touch: UEs  POSTURE: rounded shoulders, forward head, posterior pelvic tilt, flexed trunk , and weight shift left  PALPATION: Pt demonstrates increased tenderness to lumbar segments and left lumbar paraspinals.  LUMBAR ROM:   AROM eval  Flexion 50, pain  Extension 10, worse pain  Right lateral flexion 15  Left lateral flexion 10, pain on left side of the back  Right rotation 75% available  Left rotation 50% available, Pain in left side of the back   (Blank rows = not tested)  LOWER EXTREMITY ROM:     Active  Right eval Left eval  Hip flexion    Hip extension    Hip abduction    Hip adduction    Hip internal rotation    Hip external rotation    Knee flexion    Knee extension    Ankle dorsiflexion    Ankle plantarflexion    Ankle inversion    Ankle eversion     (Blank rows = not tested)  LOWER EXTREMITY MMT:    MMT Right eval Left eval  Hip flexion 4+ 4-  Hip extension 4+ 4-  Hip abduction 4+ 4-  Hip adduction    Hip internal rotation    Hip external rotation    Knee flexion    Knee extension    Ankle dorsiflexion 4 3+  Ankle plantarflexion    Ankle inversion    Ankle eversion     (Blank rows = not tested)  LUMBAR SPECIAL TESTS:  Straight leg raise test: Negative  FUNCTIONAL TESTS:  5 times sit to stand: 19.38, no increased pain, SOB noted SLS 08/19/2024: R: 30  seconds L: 15.82 seconds : 298 feet, SOB O2 dropped to 92%  GAIT: Distance walked: 440 Assistive device utilized: None Level of assistance: Modified independence Comments: 1 standing break   TREATMENT DATE:  08/18/2024   Evaluation: -ROM measured, Strength assessed, HEP prescribed, pt educated on prognosis, findings, and importance of HEP compliance if given.                                                                                                                  PATIENT EDUCATION:  Education details: Pt was educated on findings of PT evaluation, prognosis, frequency of therapy visits and rationale, attendance policy, and HEP if given.   Person educated: Patient Education method: Explanation, Verbal cues, and Handouts Education comprehension: verbalized understanding, verbal cues required, and needs further education  HOME EXERCISE PROGRAM: Access Code: [facility] URL: [email or web address] Date: 08/19/2024 Prepared by: Lang Ada  Exercises - Supine Bridge  - 1 x daily - 7 x weekly - 3 sets - 10 reps - Supine Lower Trunk Rotation  - 1 x daily - 7 x  weekly - 3 sets - 10 reps - Sit to Stand  - 1 x daily - 7 x weekly - 3 sets - 10 reps  ASSESSMENT:  CLINICAL IMPRESSION: Patient is a 49 y.o. female who was seen today for physical therapy evaluation and treatment for M54.50,G89.29 (ICD-10-CM) - Chronic midline low back pain without sciatica.   Patient demonstrates increased pain in low back, decreased core and LE strength, decreased endurance and impaired functional mobility. Patient also demonstrates difficulty with ambulation with decreased endurance (one standing rest break), decreased stride length and velocity noted. Patients symptoms likely arising from cardiorespiratory pathology and age related changes of lumbar spine. Patient also demonstrates abnormal tenderness to palpation of lumbar spinal segments L3-L5 as well as to corresponding lumbar  paraspinal musculature on left side. Patient requires education on role of PT, prognosis, thoracolumbar spine anatomy, importance of HEP compliance and overall POC. Patient would benefit from skilled physical therapy for decreased low back pain, increased endurance with ambulation, increased core and proximal LE strength/ROM, and improved balance for improved quality of life, return to higher level of function with ADLs, and progress towards therapy goals.    OBJECTIVE IMPAIRMENTS: Abnormal gait, decreased activity tolerance, decreased balance, decreased endurance, decreased knowledge of condition, decreased knowledge of use of DME, decreased mobility, difficulty walking, decreased ROM, decreased strength, impaired flexibility, and pain.   ACTIVITY LIMITATIONS: carrying, lifting, bending, sitting, standing, squatting, sleeping, stairs, transfers, and bed mobility  PARTICIPATION LIMITATIONS: meal prep, cleaning, laundry, shopping, community activity, and yard work  PERSONAL FACTORS: Age, Fitness, Past/current experiences, and Time since onset of injury/illness/exacerbation are also affecting patient's functional outcome.   REHAB POTENTIAL: Fair chronic in nature  CLINICAL DECISION MAKING: Stable/uncomplicated  EVALUATION COMPLEXITY: Low   GOALS: Goals reviewed with patient? No  SHORT TERM GOALS: Target date: 09/09/24  Pt will be independent with HEP in order to demonstrate participation in Physical Therapy POC.  Baseline: Goal status: INITIAL  2.  Pt will report 7/10 pain with mobility in order to demonstrate improved pain with ADLs.  Baseline: 9/10 Goal status: INITIAL  LONG TERM GOALS: Target date: 09/30/24  Pt will improve 5TSTS by at least 2.3 seconds in order to demonstrate improved functional strength to return to desired activities.  Baseline: see objective.  Goal status: INITIAL  2.  Pt will improve 2 MWT by 40 feet with no breaks in order to demonstrate improved  functional ambulatory capacity in community setting.  Baseline: see objective.  Goal status: INITIAL  3.  Pt will improve Modified Oswestry score by at least 6 points in order to demonstrate improved pain with functional goals and outcomes. Baseline: see objective.  Goal status: INITIAL  4.  Pt will report 5/10 pain with mobility in order to demonstrate reduced pain with ADLs lasting greater than 30 minutes.  Baseline: see objective.  Goal status: INITIAL   PLAN:  PT FREQUENCY: 1-2x/week  PT DURATION: 6 weeks  PLANNED INTERVENTIONS: 97110-Therapeutic exercises, 97530- Therapeutic activity, 97112- Neuromuscular re-education, 97535- Self Care, [id number]- Manual therapy, [phone]- Gait training, [phone]- Electrical stimulation (unattended), [phone]- Electrical stimulation (manual), [phone] (1-2 muscles), [phone] (3+ muscles)- Dry Needling, Patient/Family education, Balance training, Stair training, Joint mobilization, Joint manipulation, Spinal manipulation, Spinal mobilization, DME instructions, Cryotherapy, and Moist heat.  PLAN FOR NEXT SESSION: modalities for low back pain, manual therapy, progress core and LE strengthening, review goals and progress HEP   Lang Ada, PT, DPT [facility] Office: [phone] 3:25 PM, 08/19/24   Managed  Medicaid Authorization Request Treatment Start Date: 09/05/24  Visit Dx Codes: M54.59; Z74.09  Functional Tool Score: Modified Oswestry: 30 / 50 = 60.0 %  For all possible CPT codes, reference the Planned Interventions line above.     Check all conditions that are expected to impact treatment: {Conditions expected to impact treatment:Structural or anatomic abnormalities, Neurological condition and/or seizures, and Unknown   If treatment provided at initial evaluation, no treatment charged due to lack of authorization.     "

## 2024-08-19 ENCOUNTER — Encounter (HOSPITAL_COMMUNITY): Payer: Self-pay

## 2024-08-19 ENCOUNTER — Ambulatory Visit (HOSPITAL_COMMUNITY): Attending: Family Medicine

## 2024-08-19 DIAGNOSIS — M5459 Other low back pain: Secondary | ICD-10-CM

## 2024-08-19 DIAGNOSIS — Z7409 Other reduced mobility: Secondary | ICD-10-CM

## 2024-08-22 ENCOUNTER — Ambulatory Visit: Admitting: Urology

## 2024-08-22 ENCOUNTER — Encounter (HOSPITAL_COMMUNITY)

## 2024-08-23 ENCOUNTER — Ambulatory Visit (HOSPITAL_COMMUNITY)

## 2024-08-24 ENCOUNTER — Encounter (HOSPITAL_COMMUNITY)

## 2024-08-31 ENCOUNTER — Ambulatory Visit (HOSPITAL_COMMUNITY)

## 2024-09-02 ENCOUNTER — Telehealth: Admitting: *Deleted

## 2024-09-02 ENCOUNTER — Ambulatory Visit (HOSPITAL_COMMUNITY)

## 2024-09-07 ENCOUNTER — Ambulatory Visit (HOSPITAL_COMMUNITY)

## 2024-09-09 ENCOUNTER — Ambulatory Visit (HOSPITAL_COMMUNITY)

## 2024-09-13 ENCOUNTER — Ambulatory Visit (HOSPITAL_COMMUNITY): Admitting: Physical Therapy

## 2024-09-15 ENCOUNTER — Ambulatory Visit (HOSPITAL_COMMUNITY)

## 2024-09-19 ENCOUNTER — Ambulatory Visit (INDEPENDENT_AMBULATORY_CARE_PROVIDER_SITE_OTHER): Admitting: Gastroenterology

## 2024-09-22 ENCOUNTER — Ambulatory Visit (HOSPITAL_COMMUNITY)

## 2024-09-29 ENCOUNTER — Ambulatory Visit (HOSPITAL_COMMUNITY)

## 2024-11-10 ENCOUNTER — Ambulatory Visit: Admitting: Pulmonary Disease

## 2024-11-14 ENCOUNTER — Ambulatory Visit: Admitting: Sleep Medicine

## 2024-11-28 ENCOUNTER — Ambulatory Visit (HOSPITAL_COMMUNITY): Admitting: Registered Nurse
